# Patient Record
Sex: Male | Born: 1935
Health system: Southern US, Community
[De-identification: ages and names within clinical notes are randomized; demographics above are authoritative.]

## PROBLEM LIST (undated history)

## (undated) DIAGNOSIS — D509 Iron deficiency anemia, unspecified: Secondary | ICD-10-CM

## (undated) DIAGNOSIS — N400 Enlarged prostate without lower urinary tract symptoms: Secondary | ICD-10-CM

## (undated) DIAGNOSIS — T148XXA Other injury of unspecified body region, initial encounter: Secondary | ICD-10-CM

## (undated) DIAGNOSIS — N183 Chronic kidney disease, stage 3 unspecified: Secondary | ICD-10-CM

## (undated) DIAGNOSIS — E7800 Pure hypercholesterolemia, unspecified: Secondary | ICD-10-CM

## (undated) DIAGNOSIS — Z87442 Personal history of urinary calculi: Secondary | ICD-10-CM

## (undated) DIAGNOSIS — S46912A Strain of unspecified muscle, fascia and tendon at shoulder and upper arm level, left arm, initial encounter: Secondary | ICD-10-CM

## (undated) DIAGNOSIS — N2581 Secondary hyperparathyroidism of renal origin: Secondary | ICD-10-CM

## (undated) DIAGNOSIS — I10 Essential (primary) hypertension: Secondary | ICD-10-CM

---

## 1999-03-03 ENCOUNTER — Ambulatory Visit (HOSPITAL_COMMUNITY): Admission: RE | Admit: 1999-03-03 | Discharge: 1999-03-03 | Payer: Self-pay | Admitting: Gastroenterology

## 2002-01-09 ENCOUNTER — Encounter: Payer: Self-pay | Admitting: Urology

## 2002-01-09 ENCOUNTER — Encounter: Admission: RE | Admit: 2002-01-09 | Discharge: 2002-01-09 | Payer: Self-pay | Admitting: Urology

## 2002-03-20 ENCOUNTER — Encounter: Admission: RE | Admit: 2002-03-20 | Discharge: 2002-03-20 | Payer: Self-pay | Admitting: Gastroenterology

## 2002-03-20 ENCOUNTER — Encounter: Payer: Self-pay | Admitting: Gastroenterology

## 2003-02-07 ENCOUNTER — Ambulatory Visit (HOSPITAL_BASED_OUTPATIENT_CLINIC_OR_DEPARTMENT_OTHER): Admission: RE | Admit: 2003-02-07 | Discharge: 2003-02-07 | Payer: Self-pay | Admitting: Urology

## 2003-02-07 HISTORY — PX: OTHER SURGICAL HISTORY: SHX169

## 2003-02-14 ENCOUNTER — Ambulatory Visit (HOSPITAL_BASED_OUTPATIENT_CLINIC_OR_DEPARTMENT_OTHER): Admission: RE | Admit: 2003-02-14 | Discharge: 2003-02-14 | Payer: Self-pay | Admitting: Urology

## 2003-02-14 HISTORY — PX: OTHER SURGICAL HISTORY: SHX169

## 2003-02-20 ENCOUNTER — Ambulatory Visit (HOSPITAL_COMMUNITY): Admission: RE | Admit: 2003-02-20 | Discharge: 2003-02-20 | Payer: Self-pay | Admitting: Urology

## 2003-02-20 HISTORY — PX: OTHER SURGICAL HISTORY: SHX169

## 2005-03-29 ENCOUNTER — Encounter: Admission: RE | Admit: 2005-03-29 | Discharge: 2005-03-29 | Payer: Self-pay | Admitting: Plastic Surgery

## 2005-04-02 ENCOUNTER — Ambulatory Visit (HOSPITAL_BASED_OUTPATIENT_CLINIC_OR_DEPARTMENT_OTHER): Admission: RE | Admit: 2005-04-02 | Discharge: 2005-04-02 | Payer: Self-pay | Admitting: Plastic Surgery

## 2005-04-02 ENCOUNTER — Ambulatory Visit (HOSPITAL_COMMUNITY): Admission: RE | Admit: 2005-04-02 | Discharge: 2005-04-02 | Payer: Self-pay | Admitting: Plastic Surgery

## 2005-04-02 HISTORY — PX: BLEPHAROPLASTY: SUR158

## 2006-03-28 ENCOUNTER — Emergency Department (HOSPITAL_COMMUNITY): Admission: EM | Admit: 2006-03-28 | Discharge: 2006-03-28 | Payer: Self-pay | Admitting: Emergency Medicine

## 2006-03-31 ENCOUNTER — Encounter (HOSPITAL_COMMUNITY): Admission: RE | Admit: 2006-03-31 | Discharge: 2006-05-19 | Payer: Self-pay | Admitting: Emergency Medicine

## 2007-12-05 ENCOUNTER — Encounter: Admission: RE | Admit: 2007-12-05 | Discharge: 2007-12-05 | Payer: Self-pay | Admitting: Internal Medicine

## 2011-01-22 NOTE — Op Note (Signed)
NAME:  Samuel [name], Samuel [name]                       ACCOUNT NO.:  [id number]   MEDICAL RECORD NO.:  [id number]                   PATIENT TYPE:  AMB   LOCATION:  [facility]                                 FACILITY:  [facility]   PHYSICIAN:  Synthia Innocent, M.D.               DATE OF BIRTH:  Jun 18, 1930   DATE OF PROCEDURE:  DATE OF DISCHARGE:                                 OPERATIVE REPORT   PREOPERATIVE DIAGNOSIS:  A 7-8 mm mid left ureteral calculus.   POSTOPERATIVE DIAGNOSIS:  A 7-8 mm mid left ureteral calculus.   OPERATION/PROCEDURE:  1. Cystoscopy.  2. Left rigid ureteroscopy.  3. Holmium laser lithotripsy, left ureteral stone.   SURGEON:  Synthia Innocent, M.D.   DESCRIPTION OF PROCEDURE:  The patient was prepped and draped in the dorsal  lithotomy position under LMA anesthesia.  Cystoscopy was performed with the  rigid cystoscope.  He had a normal anterior urethra, some early BPH with  kissing lateral lobes for 1 cm.  The bladder itself was somewhat murky in  appearance from his indwelling stent which we grabbed the end of and then  pulled it out the tip of the penis.  I then passed up a 0.038 guide wire and  using fluoroscopic control, it was positioned in the left renal pelvis.  The  double-J stent was removed.  I then backloaded the wire over the  ureteroscope, short rigid ureteroscope, and dilated the ureter some more and  engaged the distal ureter and the stone was located just above the iliac  vessels.  I then backed out the ureteroscope and passed it back around the  guide wire.  There appeared to be a lot of debris floating around in the  distal ureter, some fine gravel and a few blood clots.  Then using the  holmium laser fiber at 2.5-3 watts, lithotripsy was performed on the stone,  breaking it up into fine enough particles that I could pass spontaneously.  The rest of the urethra looked normal distally.   I then backloaded the guide wire over a cystoscope and then passed up  a 6-  French 26 cm double-J stent.  It was positioned in the left renal pelvis.  The distal end was curled up in the bladder.  The bladder was emptied and  all instruments were removed.  I injected 10 mL of Xylocaine jelly per  urethra for anesthetic purposes as well as a B&O suppository for bladder  spasms.   The plan now is to keep the stent in for about a week and then remove it in  the office anticipating that he should be able to pass the gravel  spontaneously.  Synthia Innocent, M.D.    RFS/MEDQ  D:  02/14/2003  T:  02/14/2003  Job:  [id number]

## 2011-01-22 NOTE — Op Note (Signed)
NAME:  Samuel [name], Samuel [name]             ACCOUNT NO.:  [id number]   MEDICAL RECORD NO.:  [id number]          PATIENT TYPE:  AMB   LOCATION:  [place]                          FACILITY:  [facility]   PHYSICIAN:  Crissie Reese, M.D.     DATE OF BIRTH:  05-12-1930   DATE OF PROCEDURE:  04/02/2005  DATE OF DISCHARGE:                                 OPERATIVE REPORT   PREOPERATIVE DIAGNOSIS:  Blepharochalasis with upper visual field  obstruction.   POSTOPERATIVE DIAGNOSIS:  Blepharochalasis with upper visual field  obstruction.   PROCEDURE:  Upper lid blepharoplasty.   SURGEON:  Crissie Reese, M.D.   ANESTHESIA:  MAC with 1% Xylocaine with epinephrine and bicarbonate as a  local anesthetic.   INDICATIONS FOR PROCEDURE:  This 77 year old man has had upper field deficit  documented by his ophthalmologist secondary to excess skin of the upper  lids.  An upper lid blepharoplasty was recommended.  The nature of the  procedure and the possible complications were discussed with him in detail.  He understood those risks and possible complications and wished to proceed.   DESCRIPTION OF PROCEDURE:  The patient was taken to the operating room and  placed supine.  Under sedation he was prepped with Betadine and draped with  sterile drapes.  The incisions were marked staying 7 mm above the lid  margin, and carefully measured for the amount to be removed, using a forceps  to ensure that too much skin was not removed.  After these markings had been  carefully placed, they were rechecked again with a forceps grasping the  redundant skin and good eye closure was noted.  The successful local  anesthesia was achieved and the skin was resected and a thin strip of  orbicularis oculi muscle resected along the superior margin of the incision,  taking great care to stay well away from the inferior margin.  The medial  and middle fat pads were identified and were amputated using electrocautery  with the fat pads elevated  up out of the wound, in order to avoid any damage  to any deep structures.  Meticulous hemostasis was then confirmed using  electrocautery, and it was irrigated thoroughly with BSF solution.  Hemostasis having been confirmed, the closure was with #6-0 Prolene simple  running suture.  Antibiotic ophthalmic ointment and iced eye pads were  applied.   He was transported to the recovery room in stable condition.  He tolerated  the condition well.   DISPOSITION:  We will recheck him next week in the office.      Crissie Reese, M.D.  Electronically Signed     DB/MEDQ  D:  04/02/2005  T:  04/02/2005  Job:  [id number]

## 2011-01-22 NOTE — Op Note (Signed)
NAME:  Samuel [name], SCHIMPF                       ACCOUNT NO.:  [id number]   MEDICAL RECORD NO.:  [id number]                   PATIENT TYPE:  AMB   LOCATION:  DAY                                  FACILITY:  [facility]   PHYSICIAN:  Synthia Innocent, M.D.               DATE OF BIRTH:  08/03/1930   DATE OF PROCEDURE:  02/20/2003  DATE OF DISCHARGE:                                 OPERATIVE REPORT   PREOPERATIVE DIAGNOSES:  1. A 6 mm right ureteral calculus with renal colic.  2. Status post left ureteroscopy and holmium, left ureteral stone and double-     J stent.   POSTOPERATIVE DIAGNOSES:  1. A 6 mm right ureteral calculus with renal colic.  2. Status post left ureteroscopy and holmium, left ureteral stone and double-     J stent.   OPERATION/PROCEDURE:  1. Cystoscopy with balloon dilation, distal right ureter with rigid     ureteroscopy.  2. Holmium laser lithotripsy.  3. Right ureteral calculus and insertion of a 6-French, 26 cm double-J     stent.  4. Removal of left ureteral stent.   SURGEON:  Synthia Innocent, M.D.   DESCRIPTION OF PROCEDURE:  The patient was prepped and draped in the dorsal  lithotomy position under LMA anesthesia.  Cystoscopy was performed the 22-  French rigid cystoscope in normal anterior urethra, trilobar hypertrophy,  normal bladder except for an indwelling left ureteral stent.   Initially I passed up a 0.038 guide wire through a 6-French open-ended  ureteral catheter using fluoroscopic control.  A retrograde study was  performed outlining the collecting system.  I positioned the guide wire in  the kidney, backed out the open-ended catheter and then using a 4 cm  ureteral balloon dilator dilated to nine atmospheres pressure over about  five minutes.  We then backed out the balloon dilator and alongside the  guide wire under direct vision, we passed a 6.5 short rigid ureteroscope  under direct vision.  The stone was located just right off the iliac  vessels.  Then using the 375 microfiber and holmium laser, we broke up the  stone to fine particles.  After I thought we had pulverized the stone enough  where I could pass the gravel, I then backloaded the guide wire over a  cystoscope and under direct vision passed a 6-French, 26 cm double-J stent.  It was positioned in the kidney.  The distal end was curled up in the  bladder using fluoroscopic control.  I then removed the left ureteral double-  J stent without incident.  I emptied the bladder.  A B&O suppository was  placed per rectum for anesthetic purposes and the patient was then taken to  back to the recovery room in satisfactory condition.  During the procedure,  appropriate pictures were taken with fluoroscopy screen and the video  camera.  Synthia Innocent, M.D.    RFS/MEDQ  D:  02/20/2003  T:  02/20/2003  Job:  [id number]

## 2011-01-22 NOTE — Op Note (Signed)
   NAME:  Samuel [name], Samuel [name]                       ACCOUNT NO.:  [id number]   MEDICAL RECORD NO.:  [id number]                   PATIENT TYPE:  AMB   LOCATION:  [facility]                                 FACILITY:  [facility]   PHYSICIAN:  Synthia Innocent, M.D.               DATE OF BIRTH:  [birth year]   DATE OF PROCEDURE:  02/07/2003  DATE OF DISCHARGE:                                 OPERATIVE REPORT   PREOPERATIVE DIAGNOSIS:  Two left ureteral calculi with renal colic and  hydronephrosis.   POSTOPERATIVE DIAGNOSIS:  Two left ureteral calculi with renal colic and  hydronephrosis.   OPERATION:  Cystoscopy and insertion of a 6 [place], 26 cm double J stent.   SURGEON:  Synthia Innocent, M.D.   DESCRIPTION OF PROCEDURE:  The patient was prepped and draped in the dorsal  lithotomy position under LMA anesthesia. Cystoscopy was performed with a 22  French rigid cystoscope and 12 degree lens. He had a normal anterior  urethra, trilobar hypertrophy with a rather prominent median lobe,  otherwise, a normal smooth bladder, no tumors. There appeared to be some  fine gravel floating around the bladder. The orifices themselves were  normal.   Initially after using the 6 French open ended ureteral catheter and a 0.038  Glidewire, I was able to negotiate by two known distal left ureteral stones.  There was a 4 mm stone on CT scan yesterday and a 7 mm stone over the iliac  vessels. However, because of some difficulty negotiating the Glidewire by  these areas once I got the Glidewire up to the kidney and rather than risk  loosing accessing, I just went ahead and put up a 6 French 26 cm double J  stent using fluoroscopic control. The proximal end was curled up in the  kidney, the distal end was curled up in the bladder. The bladder was  emptied, all instruments were removed and I put in 1% Xylocaine jelly per  urethra for anesthetic purposes. The patient was then taken back to the  recovery room in  satisfactory condition.   The plan now is to perform lithotripsy if we can see the stone next week, if  not I would bring him back in about a week once the ureter has dilated and  try to remove both stones at the same time with the ureteroscope. The  patient understands the proposed surgery.                                               Synthia Innocent, M.D.    RFS/MEDQ  D:  02/07/2003  T:  02/07/2003  Job:  [id number]

## 2011-10-19 DIAGNOSIS — D509 Iron deficiency anemia, unspecified: Secondary | ICD-10-CM | POA: Diagnosis not present

## 2011-10-19 DIAGNOSIS — N2581 Secondary hyperparathyroidism of renal origin: Secondary | ICD-10-CM | POA: Diagnosis not present

## 2011-10-19 DIAGNOSIS — I10 Essential (primary) hypertension: Secondary | ICD-10-CM | POA: Diagnosis not present

## 2011-12-23 DIAGNOSIS — Z961 Presence of intraocular lens: Secondary | ICD-10-CM | POA: Diagnosis not present

## 2011-12-23 DIAGNOSIS — E109 Type 1 diabetes mellitus without complications: Secondary | ICD-10-CM | POA: Diagnosis not present

## 2012-01-10 DIAGNOSIS — D509 Iron deficiency anemia, unspecified: Secondary | ICD-10-CM | POA: Diagnosis not present

## 2012-01-10 DIAGNOSIS — N2581 Secondary hyperparathyroidism of renal origin: Secondary | ICD-10-CM | POA: Diagnosis not present

## 2012-01-10 DIAGNOSIS — I10 Essential (primary) hypertension: Secondary | ICD-10-CM | POA: Diagnosis not present

## 2012-01-19 DIAGNOSIS — D509 Iron deficiency anemia, unspecified: Secondary | ICD-10-CM | POA: Diagnosis not present

## 2012-01-19 DIAGNOSIS — N2581 Secondary hyperparathyroidism of renal origin: Secondary | ICD-10-CM | POA: Diagnosis not present

## 2012-01-19 DIAGNOSIS — I10 Essential (primary) hypertension: Secondary | ICD-10-CM | POA: Diagnosis not present

## 2012-02-02 DIAGNOSIS — I10 Essential (primary) hypertension: Secondary | ICD-10-CM | POA: Diagnosis not present

## 2012-02-02 DIAGNOSIS — E785 Hyperlipidemia, unspecified: Secondary | ICD-10-CM | POA: Diagnosis not present

## 2012-02-02 DIAGNOSIS — E1129 Type 2 diabetes mellitus with other diabetic kidney complication: Secondary | ICD-10-CM | POA: Diagnosis not present

## 2012-02-02 DIAGNOSIS — Z125 Encounter for screening for malignant neoplasm of prostate: Secondary | ICD-10-CM | POA: Diagnosis not present

## 2012-02-07 DIAGNOSIS — I10 Essential (primary) hypertension: Secondary | ICD-10-CM | POA: Diagnosis not present

## 2012-02-07 DIAGNOSIS — Z0000 Encounter for general adult medical examination without abnormal findings: Secondary | ICD-10-CM | POA: Diagnosis not present

## 2012-02-07 DIAGNOSIS — N039 Chronic nephritic syndrome with unspecified morphologic changes: Secondary | ICD-10-CM | POA: Diagnosis not present

## 2012-02-07 DIAGNOSIS — E1129 Type 2 diabetes mellitus with other diabetic kidney complication: Secondary | ICD-10-CM | POA: Diagnosis not present

## 2012-02-07 DIAGNOSIS — Z125 Encounter for screening for malignant neoplasm of prostate: Secondary | ICD-10-CM | POA: Diagnosis not present

## 2012-02-07 DIAGNOSIS — E785 Hyperlipidemia, unspecified: Secondary | ICD-10-CM | POA: Diagnosis not present

## 2012-02-09 DIAGNOSIS — Z1212 Encounter for screening for malignant neoplasm of rectum: Secondary | ICD-10-CM | POA: Diagnosis not present

## 2012-03-15 DIAGNOSIS — N529 Male erectile dysfunction, unspecified: Secondary | ICD-10-CM | POA: Diagnosis not present

## 2012-03-30 ENCOUNTER — Encounter (HOSPITAL_COMMUNITY): Payer: Self-pay | Admitting: *Deleted

## 2012-03-30 ENCOUNTER — Emergency Department (HOSPITAL_COMMUNITY): Payer: No Typology Code available for payment source

## 2012-03-30 ENCOUNTER — Emergency Department (HOSPITAL_COMMUNITY)
Admission: EM | Admit: 2012-03-30 | Discharge: 2012-03-30 | Disposition: A | Discharge status: Home / Self Care | Payer: No Typology Code available for payment source | Attending: Emergency Medicine | Admitting: Emergency Medicine

## 2012-03-30 DIAGNOSIS — E7800 Pure hypercholesterolemia, unspecified: Secondary | ICD-10-CM | POA: Insufficient documentation

## 2012-03-30 DIAGNOSIS — S39012A Strain of muscle, fascia and tendon of lower back, initial encounter: Secondary | ICD-10-CM

## 2012-03-30 DIAGNOSIS — Z87891 Personal history of nicotine dependence: Secondary | ICD-10-CM | POA: Insufficient documentation

## 2012-03-30 DIAGNOSIS — Y998 Other external cause status: Secondary | ICD-10-CM | POA: Insufficient documentation

## 2012-03-30 DIAGNOSIS — S335XXA Sprain of ligaments of lumbar spine, initial encounter: Secondary | ICD-10-CM | POA: Diagnosis not present

## 2012-03-30 DIAGNOSIS — Y93I9 Activity, other involving external motion: Secondary | ICD-10-CM | POA: Insufficient documentation

## 2012-03-30 DIAGNOSIS — I10 Essential (primary) hypertension: Secondary | ICD-10-CM | POA: Insufficient documentation

## 2012-03-30 DIAGNOSIS — M549 Dorsalgia, unspecified: Secondary | ICD-10-CM | POA: Diagnosis not present

## 2012-03-30 DIAGNOSIS — E119 Type 2 diabetes mellitus without complications: Secondary | ICD-10-CM | POA: Insufficient documentation

## 2012-03-30 HISTORY — DX: Pure hypercholesterolemia, unspecified: E78.00

## 2012-03-30 HISTORY — DX: Essential (primary) hypertension: I10

## 2012-03-30 MED ORDER — TRAMADOL HCL 50 MG PO TABS
50.0000 mg | ORAL_TABLET | Freq: Four times a day (QID) | ORAL | Status: AC | PRN
  Administered 2012-03-30: 50 mg via ORAL
  Filled 2012-03-30: qty 1

## 2012-03-30 MED ORDER — TRAMADOL HCL 50 MG PO TABS: 50.0000 mg | ORAL_TABLET | Freq: Four times a day (QID) | ORAL | Status: AC | PRN

## 2012-03-30 NOTE — ED Notes (Signed)
Patient transported to X-ray 
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
## 2012-03-30 NOTE — ED Notes (Signed)
Pt. returned from XR. 

## 2012-04-04 DIAGNOSIS — R39198 Other difficulties with micturition: Secondary | ICD-10-CM | POA: Diagnosis not present

## 2012-04-04 DIAGNOSIS — N3941 Urge incontinence: Secondary | ICD-10-CM | POA: Diagnosis not present

## 2012-04-26 DIAGNOSIS — N401 Enlarged prostate with lower urinary tract symptoms: Secondary | ICD-10-CM | POA: Diagnosis not present

## 2012-04-26 DIAGNOSIS — N529 Male erectile dysfunction, unspecified: Secondary | ICD-10-CM | POA: Diagnosis not present

## 2012-06-07 DIAGNOSIS — Z23 Encounter for immunization: Secondary | ICD-10-CM | POA: Diagnosis not present

## 2012-06-09 DIAGNOSIS — M7500 Adhesive capsulitis of unspecified shoulder: Secondary | ICD-10-CM | POA: Diagnosis not present

## 2012-06-09 DIAGNOSIS — M545 Low back pain: Secondary | ICD-10-CM | POA: Diagnosis not present

## 2012-06-09 DIAGNOSIS — M542 Cervicalgia: Secondary | ICD-10-CM | POA: Diagnosis not present

## 2012-06-19 DIAGNOSIS — M47812 Spondylosis without myelopathy or radiculopathy, cervical region: Secondary | ICD-10-CM | POA: Diagnosis not present

## 2012-06-19 DIAGNOSIS — M19019 Primary osteoarthritis, unspecified shoulder: Secondary | ICD-10-CM | POA: Diagnosis not present

## 2012-06-23 DIAGNOSIS — M545 Low back pain: Secondary | ICD-10-CM | POA: Diagnosis not present

## 2012-06-23 DIAGNOSIS — M7500 Adhesive capsulitis of unspecified shoulder: Secondary | ICD-10-CM | POA: Diagnosis not present

## 2012-06-23 DIAGNOSIS — M542 Cervicalgia: Secondary | ICD-10-CM | POA: Diagnosis not present

## 2012-07-17 DIAGNOSIS — M109 Gout, unspecified: Secondary | ICD-10-CM | POA: Diagnosis not present

## 2012-07-17 DIAGNOSIS — E1129 Type 2 diabetes mellitus with other diabetic kidney complication: Secondary | ICD-10-CM | POA: Diagnosis not present

## 2012-07-17 DIAGNOSIS — M25579 Pain in unspecified ankle and joints of unspecified foot: Secondary | ICD-10-CM | POA: Diagnosis not present

## 2012-07-27 DIAGNOSIS — M7500 Adhesive capsulitis of unspecified shoulder: Secondary | ICD-10-CM | POA: Diagnosis not present

## 2012-07-27 DIAGNOSIS — M542 Cervicalgia: Secondary | ICD-10-CM | POA: Diagnosis not present

## 2012-08-09 DIAGNOSIS — N2581 Secondary hyperparathyroidism of renal origin: Secondary | ICD-10-CM | POA: Diagnosis not present

## 2012-08-09 DIAGNOSIS — D509 Iron deficiency anemia, unspecified: Secondary | ICD-10-CM | POA: Diagnosis not present

## 2012-08-09 DIAGNOSIS — I10 Essential (primary) hypertension: Secondary | ICD-10-CM | POA: Diagnosis not present

## 2012-08-14 DIAGNOSIS — E1129 Type 2 diabetes mellitus with other diabetic kidney complication: Secondary | ICD-10-CM | POA: Diagnosis not present

## 2012-08-14 DIAGNOSIS — M109 Gout, unspecified: Secondary | ICD-10-CM | POA: Diagnosis not present

## 2012-08-14 DIAGNOSIS — Z1331 Encounter for screening for depression: Secondary | ICD-10-CM | POA: Diagnosis not present

## 2012-08-14 DIAGNOSIS — E11319 Type 2 diabetes mellitus with unspecified diabetic retinopathy without macular edema: Secondary | ICD-10-CM | POA: Diagnosis not present

## 2012-08-14 DIAGNOSIS — D631 Anemia in chronic kidney disease: Secondary | ICD-10-CM | POA: Diagnosis not present

## 2012-08-14 DIAGNOSIS — E785 Hyperlipidemia, unspecified: Secondary | ICD-10-CM | POA: Diagnosis not present

## 2012-08-14 DIAGNOSIS — N039 Chronic nephritic syndrome with unspecified morphologic changes: Secondary | ICD-10-CM | POA: Diagnosis not present

## 2012-08-14 DIAGNOSIS — I10 Essential (primary) hypertension: Secondary | ICD-10-CM | POA: Diagnosis not present

## 2012-08-15 DIAGNOSIS — M999 Biomechanical lesion, unspecified: Secondary | ICD-10-CM | POA: Diagnosis not present

## 2012-08-15 DIAGNOSIS — M542 Cervicalgia: Secondary | ICD-10-CM | POA: Diagnosis not present

## 2012-08-15 DIAGNOSIS — M546 Pain in thoracic spine: Secondary | ICD-10-CM | POA: Diagnosis not present

## 2012-08-15 DIAGNOSIS — M9981 Other biomechanical lesions of cervical region: Secondary | ICD-10-CM | POA: Diagnosis not present

## 2012-08-22 DIAGNOSIS — M999 Biomechanical lesion, unspecified: Secondary | ICD-10-CM | POA: Diagnosis not present

## 2012-08-22 DIAGNOSIS — M9981 Other biomechanical lesions of cervical region: Secondary | ICD-10-CM | POA: Diagnosis not present

## 2012-08-22 DIAGNOSIS — S139XXA Sprain of joints and ligaments of unspecified parts of neck, initial encounter: Secondary | ICD-10-CM | POA: Diagnosis not present

## 2012-08-22 DIAGNOSIS — S335XXA Sprain of ligaments of lumbar spine, initial encounter: Secondary | ICD-10-CM | POA: Diagnosis not present

## 2012-08-28 DIAGNOSIS — M9981 Other biomechanical lesions of cervical region: Secondary | ICD-10-CM | POA: Diagnosis not present

## 2012-08-28 DIAGNOSIS — M999 Biomechanical lesion, unspecified: Secondary | ICD-10-CM | POA: Diagnosis not present

## 2012-08-28 DIAGNOSIS — S335XXA Sprain of ligaments of lumbar spine, initial encounter: Secondary | ICD-10-CM | POA: Diagnosis not present

## 2012-08-28 DIAGNOSIS — S139XXA Sprain of joints and ligaments of unspecified parts of neck, initial encounter: Secondary | ICD-10-CM | POA: Diagnosis not present

## 2012-09-04 DIAGNOSIS — S335XXA Sprain of ligaments of lumbar spine, initial encounter: Secondary | ICD-10-CM | POA: Diagnosis not present

## 2012-09-04 DIAGNOSIS — M9981 Other biomechanical lesions of cervical region: Secondary | ICD-10-CM | POA: Diagnosis not present

## 2012-09-04 DIAGNOSIS — S139XXA Sprain of joints and ligaments of unspecified parts of neck, initial encounter: Secondary | ICD-10-CM | POA: Diagnosis not present

## 2012-09-04 DIAGNOSIS — M999 Biomechanical lesion, unspecified: Secondary | ICD-10-CM | POA: Diagnosis not present

## 2012-09-07 DIAGNOSIS — M9981 Other biomechanical lesions of cervical region: Secondary | ICD-10-CM | POA: Diagnosis not present

## 2012-09-07 DIAGNOSIS — S335XXA Sprain of ligaments of lumbar spine, initial encounter: Secondary | ICD-10-CM | POA: Diagnosis not present

## 2012-09-07 DIAGNOSIS — M999 Biomechanical lesion, unspecified: Secondary | ICD-10-CM | POA: Diagnosis not present

## 2012-09-07 DIAGNOSIS — S139XXA Sprain of joints and ligaments of unspecified parts of neck, initial encounter: Secondary | ICD-10-CM | POA: Diagnosis not present

## 2012-09-12 DIAGNOSIS — S335XXA Sprain of ligaments of lumbar spine, initial encounter: Secondary | ICD-10-CM | POA: Diagnosis not present

## 2012-09-12 DIAGNOSIS — M9981 Other biomechanical lesions of cervical region: Secondary | ICD-10-CM | POA: Diagnosis not present

## 2012-09-12 DIAGNOSIS — S139XXA Sprain of joints and ligaments of unspecified parts of neck, initial encounter: Secondary | ICD-10-CM | POA: Diagnosis not present

## 2012-09-12 DIAGNOSIS — M999 Biomechanical lesion, unspecified: Secondary | ICD-10-CM | POA: Diagnosis not present

## 2012-09-14 DIAGNOSIS — M9981 Other biomechanical lesions of cervical region: Secondary | ICD-10-CM | POA: Diagnosis not present

## 2012-09-14 DIAGNOSIS — S139XXA Sprain of joints and ligaments of unspecified parts of neck, initial encounter: Secondary | ICD-10-CM | POA: Diagnosis not present

## 2012-09-14 DIAGNOSIS — S335XXA Sprain of ligaments of lumbar spine, initial encounter: Secondary | ICD-10-CM | POA: Diagnosis not present

## 2012-09-14 DIAGNOSIS — M999 Biomechanical lesion, unspecified: Secondary | ICD-10-CM | POA: Diagnosis not present

## 2012-09-19 DIAGNOSIS — S335XXA Sprain of ligaments of lumbar spine, initial encounter: Secondary | ICD-10-CM | POA: Diagnosis not present

## 2012-09-19 DIAGNOSIS — M9981 Other biomechanical lesions of cervical region: Secondary | ICD-10-CM | POA: Diagnosis not present

## 2012-09-19 DIAGNOSIS — M999 Biomechanical lesion, unspecified: Secondary | ICD-10-CM | POA: Diagnosis not present

## 2012-09-19 DIAGNOSIS — S139XXA Sprain of joints and ligaments of unspecified parts of neck, initial encounter: Secondary | ICD-10-CM | POA: Diagnosis not present

## 2012-09-21 DIAGNOSIS — M9981 Other biomechanical lesions of cervical region: Secondary | ICD-10-CM | POA: Diagnosis not present

## 2012-09-21 DIAGNOSIS — M999 Biomechanical lesion, unspecified: Secondary | ICD-10-CM | POA: Diagnosis not present

## 2012-09-21 DIAGNOSIS — S335XXA Sprain of ligaments of lumbar spine, initial encounter: Secondary | ICD-10-CM | POA: Diagnosis not present

## 2012-09-21 DIAGNOSIS — S139XXA Sprain of joints and ligaments of unspecified parts of neck, initial encounter: Secondary | ICD-10-CM | POA: Diagnosis not present

## 2012-09-26 DIAGNOSIS — M9981 Other biomechanical lesions of cervical region: Secondary | ICD-10-CM | POA: Diagnosis not present

## 2012-09-26 DIAGNOSIS — S139XXA Sprain of joints and ligaments of unspecified parts of neck, initial encounter: Secondary | ICD-10-CM | POA: Diagnosis not present

## 2012-09-26 DIAGNOSIS — S335XXA Sprain of ligaments of lumbar spine, initial encounter: Secondary | ICD-10-CM | POA: Diagnosis not present

## 2012-09-26 DIAGNOSIS — M999 Biomechanical lesion, unspecified: Secondary | ICD-10-CM | POA: Diagnosis not present

## 2012-09-28 DIAGNOSIS — M999 Biomechanical lesion, unspecified: Secondary | ICD-10-CM | POA: Diagnosis not present

## 2012-09-28 DIAGNOSIS — S335XXA Sprain of ligaments of lumbar spine, initial encounter: Secondary | ICD-10-CM | POA: Diagnosis not present

## 2012-09-28 DIAGNOSIS — S139XXA Sprain of joints and ligaments of unspecified parts of neck, initial encounter: Secondary | ICD-10-CM | POA: Diagnosis not present

## 2012-09-28 DIAGNOSIS — M9981 Other biomechanical lesions of cervical region: Secondary | ICD-10-CM | POA: Diagnosis not present

## 2012-10-10 DIAGNOSIS — M999 Biomechanical lesion, unspecified: Secondary | ICD-10-CM | POA: Diagnosis not present

## 2012-10-10 DIAGNOSIS — S139XXA Sprain of joints and ligaments of unspecified parts of neck, initial encounter: Secondary | ICD-10-CM | POA: Diagnosis not present

## 2012-10-10 DIAGNOSIS — M9981 Other biomechanical lesions of cervical region: Secondary | ICD-10-CM | POA: Diagnosis not present

## 2012-10-10 DIAGNOSIS — S335XXA Sprain of ligaments of lumbar spine, initial encounter: Secondary | ICD-10-CM | POA: Diagnosis not present

## 2012-10-12 DIAGNOSIS — S335XXA Sprain of ligaments of lumbar spine, initial encounter: Secondary | ICD-10-CM | POA: Diagnosis not present

## 2012-10-12 DIAGNOSIS — M9981 Other biomechanical lesions of cervical region: Secondary | ICD-10-CM | POA: Diagnosis not present

## 2012-10-12 DIAGNOSIS — M999 Biomechanical lesion, unspecified: Secondary | ICD-10-CM | POA: Diagnosis not present

## 2012-10-12 DIAGNOSIS — S139XXA Sprain of joints and ligaments of unspecified parts of neck, initial encounter: Secondary | ICD-10-CM | POA: Diagnosis not present

## 2012-10-20 DIAGNOSIS — M999 Biomechanical lesion, unspecified: Secondary | ICD-10-CM | POA: Diagnosis not present

## 2012-10-20 DIAGNOSIS — M9981 Other biomechanical lesions of cervical region: Secondary | ICD-10-CM | POA: Diagnosis not present

## 2012-10-20 DIAGNOSIS — S335XXA Sprain of ligaments of lumbar spine, initial encounter: Secondary | ICD-10-CM | POA: Diagnosis not present

## 2012-10-20 DIAGNOSIS — S139XXA Sprain of joints and ligaments of unspecified parts of neck, initial encounter: Secondary | ICD-10-CM | POA: Diagnosis not present

## 2012-10-25 DIAGNOSIS — N401 Enlarged prostate with lower urinary tract symptoms: Secondary | ICD-10-CM | POA: Diagnosis not present

## 2012-10-25 DIAGNOSIS — M999 Biomechanical lesion, unspecified: Secondary | ICD-10-CM | POA: Diagnosis not present

## 2012-10-25 DIAGNOSIS — M542 Cervicalgia: Secondary | ICD-10-CM | POA: Diagnosis not present

## 2012-10-25 DIAGNOSIS — M9981 Other biomechanical lesions of cervical region: Secondary | ICD-10-CM | POA: Diagnosis not present

## 2012-10-25 DIAGNOSIS — N320 Bladder-neck obstruction: Secondary | ICD-10-CM | POA: Diagnosis not present

## 2012-10-25 DIAGNOSIS — I10 Essential (primary) hypertension: Secondary | ICD-10-CM | POA: Diagnosis not present

## 2012-10-27 ENCOUNTER — Other Ambulatory Visit: Payer: Self-pay | Admitting: Urology

## 2012-11-01 DIAGNOSIS — M999 Biomechanical lesion, unspecified: Secondary | ICD-10-CM | POA: Diagnosis not present

## 2012-11-01 DIAGNOSIS — S139XXA Sprain of joints and ligaments of unspecified parts of neck, initial encounter: Secondary | ICD-10-CM | POA: Diagnosis not present

## 2012-11-01 DIAGNOSIS — S335XXA Sprain of ligaments of lumbar spine, initial encounter: Secondary | ICD-10-CM | POA: Diagnosis not present

## 2012-11-01 DIAGNOSIS — M9981 Other biomechanical lesions of cervical region: Secondary | ICD-10-CM | POA: Diagnosis not present

## 2012-11-06 ENCOUNTER — Emergency Department (HOSPITAL_COMMUNITY): Payer: Medicare Other

## 2012-11-06 ENCOUNTER — Encounter (HOSPITAL_COMMUNITY): Payer: Self-pay | Admitting: Emergency Medicine

## 2012-11-06 ENCOUNTER — Emergency Department (HOSPITAL_COMMUNITY)
Admission: EM | Admit: 2012-11-06 | Discharge: 2012-11-06 | Disposition: A | Discharge status: Home / Self Care | Payer: Medicare Other | Attending: Emergency Medicine | Admitting: Emergency Medicine

## 2012-11-06 DIAGNOSIS — N289 Disorder of kidney and ureter, unspecified: Secondary | ICD-10-CM | POA: Insufficient documentation

## 2012-11-06 DIAGNOSIS — I10 Essential (primary) hypertension: Secondary | ICD-10-CM | POA: Diagnosis not present

## 2012-11-06 DIAGNOSIS — S46912A Strain of unspecified muscle, fascia and tendon at shoulder and upper arm level, left arm, initial encounter: Secondary | ICD-10-CM

## 2012-11-06 DIAGNOSIS — E119 Type 2 diabetes mellitus without complications: Secondary | ICD-10-CM | POA: Diagnosis not present

## 2012-11-06 DIAGNOSIS — W108XXA Fall (on) (from) other stairs and steps, initial encounter: Secondary | ICD-10-CM | POA: Insufficient documentation

## 2012-11-06 DIAGNOSIS — Z7982 Long term (current) use of aspirin: Secondary | ICD-10-CM | POA: Diagnosis not present

## 2012-11-06 DIAGNOSIS — M542 Cervicalgia: Secondary | ICD-10-CM | POA: Diagnosis not present

## 2012-11-06 DIAGNOSIS — R079 Chest pain, unspecified: Secondary | ICD-10-CM | POA: Insufficient documentation

## 2012-11-06 DIAGNOSIS — Y92009 Unspecified place in unspecified non-institutional (private) residence as the place of occurrence of the external cause: Secondary | ICD-10-CM | POA: Insufficient documentation

## 2012-11-06 DIAGNOSIS — Z794 Long term (current) use of insulin: Secondary | ICD-10-CM | POA: Insufficient documentation

## 2012-11-06 DIAGNOSIS — S0993XA Unspecified injury of face, initial encounter: Secondary | ICD-10-CM | POA: Diagnosis not present

## 2012-11-06 DIAGNOSIS — S298XXA Other specified injuries of thorax, initial encounter: Secondary | ICD-10-CM | POA: Diagnosis not present

## 2012-11-06 DIAGNOSIS — E7800 Pure hypercholesterolemia, unspecified: Secondary | ICD-10-CM | POA: Diagnosis not present

## 2012-11-06 DIAGNOSIS — Z87891 Personal history of nicotine dependence: Secondary | ICD-10-CM | POA: Insufficient documentation

## 2012-11-06 DIAGNOSIS — S4980XA Other specified injuries of shoulder and upper arm, unspecified arm, initial encounter: Secondary | ICD-10-CM | POA: Diagnosis not present

## 2012-11-06 DIAGNOSIS — S0990XA Unspecified injury of head, initial encounter: Secondary | ICD-10-CM | POA: Diagnosis not present

## 2012-11-06 DIAGNOSIS — S199XXA Unspecified injury of neck, initial encounter: Secondary | ICD-10-CM | POA: Diagnosis not present

## 2012-11-06 DIAGNOSIS — W19XXXA Unspecified fall, initial encounter: Secondary | ICD-10-CM

## 2012-11-06 DIAGNOSIS — M25519 Pain in unspecified shoulder: Secondary | ICD-10-CM | POA: Diagnosis not present

## 2012-11-06 DIAGNOSIS — IMO0002 Reserved for concepts with insufficient information to code with codable children: Secondary | ICD-10-CM | POA: Insufficient documentation

## 2012-11-06 DIAGNOSIS — W010XXA Fall on same level from slipping, tripping and stumbling without subsequent striking against object, initial encounter: Secondary | ICD-10-CM | POA: Insufficient documentation

## 2012-11-06 DIAGNOSIS — R51 Headache: Secondary | ICD-10-CM | POA: Diagnosis not present

## 2012-11-06 DIAGNOSIS — Z79899 Other long term (current) drug therapy: Secondary | ICD-10-CM | POA: Insufficient documentation

## 2012-11-06 DIAGNOSIS — Y9389 Activity, other specified: Secondary | ICD-10-CM | POA: Insufficient documentation

## 2012-11-06 LAB — POCT I-STAT, CHEM 8
Creatinine, Ser: 2.2 mg/dL — ABNORMAL HIGH (ref 0.50–1.35)
Hemoglobin: 11.6 g/dL — ABNORMAL LOW (ref 13.0–17.0)
Sodium: 143 mEq/L (ref 135–145)
TCO2: 25 mmol/L (ref 0–100)

## 2012-11-06 MED ORDER — DIAZEPAM 5 MG PO TABS: 5.0000 mg | ORAL_TABLET | Freq: Two times a day (BID) | ORAL | Status: DC | PRN

## 2012-11-06 MED ORDER — FENTANYL CITRATE 0.05 MG/ML IJ SOLN
25.0000 ug | Freq: Once | INTRAMUSCULAR | Status: AC
  Administered 2012-11-06: 25 ug via INTRAVENOUS
  Filled 2012-11-06: qty 2

## 2012-11-06 MED ORDER — HYDROCODONE-ACETAMINOPHEN 5-325 MG PO TABS: 1.0000 | ORAL_TABLET | Freq: Four times a day (QID) | ORAL | Status: DC | PRN

## 2012-11-06 MED ORDER — ONDANSETRON HCL 4 MG/2ML IJ SOLN
4.0000 mg | Freq: Once | INTRAMUSCULAR | Status: AC
  Administered 2012-11-06: 4 mg via INTRAVENOUS
  Filled 2012-11-06: qty 2

## 2012-11-06 NOTE — ED Notes (Signed)
Patient requesting to hold second dose of pain medication as pain has subsided to a level 5 and he feels better.
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
## 2012-11-06 NOTE — ED Notes (Signed)
Patient to xray.

## 2012-11-06 NOTE — ED Provider Notes (Addendum)
History     CSN: [id number]  Arrival date & time 11/06/12  1454   First MD Initiated Contact with Patient 11/06/12 1518      Chief Complaint  Patient presents with  . Fall  . Shoulder Pain    (Consider location/radiation/quality/duration/timing/severity/associated sxs/prior treatment) Patient is a 79 y.o. male presenting with fall and shoulder pain. The history is provided by the patient.  Fall The accident occurred 1 to 2 hours ago. The fall occurred while walking (He was walking down his front porch and slipped on ice causing him to fall down 3 steps). He landed on concrete. The point of impact was the left shoulder. The pain is present in the left shoulder. The pain is at a severity of 9/10. The pain is severe. He was ambulatory at the scene. Pertinent negatives include no visual change, no abdominal pain, no nausea, no vomiting, no headaches, no loss of consciousness and no tingling. Associated symptoms comments: Patient is describing pain in the left side of his back where his ribs attached to his spine. The symptoms are aggravated by activity, use of the injured limb and pressure on the injury. He has tried nothing for the symptoms. The treatment provided no relief.  Shoulder Pain Pertinent negatives include no abdominal pain, no headaches and no shortness of breath.    Past Medical History  Diagnosis Date  . Diabetes mellitus   . Hypertension   . Hypercholesteremia   . Renal disorder     chronic kidney disease    History reviewed. No pertinent past surgical history.  No family history on file.  History  Substance Use Topics  . Smoking status: Former Research scientist (life sciences)  . Smokeless tobacco: Not on file  . Alcohol Use: No      Review of Systems  Respiratory: Negative for cough and shortness of breath.   Gastrointestinal: Negative for nausea, vomiting and abdominal pain.  Neurological: Negative for tingling, loss of consciousness, weakness and headaches.  All other systems  reviewed and are negative.    Allergies  Codeine and Penicillins  Home Medications   Current Outpatient Rx  Name  Route  Sig  Dispense  Refill  . allopurinol (ZYLOPRIM) 100 MG tablet   Oral   Take 100 mg by mouth every evening.         [name] amLODipine (NORVASC) 10 MG tablet   Oral   Take 10 mg by mouth every evening.          [name] aspirin EC 81 MG tablet   Oral   Take 81 mg by mouth every evening.          [name] atorvastatin (LIPITOR) 20 MG tablet   Oral   Take 20 mg by mouth every evening.          . calcitRIOL (ROCALTROL) 0.25 MCG capsule   Oral   Take 0.25 mcg by mouth every Monday, Wednesday, and Friday.          . dutasteride (AVODART) 0.5 MG capsule   Oral   Take 0.5 mg by mouth every morning.          . ferrous sulfate 325 (65 FE) MG tablet   Oral   Take 325 mg by mouth every morning.          . fish oil-omega-3 fatty acids 1000 MG capsule   Oral   Take 1 g by mouth every evening.          . furosemide (LASIX) 20  MG tablet   Oral   Take 20 mg by mouth every morning.          [name] glimepiride (AMARYL) 4 MG tablet   Oral   Take 4 mg by mouth 2 (two) times daily.          . sitaGLIPtin (JANUVIA) 50 MG tablet   Oral   Take 50 mg by mouth every morning.          . tamsulosin (FLOMAX) 0.4 MG CAPS   Oral   Take 0.4 mg by mouth every morning.         [name] telmisartan-hydrochlorothiazide (MICARDIS HCT) 80-12.5 MG per tablet   Oral   Take 1 tablet by mouth every morning.         . traMADol (ULTRAM) 50 MG tablet   Oral   Take 50 mg by mouth every 6 (six) hours as needed (pain).         . vitamin B-12 (CYANOCOBALAMIN) 1000 MCG tablet   Oral   Take 1,000 mcg by mouth every morning.          . insulin glargine (LANTUS SOLOSTAR) 100 UNIT/ML injection   Subcutaneous   Inject 8-16 Units into the skin at bedtime.           BP 141/53  Pulse 67  Temp(Src) 98.4 F (36.9 C) (Oral)  Resp 19  SpO2 100%  Physical Exam  Nursing note  and vitals reviewed. Constitutional: He is oriented to person, place, and time. He appears well-developed and well-nourished. He appears distressed.  HENT:  Head: Normocephalic and atraumatic.  Mouth/Throat: Oropharynx is clear and moist.  Eyes: Conjunctivae and EOM are normal. Pupils are equal, round, and reactive to light.  Neck: Normal range of motion. Neck supple. No spinous process tenderness and no muscular tenderness present.  Cardiovascular: Normal rate, regular rhythm and intact distal pulses.   No murmur heard. Pulmonary/Chest: Effort normal and breath sounds normal. No respiratory distress. He has no wheezes. He has no rales.  Abdominal: Soft. He exhibits no distension. There is no tenderness. There is no rebound and no guarding.  Musculoskeletal: He exhibits no edema and no tenderness.       Left shoulder: He exhibits decreased range of motion, tenderness and pain. He exhibits normal pulse and normal strength.       Right hip: Normal.       Left hip: Normal.       Cervical back: Normal.       Thoracic back: Normal.       Lumbar back: Normal.       Arms: Severe pain in the left scapular region with rotation of the left shoulder  Neurological: He is alert and oriented to person, place, and time.  Skin: Skin is warm and dry. No rash noted. No erythema.  Psychiatric: He has a normal mood and affect. His behavior is normal.    ED Course  Procedures (including critical care time)  Labs Reviewed - No data to display Dg Ribs Unilateral W/chest Left  11/06/2012  *RADIOLOGY REPORT*  Clinical Data: Fall.  Left upper and posterior rib pain.  LEFT RIBS AND CHEST - 3+ VIEW  Comparison: PA and lateral chest 03/29/2005.  Findings: Eventration of the right hemidiaphragm is again seen. Linear atelectasis or scar is noted in the right lung base.  The lungs are otherwise clear.  No pneumothorax or pleural effusion. No fracture is identified.  IMPRESSION: No acute finding.   Original  Report  Authenticated By: Orlean Patten, M.D.    Ct Head Wo Contrast  11/06/2012  *RADIOLOGY REPORT*  Clinical Data:  Fall, head pain, neck pain.  CT HEAD WITHOUT CONTRAST CT CERVICAL SPINE WITHOUT CONTRAST  Technique:  Multidetector CT imaging of the head and cervical spine was performed following the standard protocol without intravenous contrast.  Multiplanar CT image reconstructions of the cervical spine were also generated.  Comparison:   None  CT HEAD  Findings: There is no evidence for acute infarction, intracranial hemorrhage, mass lesion, hydrocephalus, or extra-axial fluid.  Mild atrophy.  Moderate chronic microvascular ischemic change. 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 Calvarium intact.  Vascular calcification.  No sinus or mastoid disease.  IMPRESSION: Atrophy and chronic microvascular ischemic change.  No skull fracture or intracranial hemorrhage.  CT CERVICAL SPINE  Findings: No visible cervical spine fracture or traumatic subluxation.  Advanced disc space narrowing C5-C6, and moderate disc space narrowing C6-C7.  No prevertebral soft tissue swelling or intraspinal hematoma.  Bone island C7.  Lower and mid-cervical facet arthropathy.  Significant right-sided neural foraminal narrowing C5-6.  Vascular calcification.  No pneumothorax.  No neck masses.  Mild pannus.  IMPRESSION: Spondylosis as described. No cervical spine fracture or traumatic subluxation.   Original Report Authenticated By: Rolla Flatten, M.D.    Ct Chest Wo Contrast  11/06/2012  *RADIOLOGY REPORT*  Clinical Data: Fall, shoulder pain, evaluate for fracture.  CT CHEST WITHOUT CONTRAST  Technique:  Multidetector CT imaging of the chest was performed following the standard protocol without IV contrast.  Comparison: Chest radiograph 11/06/2012, chest radiograph 03/29/2005  Findings: No evidence of fracture of the left or right scapula, no rib fracture or sternal fracture.  No evidence of spine fracture.  There is degenerative spurring of the thoracic spine.  Non-IV contrast images demonstrate no evidence of mediastinal hematoma or aortic injury.  No pericardial fluid.  There is no pneumothorax or pulmonary contusion.  There are is a rounded pleural thickening in the left upper lobe measuring 20 mm in depth which has is a very low density most consistent with a subpleural lipoma.  Limited view of the upper abdomen demonstrates no acute findings.  IMPRESSION:  1.  No evidence of thoracic trauma. 2. 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   Original Report Authenticated By: Suzy Bouchard, M.D.    Ct Cervical Spine Wo Contrast  11/06/2012  *RADIOLOGY REPORT*  Clinical Data:  Fall, head pain, neck pain.  CT HEAD WITHOUT CONTRAST CT CERVICAL SPINE WITHOUT CONTRAST  Technique:  Multidetector CT imaging of the head and cervical spine was performed following the standard protocol without intravenous contrast.  Multiplanar CT image reconstructions of the cervical spine were also generated.  Comparison:   None  CT HEAD  Findings: There is no evidence for acute infarction, intracranial hemorrhage, mass lesion, hydrocephalus, or extra-axial fluid.  Mild atrophy.  Moderate chronic microvascular ischemic change. 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 Calvarium intact.  Vascular calcification.  No sinus or mastoid disease.  IMPRESSION: Atrophy and chronic microvascular ischemic change.  No skull fracture or intracranial hemorrhage.  CT CERVICAL SPINE  Findings: No visible cervical spine fracture or traumatic subluxation.  Advanced disc space narrowing C5-C6, and moderate disc space narrowing C6-C7.  No prevertebral soft tissue swelling or intraspinal hematoma.  Bone island C7.  Lower and mid-cervical facet arthropathy.  Significant right-sided neural foraminal narrowing C5-6.  Vascular calcification.  No  pneumothorax.  No neck masses.  Mild pannus.  IMPRESSION: Spondylosis as  described. No cervical spine fracture or traumatic subluxation.   Original Report Authenticated By: Rolla Flatten, M.D.    Dg Shoulder Left  11/06/2012  *RADIOLOGY REPORT*  Clinical Data: 79 year old male status post fall with left shoulder pain.  LEFT SHOULDER - 2+ VIEW  Comparison: None.  Findings: No glenohumeral joint dislocation.  Proximal left humerus intact.  Left clavicle and scapula intact.  Visible left ribs and lung parenchyma within normal limits.  IMPRESSION: No acute fracture or dislocation identified about the left shoulder.   Original Report Authenticated By: Roselyn Reef, M.D.      1. Fall at home, initial encounter   2. Shoulder strain, left, initial encounter       MDM  Patient with a mechanical fall today when he slipped on his porch steps and fell down 2 steps on his back and left shoulder. He was able to ambulate after this and denies LOC. However he did hit his head and has severe pain in his left scapula and ribs. He denies any shortness of breath and is satting 100% on room air. He takes aspirin daily but no other anticoagulation. He is no abdominal pain or lower extremity pain.  Concern for rib fractures versus scapular injury or other shoulder injury. Chest x-ray, left-sided rib films and left shoulder x-rays pending. Head CT and C-spine pending.  Patient given pain control  5:51 PM Patient's pain is improved after pain medicine. Plain films and CT negative for acute fractures. All these results were discussed with the patient. He was given pain control and discharged home       Blanchie Dessert, MD 11/06/12 [place], MD 11/06/12 1754  Blanchie Dessert, MD 11/06/12 1756

## 2012-11-06 NOTE — ED Notes (Signed)
Patient requesting second dose of pain medication now.

## 2012-11-08 DIAGNOSIS — M999 Biomechanical lesion, unspecified: Secondary | ICD-10-CM | POA: Diagnosis not present

## 2012-11-08 DIAGNOSIS — M9981 Other biomechanical lesions of cervical region: Secondary | ICD-10-CM | POA: Diagnosis not present

## 2012-11-08 DIAGNOSIS — M542 Cervicalgia: Secondary | ICD-10-CM | POA: Diagnosis not present

## 2012-11-08 DIAGNOSIS — M546 Pain in thoracic spine: Secondary | ICD-10-CM | POA: Diagnosis not present

## 2012-11-13 DIAGNOSIS — E785 Hyperlipidemia, unspecified: Secondary | ICD-10-CM | POA: Diagnosis not present

## 2012-11-13 DIAGNOSIS — E1129 Type 2 diabetes mellitus with other diabetic kidney complication: Secondary | ICD-10-CM | POA: Diagnosis not present

## 2012-11-13 DIAGNOSIS — I10 Essential (primary) hypertension: Secondary | ICD-10-CM | POA: Diagnosis not present

## 2012-11-13 DIAGNOSIS — M1990 Unspecified osteoarthritis, unspecified site: Secondary | ICD-10-CM | POA: Diagnosis not present

## 2012-11-13 DIAGNOSIS — D631 Anemia in chronic kidney disease: Secondary | ICD-10-CM | POA: Diagnosis not present

## 2012-11-13 DIAGNOSIS — M109 Gout, unspecified: Secondary | ICD-10-CM | POA: Diagnosis not present

## 2012-11-13 DIAGNOSIS — M25519 Pain in unspecified shoulder: Secondary | ICD-10-CM | POA: Diagnosis not present

## 2012-11-13 DIAGNOSIS — N039 Chronic nephritic syndrome with unspecified morphologic changes: Secondary | ICD-10-CM | POA: Diagnosis not present

## 2012-11-13 DIAGNOSIS — E11319 Type 2 diabetes mellitus with unspecified diabetic retinopathy without macular edema: Secondary | ICD-10-CM | POA: Diagnosis not present

## 2012-11-16 DIAGNOSIS — M999 Biomechanical lesion, unspecified: Secondary | ICD-10-CM | POA: Diagnosis not present

## 2012-11-16 DIAGNOSIS — M546 Pain in thoracic spine: Secondary | ICD-10-CM | POA: Diagnosis not present

## 2012-11-16 DIAGNOSIS — M542 Cervicalgia: Secondary | ICD-10-CM | POA: Diagnosis not present

## 2012-11-16 DIAGNOSIS — S239XXA Sprain of unspecified parts of thorax, initial encounter: Secondary | ICD-10-CM | POA: Diagnosis not present

## 2012-11-16 DIAGNOSIS — S139XXA Sprain of joints and ligaments of unspecified parts of neck, initial encounter: Secondary | ICD-10-CM | POA: Diagnosis not present

## 2012-11-16 DIAGNOSIS — M5030 Other cervical disc degeneration, unspecified cervical region: Secondary | ICD-10-CM | POA: Diagnosis not present

## 2012-11-16 DIAGNOSIS — M9981 Other biomechanical lesions of cervical region: Secondary | ICD-10-CM | POA: Diagnosis not present

## 2012-11-17 ENCOUNTER — Encounter (HOSPITAL_BASED_OUTPATIENT_CLINIC_OR_DEPARTMENT_OTHER): Payer: Self-pay | Admitting: *Deleted

## 2012-11-17 NOTE — Progress Notes (Signed)
NPO AFTER MN. ARRIVES AT 0815. NEEDS ISTAT AND EKG. IF NEEDED, MAY TAKE TRAMADOL AM OF SURG W/ SIP OF WATER.

## 2012-11-20 ENCOUNTER — Encounter (HOSPITAL_BASED_OUTPATIENT_CLINIC_OR_DEPARTMENT_OTHER): Payer: Self-pay | Admitting: *Deleted

## 2012-11-22 DIAGNOSIS — M546 Pain in thoracic spine: Secondary | ICD-10-CM | POA: Diagnosis not present

## 2012-11-22 DIAGNOSIS — S139XXA Sprain of joints and ligaments of unspecified parts of neck, initial encounter: Secondary | ICD-10-CM | POA: Diagnosis not present

## 2012-11-22 DIAGNOSIS — S239XXA Sprain of unspecified parts of thorax, initial encounter: Secondary | ICD-10-CM | POA: Diagnosis not present

## 2012-11-22 DIAGNOSIS — M999 Biomechanical lesion, unspecified: Secondary | ICD-10-CM | POA: Diagnosis not present

## 2012-11-22 DIAGNOSIS — M5030 Other cervical disc degeneration, unspecified cervical region: Secondary | ICD-10-CM | POA: Diagnosis not present

## 2012-11-22 DIAGNOSIS — M9981 Other biomechanical lesions of cervical region: Secondary | ICD-10-CM | POA: Diagnosis not present

## 2012-11-22 DIAGNOSIS — M542 Cervicalgia: Secondary | ICD-10-CM | POA: Diagnosis not present

## 2012-11-24 ENCOUNTER — Encounter (HOSPITAL_BASED_OUTPATIENT_CLINIC_OR_DEPARTMENT_OTHER): Payer: Self-pay | Admitting: *Deleted

## 2012-11-24 ENCOUNTER — Ambulatory Visit (HOSPITAL_BASED_OUTPATIENT_CLINIC_OR_DEPARTMENT_OTHER)
Admission: RE | Admit: 2012-11-24 | Discharge: 2012-11-24 | Disposition: A | Discharge status: Home / Self Care | Payer: Medicare Other | Source: Ambulatory Visit | Attending: Urology | Admitting: Urology

## 2012-11-24 ENCOUNTER — Ambulatory Visit (HOSPITAL_BASED_OUTPATIENT_CLINIC_OR_DEPARTMENT_OTHER): Payer: Medicare Other | Admitting: Anesthesiology

## 2012-11-24 ENCOUNTER — Encounter (HOSPITAL_BASED_OUTPATIENT_CLINIC_OR_DEPARTMENT_OTHER): Payer: Self-pay | Admitting: Anesthesiology

## 2012-11-24 ENCOUNTER — Encounter (HOSPITAL_BASED_OUTPATIENT_CLINIC_OR_DEPARTMENT_OTHER)
Admission: RE | Disposition: A | Discharge status: Home / Self Care | Payer: Self-pay | Source: Ambulatory Visit | Attending: Urology

## 2012-11-24 DIAGNOSIS — N401 Enlarged prostate with lower urinary tract symptoms: Secondary | ICD-10-CM

## 2012-11-24 DIAGNOSIS — K219 Gastro-esophageal reflux disease without esophagitis: Secondary | ICD-10-CM | POA: Insufficient documentation

## 2012-11-24 DIAGNOSIS — N320 Bladder-neck obstruction: Secondary | ICD-10-CM | POA: Insufficient documentation

## 2012-11-24 DIAGNOSIS — Z79899 Other long term (current) drug therapy: Secondary | ICD-10-CM | POA: Insufficient documentation

## 2012-11-24 DIAGNOSIS — E7800 Pure hypercholesterolemia, unspecified: Secondary | ICD-10-CM | POA: Insufficient documentation

## 2012-11-24 DIAGNOSIS — I129 Hypertensive chronic kidney disease with stage 1 through stage 4 chronic kidney disease, or unspecified chronic kidney disease: Secondary | ICD-10-CM | POA: Insufficient documentation

## 2012-11-24 DIAGNOSIS — Z794 Long term (current) use of insulin: Secondary | ICD-10-CM | POA: Diagnosis not present

## 2012-11-24 DIAGNOSIS — Z7982 Long term (current) use of aspirin: Secondary | ICD-10-CM | POA: Diagnosis not present

## 2012-11-24 DIAGNOSIS — R351 Nocturia: Secondary | ICD-10-CM | POA: Diagnosis not present

## 2012-11-24 DIAGNOSIS — E1142 Type 2 diabetes mellitus with diabetic polyneuropathy: Secondary | ICD-10-CM | POA: Diagnosis not present

## 2012-11-24 DIAGNOSIS — N183 Chronic kidney disease, stage 3 unspecified: Secondary | ICD-10-CM | POA: Insufficient documentation

## 2012-11-24 DIAGNOSIS — R3915 Urgency of urination: Secondary | ICD-10-CM | POA: Diagnosis not present

## 2012-11-24 DIAGNOSIS — N139 Obstructive and reflux uropathy, unspecified: Secondary | ICD-10-CM | POA: Diagnosis not present

## 2012-11-24 DIAGNOSIS — R350 Frequency of micturition: Secondary | ICD-10-CM | POA: Diagnosis not present

## 2012-11-24 DIAGNOSIS — R339 Retention of urine, unspecified: Secondary | ICD-10-CM | POA: Diagnosis not present

## 2012-11-24 DIAGNOSIS — E1149 Type 2 diabetes mellitus with other diabetic neurological complication: Secondary | ICD-10-CM | POA: Diagnosis not present

## 2012-11-24 DIAGNOSIS — N138 Other obstructive and reflux uropathy: Secondary | ICD-10-CM | POA: Insufficient documentation

## 2012-11-24 DIAGNOSIS — N400 Enlarged prostate without lower urinary tract symptoms: Secondary | ICD-10-CM | POA: Diagnosis not present

## 2012-11-24 HISTORY — DX: Chronic kidney disease, stage 3 unspecified: N18.30

## 2012-11-24 HISTORY — DX: Strain of unspecified muscle, fascia and tendon at shoulder and upper arm level, left arm, initial encounter: S46.912A

## 2012-11-24 HISTORY — DX: Other injury of unspecified body region, initial encounter: T14.8XXA

## 2012-11-24 HISTORY — DX: Iron deficiency anemia, unspecified: D50.9

## 2012-11-24 HISTORY — DX: Personal history of urinary calculi: Z87.442

## 2012-11-24 HISTORY — PX: GREEN LIGHT LASER TURP (TRANSURETHRAL RESECTION OF PROSTATE: SHX6260

## 2012-11-24 HISTORY — DX: Chronic kidney disease, stage 3 (moderate): N18.3

## 2012-11-24 HISTORY — DX: Benign prostatic hyperplasia without lower urinary tract symptoms: N40.0

## 2012-11-24 HISTORY — DX: Secondary hyperparathyroidism of renal origin: N25.81

## 2012-11-24 LAB — POCT I-STAT 4, (NA,K, GLUC, HGB,HCT)
Glucose, Bld: 77 mg/dL (ref 70–99)
HCT: 34 % — ABNORMAL LOW (ref 39.0–52.0)
Potassium: 4.3 mEq/L (ref 3.5–5.1)

## 2012-11-24 LAB — GLUCOSE, CAPILLARY: Glucose-Capillary: 106 mg/dL — ABNORMAL HIGH (ref 70–99)

## 2012-11-24 SURGERY — GREEN LIGHT LASER TURP (TRANSURETHRAL RESECTION OF PROSTATE
Anesthesia: General | Site: Prostate | Wound class: Clean Contaminated

## 2012-11-24 MED ORDER — ACETAMINOPHEN 10 MG/ML IV SOLN
1000.0000 mg | Freq: Four times a day (QID) | INTRAVENOUS | Status: DC
  Administered 2012-11-24: 1000 mg via INTRAVENOUS
  Filled 2012-11-24: qty 100

## 2012-11-24 MED ORDER — DEXTROSE 50 % IV SOLN
12.5000 g | Freq: Once | INTRAVENOUS | Status: AC
  Administered 2012-11-24: 25 g via INTRAVENOUS
  Filled 2012-11-24: qty 50

## 2012-11-24 MED ORDER — TRAMADOL-ACETAMINOPHEN 37.5-325 MG PO TABS: 1.0000 | ORAL_TABLET | Freq: Four times a day (QID) | ORAL | Status: DC | PRN

## 2012-11-24 MED ORDER — DEXAMETHASONE SODIUM PHOSPHATE 4 MG/ML IJ SOLN
INTRAMUSCULAR | Status: DC | PRN
  Administered 2012-11-24: 8 mg via INTRAVENOUS

## 2012-11-24 MED ORDER — METOCLOPRAMIDE HCL 5 MG/ML IJ SOLN
INTRAMUSCULAR | Status: DC | PRN
  Administered 2012-11-24: 10 mg via INTRAVENOUS

## 2012-11-24 MED ORDER — ONDANSETRON HCL 4 MG/2ML IJ SOLN
INTRAMUSCULAR | Status: DC | PRN
  Administered 2012-11-24: 4 mg via INTRAVENOUS

## 2012-11-24 MED ORDER — LACTATED RINGERS IV SOLN
INTRAVENOUS | Status: DC
  Administered 2012-11-24: 100 mL/h via INTRAVENOUS
  Administered 2012-11-24: 10:00:00 via INTRAVENOUS
  Filled 2012-11-24: qty 1000

## 2012-11-24 MED ORDER — PHENAZOPYRIDINE HCL 200 MG PO TABS
200.0000 mg | ORAL_TABLET | Freq: Once | ORAL | Status: AC
  Administered 2012-11-24: 200 mg via ORAL
  Filled 2012-11-24: qty 1

## 2012-11-24 MED ORDER — CIPROFLOXACIN IN D5W 400 MG/200ML IV SOLN
400.0000 mg | INTRAVENOUS | Status: AC
  Administered 2012-11-24: 400 mg via INTRAVENOUS
  Filled 2012-11-24: qty 200

## 2012-11-24 MED ORDER — PHENAZOPYRIDINE HCL 200 MG PO TABS: 200.0000 mg | ORAL_TABLET | Freq: Three times a day (TID) | ORAL | Status: DC | PRN

## 2012-11-24 MED ORDER — FENTANYL CITRATE 0.05 MG/ML IJ SOLN
25.0000 ug | INTRAMUSCULAR | Status: DC | PRN
  Filled 2012-11-24: qty 1

## 2012-11-24 MED ORDER — LACTATED RINGERS IV SOLN
INTRAVENOUS | Status: DC
  Filled 2012-11-24: qty 1000

## 2012-11-24 MED ORDER — GLYCOPYRROLATE 0.2 MG/ML IJ SOLN
INTRAMUSCULAR | Status: DC | PRN
  Administered 2012-11-24: 0.2 mg via INTRAVENOUS

## 2012-11-24 MED ORDER — FENTANYL CITRATE 0.05 MG/ML IJ SOLN
INTRAMUSCULAR | Status: DC | PRN
  Administered 2012-11-24 (×3): 50 ug via INTRAVENOUS

## 2012-11-24 MED ORDER — SODIUM CHLORIDE 0.9 % IR SOLN
Status: DC | PRN
  Administered 2012-11-24: 6000 mL via INTRAVESICAL

## 2012-11-24 MED ORDER — TRAMADOL-ACETAMINOPHEN 37.5-325 MG PO TABS
1.0000 | ORAL_TABLET | Freq: Four times a day (QID) | ORAL | Status: DC | PRN
  Administered 2012-11-24: 1 via ORAL
  Filled 2012-11-24: qty 1

## 2012-11-24 MED ORDER — PROPOFOL 10 MG/ML IV BOLUS
INTRAVENOUS | Status: DC | PRN
  Administered 2012-11-24: 100 mg via INTRAVENOUS

## 2012-11-24 MED ORDER — CIPROFLOXACIN HCL 500 MG PO TABS
500.0000 mg | ORAL_TABLET | Freq: Two times a day (BID) | ORAL | Status: DC
Start: 2012-11-24 — End: 2012-12-08

## 2012-11-24 MED ORDER — SODIUM CHLORIDE 0.9 % IV SOLN
INTRAVENOUS | Status: DC | PRN
  Administered 2012-11-24: 1000 mL

## 2012-11-24 MED ORDER — BELLADONNA ALKALOIDS-OPIUM 16.2-60 MG RE SUPP
RECTAL | Status: DC | PRN
  Administered 2012-11-24: 1 via RECTAL

## 2012-11-24 MED ORDER — LIDOCAINE HCL (CARDIAC) 20 MG/ML IV SOLN
INTRAVENOUS | Status: DC | PRN
  Administered 2012-11-24: 50 mg via INTRAVENOUS

## 2012-11-24 SURGICAL SUPPLY — 21 items
BAG URINE DRAINAGE (UROLOGICAL SUPPLIES) ×2 IMPLANT
BAG URO CATCHER STRL LF (DRAPE) ×2 IMPLANT
CATH FOLEY 2WAY SLVR  5CC 22FR (CATHETERS) ×1
CATH FOLEY 2WAY SLVR 30CC 22FR (CATHETERS) ×2 IMPLANT
CATH FOLEY 2WAY SLVR 5CC 22FR (CATHETERS) ×1 IMPLANT
CATH FOLEY 3WAY 30CC 22F (CATHETERS) ×2 IMPLANT
CLOTH BEACON ORANGE TIMEOUT ST (SAFETY) ×2 IMPLANT
DRAPE CAMERA CLOSED 9X96 (DRAPES) ×2 IMPLANT
ELECT BUTTON HF 24-28F 2 30DE (ELECTRODE) IMPLANT
ELECT LOOP MED HF 24F 12D (CUTTING LOOP) IMPLANT
ELECT LOOP MED HF 24F 12D CBL (CLIP) IMPLANT
ELECT RESECT VAPORIZE 12D CBL (ELECTRODE) IMPLANT
GLOVE BIOGEL M STRL SZ7.5 (GLOVE) ×4 IMPLANT
GOWN STRL REIN XL XLG (GOWN DISPOSABLE) ×2 IMPLANT
HOLDER FOLEY CATH W/STRAP (MISCELLANEOUS) ×2 IMPLANT
IV NS IRRIG 3000ML ARTHROMATIC (IV SOLUTION) ×2 IMPLANT
LASER FIBER /GREENLIGHT LASER (Laser) ×2 IMPLANT
LASER GREENLIGHT RENTAL P/PROC (Laser) ×2 IMPLANT
PACK CYSTOSCOPY (CUSTOM PROCEDURE TRAY) ×2 IMPLANT
SYR 30ML LL (SYRINGE) ×2 IMPLANT
SYRINGE IRR TOOMEY STRL 70CC (SYRINGE) IMPLANT

## 2012-11-24 NOTE — H&P (Signed)
Chief Complaint   cc: Dr. Osborne Casco   Reason For Visit  6 mo f/u, flowrate and PVR   Active Problems Problems  1. Benign Localized Prostatic Hyperplasia With Urinary Obstruction 600.21 2. Bladder Neck Contracture 596.0 3. Chronic Kidney Disease, Stage 3 585.3 4. Male Erectile Disorder Due To Physical Condition 607.84 5. History of  Nephrolithiasis V13.01 6. No Orgasmic Sensation Felt During Ejaculation 7. Organic Impotence 607.84  History of Present Illness        79 year old, insulin-dependent diabetic male, returns today for a 6 mo f/u, flowrate and PVR after taking Tamsulosin and Avodart.   Originally referred by Dr. Johann Capers for evaluation of bladder outlet obstruction, despite Avodart, and doxazosin treatment. He has international prostate symptom score of 23 (normal equals 7). He complains of incomplete emptying, urgency, frequency, straining, weak flow, and nocturia x2.  Video urodynamics is accomplished on 04/04/12, in the sitting position. First sensation occurs at 263 cc, normal desire at 285 cc and strong desire at 415 cc. Although the bladder does become unstable, at 437 cc, the maximum unstable bladder contraction pressure is only 6 cm water, and no urinary leakage occurs.  The patient did not leak for Valsalva leak point pressure and abdominal pressure of 102 cm of water.  Pressure flow study is accomplished, and shows that there is a voluntary contraction generated, with a maximum flow rate of 12 cc per second, and a detrusor pressure at maximum flow of 39 cm of water. The maximum detrusor pressure was 45 cmH20 . PVR is 384 cc and double void PVR is 85 cc. The patient believes that this was a good representation of his flow pattern.  Insulin-dependent diabetic with a somewhat hyposensitive bladder, with first sensation not occurring until 263 cc. While he does have low amplitude instability, there is no leakage associated with this. The patient is able to generate a voluntary  contraction, and does have a modest flow rate of 12 cc per second at best, with a high detrusor pressure of 30 cm H20 rising to 40 cm H20. Initial PVR is 384 cc, with double voided PVR of 85 cc.  Although the patient does have some initial hyposensitivy,he does retain reasonable detrusor contractility. He is relatively obstructed, and may benefit from cystoscopy to see if he has obstructing prostate.   Past Medical History Problems  1. History of  Acute Bacterial Prostatitis 601.0 2. History of  Diabetes Mellitus 250.00 3. History of  Diabetes Mellitus 250.00 4. History of  Esophageal Reflux 530.81 5. History of  Hypercholesterolemia 272.0 6. History of  Hypertension 401.9 7. History of  Nephrolithiasis V13.01 8. History of  Renal Failure 586  Surgical History Problems  1. History of  Cataract Surgery  Current Meds 1. AmLODIPine Besylate 10 MG Oral Tablet; Therapy: 04Oct2012 to 2. Aspirin TABS; Therapy: (Recorded:10Jul2013) to 3. Avodart CAPS; Therapy: (Recorded:23Sep2008) to 4. Calcitriol 0.25 MCG Oral Capsule; Therapy: 30Apr2012 to 5. Fish Oil CAPS; Therapy: (Recorded:10Jul2013) to 6. Furosemide 20 MG Oral Tablet; Therapy: [id number] to 7. Glimepiride TABS; Therapy: (Recorded:23Sep2008) to 8. Iron TABS; Therapy: (Recorded:10Jul2013) to 9. Januvia 50 MG Oral Tablet; Therapy: (Recorded:10Jul2013) to 10. Lantus SoloStar 100 UNIT/ML Subcutaneous Solution; Therapy: [id number] to 11. Lipitor TABS; Therapy: (Recorded:23Sep2008) to 12. Micardis 40 MG Oral Tablet; Therapy: (Recorded:10Jul2013) to 13. [place]; Therapy: (Recorded:23Sep2008) to 14. Tamsulosin HCl 0.4 MG Oral Capsule; TAKE 1 CAPSULE Bedtime; Therapy: 21Aug2013 to   (Evaluate:16Aug2014)  Requested for: 21Aug2013; Last Rx:21Aug2013 15. Viagra 100 MG Oral Tablet; TAKE  1 TABLET As Directed; Therapy: 21Aug2013 to (Last   Rx:21Aug2013)  Requested for: 21Aug2013 16. Vitamin B12 TABS; Therapy: (Recorded:10Jul2013)  to  Allergies Medication  1. Codeine Derivatives 2. Penicillins 3. Sulfa Drugs  Family History Problems  1. Family history of  Death In The Family Father Deceased at age 69 ; Stroke 2. Family history of  Death In The Family Mother Deceased at age 50 Stroke 3. Family history of  Diabetes Mellitus V18.0 mother, grandmother, son 20. Family history of  Family Health Status Number Of Children 3 sons and one daughter  Social History Problems  1. Marital History - Currently Married 2. Occupation: Retired Psychologist, sport and exercise 3. Tobacco Use V15.82 Quit 25 yrs ago-smoked for 30 years- smoked 4 packs per day Denied  4. History of  Alcohol Use 5. History of  Caffeine Use  Review of Systems Genitourinary, constitutional, skin, eye, otolaryngeal, hematologic/lymphatic, cardiovascular, pulmonary, endocrine, musculoskeletal, gastrointestinal, neurological and psychiatric system(s) were reviewed and pertinent findings if present are noted.  Genitourinary: urinary frequency, feelings of urinary urgency, nocturia, weak urinary stream, urinary stream starts and stops, incomplete emptying of bladder and initiating urination requires straining.    Vitals Vital Signs [Data Includes: Last 1 Day]  [id number] 10:23AM  Blood Pressure: 134 / 68 Temperature: 97.7 F Heart Rate: 67  Physical Exam Constitutional: Well nourished and well developed . No acute distress. Alert an d oriented.  ENT:. The ears and nose are normal in appearance.  Neck: The appearance of the neck is normal.  Pulmonary: No respiratory distress.  Cardiovascular:. No peripheral edema.  Rectal: Rectal exam demonstrates decreased sphincter tone, the anus is normal on inspection., no tenderness, no masses, no fistula, no anal fissure, no residual hemorrhoidal skin tags seen, no internal hemorrhoids, no external hemorrhoids and no fecal impaction. The prostate is smooth and flat . Estimated prostate size is 1+. The prostate has no nodularity, is  not indurated, is not tender and is not fluctuant. The left seminal vesicle is nonpalpable. The right seminal vesicle is nonpalpable. The perineum is normal on inspection, no perineal tenderness.  Genitourinary: Examination of the penis demonstrates no swelling, no discharge, no masses, no adherence of the prepuce, no lesions and a normal meatus. The penis is uncircumcised. The scrotum is normal in appearance and without lesions. The right epididymis is palpably normal and non-tender. The left epididymis is palpably normal and non-tender. The right testis is palpably normal, non-tender and without masses. The left testis is normal, non-tender and without masses.  Lymphatics: The femoral and inguinal nodes are not enlarged or tender.  Skin: Normal skin turgor, no visible rash and no visible skin lesions.  Neuro/Psych:. Mood and affect are appropriate.    Results/Data  Flow Rate: Voided 20 ml. A peak flow rate of 72ml/s and mean flow rate of 52ml/s.  PVR: Ultrasound PVR 42 ml.    Assessment Assessed  1. Benign Localized Prostatic Hyperplasia With Urinary Obstruction 600.21 2. Bladder Neck Contracture 596.0 3. Chronic Kidney Disease, Stage 3 585.3   79 yo male diabetic with peripheral neuropathy, and BPH on tamulosin/avodart per Dr. Terance Hart for yrs, now with increasing symptoms, with IPSS= 20 despite meds, and peak flow of 3cc/sec, and pvr=42cc. He has CKD, stage 3. He is on ASA.    He is a Psychologist, sport and exercise, and notes that when he gets up to void at nighttime, he cannot go back to sleep, and loses much sleep that way. He would lioke to have prostate surgery before "planting time" this  spring.   Plan Bladder Neck Contracture (596.0)  1. Follow-up Schedule Surgery Office  Follow-up  Requested for: [id number]   Pt is a reasonable candidate for [facility]. I do not need pathology on his tissue. We have discussed the GLL, and need for cath for 2 days post op. He wil be done as an outpatient. IV Tylenol  only.   Signatures Electronically signed by : Carolan Clines, M.D.; Oct 25 2012 11:30AM

## 2012-11-24 NOTE — Op Note (Signed)
Pre-operative diagnosis :    BPH with urinary retention  Postoperative diagnosis:   Same  Operation:   Cystourethroscopy, greenlight laser vaporization of the prostate  Surgeon:  S. Gaynelle Arabian, MD  First assistant: None     Anesthesia:  general  Preparation:   After appropriate preanesthesia, the patient was brought to the operating room, placed on the operating table in the dorsal supine position where general LMA anesthesia was introduced. He was then placed in the dorsal lithotomy position with pubis was prepped with Betadine solution and draped in usual fashion. Armband was double checked.   Review history:   V  Statement of  Likelihood of Success: Excellent. TIME-OUT observed.:  Procedure:   Cystourethroscopy was accomplished, which showed normal penile glans. There was no evidence of urethral stricture disease. The Vero montanum was in normal position. There was trilobar BPH. The bladder was normal, and there was trabeculation. No cellules and no bladder diverticula were seen. No bladder stones were identified. No bladder tumors were identified.  Using the greenlight laser, vaporization was accomplished at the 5:00 and 7:00 positions, using 23 W at the bladder neck, and 120 W in the mid prostatic urethra. A trans-was developed bilaterally to the level of the Vero, and the median lobe was vaporized. The lateral lobes were then vaporized. There was minor bleeding noted from the right lateral lobe, and this was laser coagulated. Repeat evaluation showed no bleeding. The prostatic fossa appeared open. The bladder was irrigated easily, and a size 22 Foley catheter with 30 cc balloon was placed. A traction device was placed on the leg in case it was needed at a later time. The patient received IV Tylenol. He was awakened and taken to recovery room in good condition.

## 2012-11-24 NOTE — Anesthesia Preprocedure Evaluation (Addendum)
Anesthesia Evaluation  Patient identified by MRN, date of birth, ID band Patient awake    Reviewed: Allergy & Precautions, H&P , NPO status , Patient's Chart, lab work & pertinent test results  Airway Mallampati: II TM Distance: >3 FB Neck ROM: full    Dental  (+) Missing and Dental Advisory Given Missing several teeth in front including left front upper:   Pulmonary neg pulmonary ROS,  breath sounds clear to auscultation  Pulmonary exam normal       Cardiovascular Exercise Tolerance: Good hypertension, Pt. on medications Rhythm:regular Rate:Normal  RBBB   Neuro/Psych negative neurological ROS  negative psych ROS   GI/Hepatic negative GI ROS, Neg liver ROS,   Endo/Other  diabetes, Well Controlled, Type 2, Oral Hypoglycemic Agents and Insulin Dependent  Renal/GU negative Renal ROSStage 3 chronic kidney disease ( moderate )  negative genitourinary   Musculoskeletal   Abdominal   Peds  Hematology negative hematology ROS (+)   Anesthesia Other Findings   Reproductive/Obstetrics negative OB ROS                         Anesthesia Physical Anesthesia Plan  ASA: III  Anesthesia Plan: General   Post-op Pain Management:    Induction: Intravenous  Airway Management Planned: LMA  Additional Equipment:   Intra-op Plan:   Post-operative Plan:   Informed Consent: I have reviewed the patients History and Physical, chart, labs and discussed the procedure including the risks, benefits and alternatives for the proposed anesthesia with the patient or authorized representative who has indicated his/her understanding and acceptance.   Dental Advisory Given  Plan Discussed with: CRNA and Surgeon  Anesthesia Plan Comments:         Anesthesia Quick Evaluation

## 2012-11-24 NOTE — Anesthesia Postprocedure Evaluation (Signed)
Anesthesia Post Note  Patient: Samuel [name]  Procedure(s) Performed: Procedure(s) (LRB): GREEN LIGHT LASER TURP (TRANSURETHRAL RESECTION OF PROSTATE (N/A)  Anesthesia type: general  Patient location: PACU  Post pain: Pain level controlled  Post assessment: Patient's Cardiovascular Status Stable  Last Vitals:  Filed Vitals:   11/24/12 1115  BP:   Pulse: 75  Temp:   Resp: 11    Post vital signs: Reviewed and stable  Level of consciousness: sedated  Complications: No apparent anesthesia complications

## 2012-11-24 NOTE — Interval H&P Note (Signed)
History and Physical Interval Note:  11/24/2012 9:33 AM  Samuel [name]  has presented today for surgery, with the diagnosis of benign prostatic hyperplasia  The various methods of treatment have been discussed with the patient and family. After consideration of risks, benefits and other options for treatment, the patient has consented to  Procedure(s): GREEN LIGHT LASER TURP (TRANSURETHRAL RESECTION OF PROSTATE (N/A) as a surgical intervention .  The patient's history has been reviewed, patient examined, no change in status, stable for surgery.  I have reviewed the patient's chart and labs.  Questions were answered to the patient's satisfaction.     Carolan Clines I

## 2012-11-24 NOTE — Transfer of Care (Signed)
Immediate Anesthesia Transfer of Care Note  Patient: Samuel [name]  Procedure(s) Performed: Procedure(s) (LRB): GREEN LIGHT LASER TURP (TRANSURETHRAL RESECTION OF PROSTATE (N/A)  Patient Location: PACU  Anesthesia Type: General  Level of Consciousness: drowsy, follows commands  Airway & Oxygen Therapy: Patient Spontanous Breathing and Patient connected to face mask oxygen  Post-op Assessment: Report given to PACU RN and Post -op Vital signs reviewed and stable  Post vital signs: Reviewed and stable  Complications: No apparent anesthesia complications

## 2012-11-24 NOTE — Anesthesia Procedure Notes (Signed)
Procedure Name: LMA Insertion Date/Time: 11/24/2012 9:43 AM Performed by: Mechele Claude Pre-anesthesia Checklist: Patient identified, Emergency Drugs available, Suction available and Patient being monitored Patient Re-evaluated:Patient Re-evaluated prior to inductionOxygen Delivery Method: Circle System Utilized Preoxygenation: Pre-oxygenation with 100% oxygen Intubation Type: IV induction Ventilation: Mask ventilation without difficulty LMA: LMA inserted LMA Size: 4.0 Number of attempts: 1 Airway Equipment and Method: bite block Placement Confirmation: positive ETCO2 Tube secured with: Tape Dental Injury: Teeth and Oropharynx as per pre-operative assessment

## 2012-11-24 NOTE — Discharge Instructions (Signed)
Benign Prostatic Hyperplasia You have an enlarged prostate. This is common in elderly males. It is called BPH. This stands for benign prostate hyperplasia. The prostate gland is located in base of the bladder. When it grows, the prostate blocks the urethra. This is the tube which drains urine from the bladder.  SYMPTOMS  Weak urine stream.  Dribbling.  Feeling like the bladder has not emptied completely.  Difficulty starting urination.  Getting up frequently at night to urinate.  Urinating more frequently during the day. Complete urinary blockage or severe pain with urination requires immediate attention. DIAGNOSIS   Your caregiver often has a good idea what is wrong by taking a history and doing a physical exam.  Special x-rays may be done. TREATMENT   For mild problems, no treatment may be necessary.  If the problems are moderate, medications may provide relief. Some of these work by making the prostate gland smaller. The herb saw palmetto is commonly used.  If complete blockage occurs, a Foley catheter is usually left in place for a few days.  Surgery is often needed for more severe problems. TURP is the prostate surgery for BPH which is done through the urethra. TURP stands for transurethral resection of the prostate. It involves cutting away chips from the prostate. It is done by removing chips so that they can come out through the penis.  Techniques using heat, microwave and laser to remove the prostate blockage are also being used. HOME CARE INSTRUCTIONS   Give yourself time when you urinate.  Stay away from alcohol.  Beverages containing caffeine such as coffee, tea and colas can make the problems worse.  Decongestants, antihistamines, and some prescription medicines can also make the problem worse.  Follow up with your caregiver for further treatment as recommended. SEEK IMMEDIATE MEDICAL CARE IF:   You develop increased pain with urination or are unable to pass  your water.  You develop severe abdominal pain, vomiting, a high fever, or fainting.  You develop back pain or blood in your urine. MAKE SURE YOU:   Understand these instructions.  Will watch your condition.  Will get help right away if you are not doing well or get worse. Document Released: 08/23/2005 Document Revised: 11/15/2011 Document Reviewed: 04/28/2007 [facility] Patient Information 2013 [place].  Julianne Rice Laser Prostate Treatment The prostate is a gland of the male reproductive system. Most men older than 50 years have some enlargement of the prostate (benign prostatic hyperplasia). As the prostate enlarges, men find it increasingly difficult to pass urine. Medicines to shrink the size of the prostate can be used. If these medicines are not effective, surgery may be needed. Traditional surgery performed for benign prostatic hyperplasia is known as transurethral resection of the prostate. This surgery requires a hospital stay and may be associated with certain complications. Green light laser therapy is a new procedure that uses a special high-energy laser for vaporizing extra prostate tissue. It is a minor procedure compared with traditional surgery. It limits the chance of destroying the surrounding tissue. Also, it can be done under spinal or general anesthesia. Green light laser therapy may provide longstanding and almost immediate relief of a patient's symptoms.  LET YOUR CAREGIVER KNOW ABOUT:  Any allergies you have.  Any medicines you take, including herbs, eye drops, over-the-counter medication, and creams.  Previous problems you have had with anesthetics.  Symptoms such as fever or pain or burning while urinating. RISKS AND COMPLICATIONS  Incontinence of urine (problem holding urine).  Presence of blood in urine (hematuria).  Cramps in the bladder (bladder spasm).  Frequent urge to pass urine (urgency).  More frequent urination and burning on  urination.  Sexual problems.  Scar tissue in the urinary passage.  Infection. BEFORE THE PROCEDURE   Your caregiver may discuss your anesthesia requirements.  Your caregiver may discuss medicines you are taking and may advise you to stop specific ones.  You may be given some pain medicine.  You may be given some medicine to help you sleep. PROCEDURE A laser is inserted into your prostate through your urinary passage (urethra) using a tube with an optical device at the end (endoscope). This helps your surgeon to clearly see the affected area. The green light laser applies high-power laser energy to the affected tissue until it is vaporized. This procedure is continued until all the affected tissue is removed. The loss of blood is minimal. There is little damage to the surrounding tissues. The entire procedure may take about 60 minutes. It is rare to require a catheter for longer than 2 hours after the procedure. You may be allowed to go home on the same day. Overnight stay is rarely needed. AFTER THE PROCEDURE   You may experience mild discomfort.  You may have burning on urination for about 1 week.  You may be given mild pain medicine and medicine to reduce inflammation.  You may be given a medicine to kill germs (antibiotic).  Your catheter may be removed within 48 hours.  You may be allowed to go home the same day or you may have to stay overnight.  You may be able to return to your normal routine activities within 24 hours.  You should avoid doing any strenuous activity for about 2 weeks after the procedure.  You should take medicines as advised by your caregiver.  You should follow the instructions given by your caregiver. SEEK MEDICAL CARE IF:   You have no relief of symptoms after the procedure.  You have an oral temperature of 102 F (38.9 C).  You have chills or night sweats  You have a temporary catheter and you are leaking around your catheter or have  problems with your catheter.  You develop side effects which you think are coming from your medicine. SEEK IMMEDIATE MEDICAL CARE IF:   You have severe bleeding.  You are suddenly unable to urinate. This is an emergency.  You develop shortness of breath or chest pains.  Clots develop.  You have an oral temperature above 102 F (38.9 C), not controlled by medicine.  You develop pain in your back or over your lower abdomen.  You develop pain or swelling in your legs. Document Released: 11/30/2007 Document Revised: 11/15/2011 Document Reviewed: 11/30/2007 [facility] Patient Information 2013 [place].    Post Anesthesia Home Care Instructions  Activity: Get plenty of rest for the remainder of the day. A responsible adult should stay with you for 24 hours following the procedure.  For the next 24 hours, DO NOT: -Drive a car -Paediatric nurse -Drink alcoholic beverages -Take any medication unless instructed by your physician -Make any legal decisions or sign important papers.  Meals: Start with liquid foods such as gelatin or soup. Progress to regular foods as tolerated. Avoid greasy, spicy, heavy foods. If nausea and/or vomiting occur, drink only clear liquids until the nausea and/or vomiting subsides. Call your physician if vomiting continues.  Special Instructions/Symptoms: Your throat may feel dry or sore from the anesthesia or the breathing  tube placed in your throat during surgery. If this causes discomfort, gargle with warm salt water. The discomfort should disappear within 24 hours.

## 2012-11-28 ENCOUNTER — Encounter (HOSPITAL_BASED_OUTPATIENT_CLINIC_OR_DEPARTMENT_OTHER): Payer: Self-pay | Admitting: Urology

## 2012-12-04 DIAGNOSIS — E1129 Type 2 diabetes mellitus with other diabetic kidney complication: Secondary | ICD-10-CM | POA: Diagnosis not present

## 2012-12-04 DIAGNOSIS — I10 Essential (primary) hypertension: Secondary | ICD-10-CM | POA: Diagnosis not present

## 2012-12-05 DIAGNOSIS — N401 Enlarged prostate with lower urinary tract symptoms: Secondary | ICD-10-CM | POA: Diagnosis not present

## 2012-12-07 DIAGNOSIS — M9981 Other biomechanical lesions of cervical region: Secondary | ICD-10-CM | POA: Diagnosis not present

## 2012-12-07 DIAGNOSIS — M999 Biomechanical lesion, unspecified: Secondary | ICD-10-CM | POA: Diagnosis not present

## 2012-12-07 DIAGNOSIS — M5137 Other intervertebral disc degeneration, lumbosacral region: Secondary | ICD-10-CM | POA: Diagnosis not present

## 2012-12-07 DIAGNOSIS — M5030 Other cervical disc degeneration, unspecified cervical region: Secondary | ICD-10-CM | POA: Diagnosis not present

## 2012-12-08 ENCOUNTER — Emergency Department (HOSPITAL_COMMUNITY)
Admission: EM | Admit: 2012-12-08 | Discharge: 2012-12-08 | Disposition: A | Discharge status: Home / Self Care | Payer: Medicare Other | Attending: Emergency Medicine | Admitting: Emergency Medicine

## 2012-12-08 ENCOUNTER — Encounter (HOSPITAL_COMMUNITY): Payer: Self-pay | Admitting: Emergency Medicine

## 2012-12-08 DIAGNOSIS — Z794 Long term (current) use of insulin: Secondary | ICD-10-CM | POA: Insufficient documentation

## 2012-12-08 DIAGNOSIS — Y939 Activity, unspecified: Secondary | ICD-10-CM | POA: Insufficient documentation

## 2012-12-08 DIAGNOSIS — E119 Type 2 diabetes mellitus without complications: Secondary | ICD-10-CM | POA: Insufficient documentation

## 2012-12-08 DIAGNOSIS — S81009A Unspecified open wound, unspecified knee, initial encounter: Secondary | ICD-10-CM | POA: Insufficient documentation

## 2012-12-08 DIAGNOSIS — I129 Hypertensive chronic kidney disease with stage 1 through stage 4 chronic kidney disease, or unspecified chronic kidney disease: Secondary | ICD-10-CM | POA: Insufficient documentation

## 2012-12-08 DIAGNOSIS — Z87442 Personal history of urinary calculi: Secondary | ICD-10-CM | POA: Diagnosis not present

## 2012-12-08 DIAGNOSIS — S91009A Unspecified open wound, unspecified ankle, initial encounter: Secondary | ICD-10-CM | POA: Insufficient documentation

## 2012-12-08 DIAGNOSIS — K083 Retained dental root: Secondary | ICD-10-CM | POA: Insufficient documentation

## 2012-12-08 DIAGNOSIS — Y92009 Unspecified place in unspecified non-institutional (private) residence as the place of occurrence of the external cause: Secondary | ICD-10-CM | POA: Insufficient documentation

## 2012-12-08 DIAGNOSIS — D509 Iron deficiency anemia, unspecified: Secondary | ICD-10-CM | POA: Insufficient documentation

## 2012-12-08 DIAGNOSIS — S81809A Unspecified open wound, unspecified lower leg, initial encounter: Secondary | ICD-10-CM | POA: Diagnosis not present

## 2012-12-08 DIAGNOSIS — Z79899 Other long term (current) drug therapy: Secondary | ICD-10-CM | POA: Insufficient documentation

## 2012-12-08 DIAGNOSIS — Z7982 Long term (current) use of aspirin: Secondary | ICD-10-CM | POA: Diagnosis not present

## 2012-12-08 DIAGNOSIS — E7800 Pure hypercholesterolemia, unspecified: Secondary | ICD-10-CM | POA: Diagnosis not present

## 2012-12-08 DIAGNOSIS — W540XXA Bitten by dog, initial encounter: Secondary | ICD-10-CM | POA: Insufficient documentation

## 2012-12-08 DIAGNOSIS — S81851A Open bite, right lower leg, initial encounter: Secondary | ICD-10-CM

## 2012-12-08 DIAGNOSIS — N400 Enlarged prostate without lower urinary tract symptoms: Secondary | ICD-10-CM | POA: Diagnosis not present

## 2012-12-08 MED ORDER — SULFAMETHOXAZOLE-TMP DS 800-160 MG PO TABS
1.0000 | ORAL_TABLET | Freq: Once | ORAL | Status: DC
  Filled 2012-12-08: qty 1

## 2012-12-08 MED ORDER — METRONIDAZOLE 500 MG PO TABS: 500.0000 mg | ORAL_TABLET | Freq: Two times a day (BID) | ORAL | Status: DC

## 2012-12-08 MED ORDER — DOXYCYCLINE HYCLATE 100 MG PO CAPS: 100.0000 mg | ORAL_CAPSULE | Freq: Two times a day (BID) | ORAL | Status: DC

## 2012-12-08 MED ORDER — DOXYCYCLINE HYCLATE 100 MG PO TABS
100.0000 mg | ORAL_TABLET | Freq: Once | ORAL | Status: AC
  Administered 2012-12-08: 100 mg via ORAL
  Filled 2012-12-08: qty 1

## 2012-12-08 MED ORDER — METRONIDAZOLE 500 MG PO TABS
500.0000 mg | ORAL_TABLET | Freq: Once | ORAL | Status: AC
  Administered 2012-12-08: 500 mg via ORAL
  Filled 2012-12-08: qty 1

## 2012-12-08 MED ORDER — METRONIDAZOLE 500 MG PO TABS: 500.0000 mg | ORAL_TABLET | Freq: Three times a day (TID) | ORAL | Status: DC

## 2012-12-08 MED ORDER — CIPROFLOXACIN HCL 500 MG PO TABS: 500.0000 mg | ORAL_TABLET | Freq: Two times a day (BID) | ORAL | Status: DC

## 2012-12-08 NOTE — ED Notes (Signed)
Patient with dog bite to right lower calf of leg.  Patient knows owner of dog and is going to contact animal control tomorrow.  Owners of dog [place].  Patient reports cleaning wound with peroxide initially and bandaging the area.

## 2012-12-08 NOTE — ED Provider Notes (Signed)
History    This chart was scribed for non-physician practitioner working with Samuel Acosta, MD by [name], ED Scribe. This patient was seen in room WTR7/WTR7 and the patient's care was started at 1818.   CSN: [id number]  Arrival date & time 12/08/12  1818   First MD Initiated Contact with Patient 12/08/12 1823      Chief Complaint  Patient presents with  . Animal Bite     The history is provided by the patient. No language interpreter was used.    Samuel [name] is a 79 y.o. male who presents to the Emergency Department complaining of a dog bite to the right lower calf of leg that occurred today. Pt states he went to check on a neighbors house and was bitten by their dog. Pt states he knows the owners of the dog and is going to contact animal control tomorrow. Pt applied peroxide and put a clean bandage on the wound. Pt states he is unsure of his last tetanus but states he should be covered. He states he will check with PCP. The onset of this condition was acute. The course is constant. Aggravating factors: none. Alleviating factors: none.    Pt is a former smoker but denies alcohol use.  Past Medical History  Diagnosis Date  . Hypertension   . Hypercholesteremia   . BPH (benign prostatic hyperplasia)   . History of kidney stones   . Left shoulder strain     PT FELL ON ICE 11-06-2012  . Bruise     LEFT BACK/SHOULDER AREA  . Iron deficiency anemia   . Chronic kidney disease (CKD), stage III (moderate) BASE CRE  1.89  -- 2.2    NEPHROLOGIST-- [name]--  LOV DEC 2013  W/ CHART  . Hyperparathyroidism, secondary   . Diabetes mellitus     PCP--  [name]    Past Surgical History  Procedure Laterality Date  . Cysto/ left ureteral stent placement  02-07-2003  . Left ureteroscopic stone extraction  02-14-2003  . Cysto/ right ureter balloon dilation/ stone extraction  02-20-2003  . Blepharoplasty Bilateral 04-02-2005  . Green light laser turp (transurethral  resection of prostate N/A 11/24/2012    Procedure: GREEN LIGHT LASER TURP (TRANSURETHRAL RESECTION OF PROSTATE;  Surgeon: [name], MD;  Location: [facility];  Service: Urology;  Laterality: N/A;    No family history on file.  History  Substance Use Topics  . Smoking status: Former Smoker -- 4.00 packs/day for 40 years    Types: Cigarettes    Quit date: 11/17/1992  . Smokeless tobacco: Never Used  . Alcohol Use: No      Review of Systems  Constitutional: Negative for fever.  Gastrointestinal: Negative for nausea and vomiting.  Skin: Positive for wound (dog bite).  Neurological: Negative for weakness and numbness.    Allergies  Codeine and Penicillins  Home Medications   Current Outpatient Rx  Name  Route  Sig  Dispense  Refill  . allopurinol (ZYLOPRIM) 100 MG tablet   Oral   Take 100 mg by mouth every evening.         [name] amLODipine (NORVASC) 10 MG tablet   Oral   Take 10 mg by mouth every evening.          [name] aspirin EC 81 MG tablet   Oral   Take 81 mg by mouth every evening.          [name] atorvastatin (LIPITOR)  20 MG tablet   Oral   Take 20 mg by mouth every evening.          . calcitRIOL (ROCALTROL) 0.25 MCG capsule   Oral   Take 0.25 mcg by mouth every Monday, Wednesday, and Friday.          . ciprofloxacin (CIPRO) 500 MG tablet   Oral   Take 1 tablet (500 mg total) by mouth 2 (two) times daily.   10 tablet   0   . diazepam (VALIUM) 5 MG tablet   Oral   Take 1 tablet (5 mg total) by mouth 2 (two) times daily as needed (for muscle spasm).   10 tablet   0   . dutasteride (AVODART) 0.5 MG capsule   Oral   Take 0.5 mg by mouth every morning.          . ferrous sulfate 325 (65 FE) MG tablet   Oral   Take 325 mg by mouth every morning.          . fish oil-omega-3 fatty acids 1000 MG capsule   Oral   Take 1 g by mouth every evening.          . furosemide (LASIX) 20 MG tablet   Oral   Take 20 mg by mouth  every morning.          [name] glimepiride (AMARYL) 4 MG tablet   Oral   Take 4 mg by mouth 2 (two) times daily.          . insulin glargine (LANTUS SOLOSTAR) 100 UNIT/ML injection   Subcutaneous   Inject 8-16 Units into the skin at bedtime.         . phenazopyridine (PYRIDIUM) 200 MG tablet   Oral   Take 1 tablet (200 mg total) by mouth 3 (three) times daily as needed for pain (will discolor urine orange).   30 tablet   0   . sitaGLIPtin (JANUVIA) 50 MG tablet   Oral   Take 50 mg by mouth every morning.          . tamsulosin (FLOMAX) 0.4 MG CAPS   Oral   Take 0.4 mg by mouth every morning.         [name] telmisartan-hydrochlorothiazide (MICARDIS HCT) 80-12.5 MG per tablet   Oral   Take 1 tablet by mouth every morning.         . traMADol (ULTRAM) 50 MG tablet   Oral   Take 50 mg by mouth every 6 (six) hours as needed (pain).         . traMADol-acetaminophen (ULTRACET) 37.5-325 MG per tablet   Oral   Take 1 tablet by mouth every 6 (six) hours as needed for pain.   30 tablet   2     Notify doctor, [phone], if  Generalized itching o ...   . vitamin B-12 (CYANOCOBALAMIN) 1000 MCG tablet   Oral   Take 1,000 mcg by mouth every morning.            BP 139/49  Pulse 85  Temp(Src) 98.5 F (36.9 C) (Oral)  Resp 20  Wt 158 lb (71.668 kg)  BMI 25.51 kg/m2  SpO2 95%  Physical Exam  Nursing note and vitals reviewed. Constitutional: He appears well-developed and well-nourished. No distress.  HENT:  Head: Normocephalic and atraumatic.  Eyes: Conjunctivae and EOM are normal.  Neck: Normal range of motion. Neck supple. No tracheal deviation present.  Cardiovascular: Normal rate.  Pulmonary/Chest: Effort normal. No respiratory distress.  Musculoskeletal: Normal range of motion.  Neurological: He is alert.  Skin: Skin is warm and dry.  3 cm open, gaping laceration to right lower extremity. Wound is clean and hemostatic  Psychiatric: He has a normal mood and  affect. His behavior is normal.    ED Course  Procedures (including critical care time)  DIAGNOSTIC STUDIES: Oxygen Saturation is 95% on room air, adequate by my interpretation.    COORDINATION OF CARE: 7:05 PM Discussed treatment plan with pt at bedside and pt agreed to plan.    Labs Reviewed - No data to display No results found.   1. Animal bite of lower leg, right, initial encounter    Patient seen and examined.    Vital signs reviewed and are as follows: Filed Vitals:   12/08/12 1857  BP: 139/49  Pulse: 85  Temp: 98.5 F (36.9 C)  Resp: 20   Wound irrigated with 1000 cc sterile water under pressure with 18-gauge Angiocath.  Wound is not a puncture. Patient has a history of diabetes. I discussed the pros and cons of wound closure including improved cosmetic appearance and theoretical possibility of infection given wound closure, although this appears to be low risk wound given non-puncture and early presentation.  Patient requests that I close the wound. I closed using very loose stitches using chromic gut which should lyse if suppurative infection occurs.  Patient counseled on wound care and signs and symptoms that should cause immediate return to the emergency department including worsening pain, redness, swelling, drainage of pus from the wound, fever, streaking redness up his leg.  LACERATION REPAIR Performed by: Faustino Congress Authorized by: Faustino Congress Consent: Verbal consent obtained. Risks and benefits: risks, benefits and alternatives were discussed Consent given by: patient Patient identity confirmed: provided demographic data Prepped and Draped in normal sterile fashion Wound explored  Laceration Location: R lower leg  Laceration Length: 3cm  No Foreign Bodies seen or palpated  Anesthesia: local infiltration  Local anesthetic: lidocaine 2% with epinephrine  Anesthetic total: 3 ml  Irrigation method: syringe and 18g angiocath Amount of  cleaning: extensive  Skin closure: 5-0 chromic gut  Number of sutures: 3  Technique: simple interrupted  Patient tolerance: Patient tolerated the procedure well with no immediate complications.  The patient was urged to return to the Emergency Department urgently with worsening pain, swelling, expanding erythema especially if it streaks away from the affected area, fever, or if they have any other concerns. Patient verbalized understanding.   First dose of antibiotics given in emergency department.   MDM  Patient with shallow dog bites with early presentation. Animal likely has had his rabies vaccination however patient is going to check with animal control tomorrow. Dog is known and the patient is known.  Antibiotics indicated due to 2 history of diabetes.  Wound loosely closed with chromic gut stitches at patient request. Feel wound is low risk for infection. Appropriate irrigation performed.      I personally performed the services described in this documentation, which was scribed in my presence. The recorded information has been reviewed and is accurate.   Carlisle Cater, PA-C 12/08/12 [phone]

## 2012-12-09 NOTE — ED Provider Notes (Signed)
Medical screening examination/treatment/procedure(s) were performed by non-physician practitioner and as supervising physician I was immediately available for consultation/collaboration.    Johnna Acosta, MD 12/09/12 1455

## 2012-12-20 DIAGNOSIS — M9981 Other biomechanical lesions of cervical region: Secondary | ICD-10-CM | POA: Diagnosis not present

## 2012-12-20 DIAGNOSIS — M999 Biomechanical lesion, unspecified: Secondary | ICD-10-CM | POA: Diagnosis not present

## 2012-12-20 DIAGNOSIS — M5030 Other cervical disc degeneration, unspecified cervical region: Secondary | ICD-10-CM | POA: Diagnosis not present

## 2012-12-20 DIAGNOSIS — M5137 Other intervertebral disc degeneration, lumbosacral region: Secondary | ICD-10-CM | POA: Diagnosis not present

## 2012-12-25 DIAGNOSIS — Z6827 Body mass index (BMI) 27.0-27.9, adult: Secondary | ICD-10-CM | POA: Diagnosis not present

## 2012-12-25 DIAGNOSIS — E1129 Type 2 diabetes mellitus with other diabetic kidney complication: Secondary | ICD-10-CM | POA: Diagnosis not present

## 2012-12-25 DIAGNOSIS — T148XXA Other injury of unspecified body region, initial encounter: Secondary | ICD-10-CM | POA: Diagnosis not present

## 2013-01-10 DIAGNOSIS — M9981 Other biomechanical lesions of cervical region: Secondary | ICD-10-CM | POA: Diagnosis not present

## 2013-01-10 DIAGNOSIS — M5137 Other intervertebral disc degeneration, lumbosacral region: Secondary | ICD-10-CM | POA: Diagnosis not present

## 2013-01-10 DIAGNOSIS — M5030 Other cervical disc degeneration, unspecified cervical region: Secondary | ICD-10-CM | POA: Diagnosis not present

## 2013-01-10 DIAGNOSIS — M999 Biomechanical lesion, unspecified: Secondary | ICD-10-CM | POA: Diagnosis not present

## 2013-01-31 DIAGNOSIS — S139XXA Sprain of joints and ligaments of unspecified parts of neck, initial encounter: Secondary | ICD-10-CM | POA: Diagnosis not present

## 2013-01-31 DIAGNOSIS — S335XXA Sprain of ligaments of lumbar spine, initial encounter: Secondary | ICD-10-CM | POA: Diagnosis not present

## 2013-01-31 DIAGNOSIS — M9981 Other biomechanical lesions of cervical region: Secondary | ICD-10-CM | POA: Diagnosis not present

## 2013-01-31 DIAGNOSIS — M999 Biomechanical lesion, unspecified: Secondary | ICD-10-CM | POA: Diagnosis not present

## 2013-02-02 DIAGNOSIS — R82998 Other abnormal findings in urine: Secondary | ICD-10-CM | POA: Diagnosis not present

## 2013-02-02 DIAGNOSIS — E1129 Type 2 diabetes mellitus with other diabetic kidney complication: Secondary | ICD-10-CM | POA: Diagnosis not present

## 2013-02-02 DIAGNOSIS — M109 Gout, unspecified: Secondary | ICD-10-CM | POA: Diagnosis not present

## 2013-02-02 DIAGNOSIS — E785 Hyperlipidemia, unspecified: Secondary | ICD-10-CM | POA: Diagnosis not present

## 2013-02-02 DIAGNOSIS — I10 Essential (primary) hypertension: Secondary | ICD-10-CM | POA: Diagnosis not present

## 2013-02-02 DIAGNOSIS — Z125 Encounter for screening for malignant neoplasm of prostate: Secondary | ICD-10-CM | POA: Diagnosis not present

## 2013-02-09 DIAGNOSIS — H6120 Impacted cerumen, unspecified ear: Secondary | ICD-10-CM | POA: Diagnosis not present

## 2013-02-09 DIAGNOSIS — N2581 Secondary hyperparathyroidism of renal origin: Secondary | ICD-10-CM | POA: Diagnosis not present

## 2013-02-09 DIAGNOSIS — Z125 Encounter for screening for malignant neoplasm of prostate: Secondary | ICD-10-CM | POA: Diagnosis not present

## 2013-02-09 DIAGNOSIS — E785 Hyperlipidemia, unspecified: Secondary | ICD-10-CM | POA: Diagnosis not present

## 2013-02-09 DIAGNOSIS — Z1212 Encounter for screening for malignant neoplasm of rectum: Secondary | ICD-10-CM | POA: Diagnosis not present

## 2013-02-09 DIAGNOSIS — N039 Chronic nephritic syndrome with unspecified morphologic changes: Secondary | ICD-10-CM | POA: Diagnosis not present

## 2013-02-09 DIAGNOSIS — E1129 Type 2 diabetes mellitus with other diabetic kidney complication: Secondary | ICD-10-CM | POA: Diagnosis not present

## 2013-02-09 DIAGNOSIS — D631 Anemia in chronic kidney disease: Secondary | ICD-10-CM | POA: Diagnosis not present

## 2013-02-09 DIAGNOSIS — E11319 Type 2 diabetes mellitus with unspecified diabetic retinopathy without macular edema: Secondary | ICD-10-CM | POA: Diagnosis not present

## 2013-02-09 DIAGNOSIS — I10 Essential (primary) hypertension: Secondary | ICD-10-CM | POA: Diagnosis not present

## 2013-02-09 DIAGNOSIS — D509 Iron deficiency anemia, unspecified: Secondary | ICD-10-CM | POA: Diagnosis not present

## 2013-02-09 DIAGNOSIS — M1990 Unspecified osteoarthritis, unspecified site: Secondary | ICD-10-CM | POA: Diagnosis not present

## 2013-02-09 DIAGNOSIS — Z0000 Encounter for general adult medical examination without abnormal findings: Secondary | ICD-10-CM | POA: Diagnosis not present

## 2013-02-14 DIAGNOSIS — M542 Cervicalgia: Secondary | ICD-10-CM | POA: Diagnosis not present

## 2013-02-14 DIAGNOSIS — M999 Biomechanical lesion, unspecified: Secondary | ICD-10-CM | POA: Diagnosis not present

## 2013-02-14 DIAGNOSIS — M546 Pain in thoracic spine: Secondary | ICD-10-CM | POA: Diagnosis not present

## 2013-02-14 DIAGNOSIS — M9981 Other biomechanical lesions of cervical region: Secondary | ICD-10-CM | POA: Diagnosis not present

## 2013-02-26 DIAGNOSIS — N401 Enlarged prostate with lower urinary tract symptoms: Secondary | ICD-10-CM | POA: Diagnosis not present

## 2013-03-14 DIAGNOSIS — M5030 Other cervical disc degeneration, unspecified cervical region: Secondary | ICD-10-CM | POA: Diagnosis not present

## 2013-03-14 DIAGNOSIS — M546 Pain in thoracic spine: Secondary | ICD-10-CM | POA: Diagnosis not present

## 2013-03-14 DIAGNOSIS — M9981 Other biomechanical lesions of cervical region: Secondary | ICD-10-CM | POA: Diagnosis not present

## 2013-03-14 DIAGNOSIS — M999 Biomechanical lesion, unspecified: Secondary | ICD-10-CM | POA: Diagnosis not present

## 2013-03-22 DIAGNOSIS — E119 Type 2 diabetes mellitus without complications: Secondary | ICD-10-CM | POA: Diagnosis not present

## 2013-03-22 DIAGNOSIS — Z961 Presence of intraocular lens: Secondary | ICD-10-CM | POA: Diagnosis not present

## 2013-03-29 DIAGNOSIS — N2581 Secondary hyperparathyroidism of renal origin: Secondary | ICD-10-CM | POA: Diagnosis not present

## 2013-03-29 DIAGNOSIS — D509 Iron deficiency anemia, unspecified: Secondary | ICD-10-CM | POA: Diagnosis not present

## 2013-03-29 DIAGNOSIS — I10 Essential (primary) hypertension: Secondary | ICD-10-CM | POA: Diagnosis not present

## 2013-04-02 DIAGNOSIS — E875 Hyperkalemia: Secondary | ICD-10-CM | POA: Diagnosis not present

## 2013-04-11 DIAGNOSIS — S139XXA Sprain of joints and ligaments of unspecified parts of neck, initial encounter: Secondary | ICD-10-CM | POA: Diagnosis not present

## 2013-04-11 DIAGNOSIS — M999 Biomechanical lesion, unspecified: Secondary | ICD-10-CM | POA: Diagnosis not present

## 2013-04-11 DIAGNOSIS — M9981 Other biomechanical lesions of cervical region: Secondary | ICD-10-CM | POA: Diagnosis not present

## 2013-04-13 IMAGING — CT CT CHEST W/O CM
1 of 2 series · 14 of 32 positions shown, 18 images · non-contrast
Comparison: Chest radiograph 11/06/2012, chest radiograph
03/29/2005

CLINICAL DATA: Fall, shoulder pain, evaluate for fracture.

CT CHEST WITHOUT CONTRAST
TECHNIQUE: Multidetector CT imaging of the chest was performed
following the standard protocol without IV contrast.

[Series 2: chest w/o st · axial · non-contrast · 0.73mm/px · z∈[-265,-15]mm · 14 of 60 slices shown, 18 images]
[im 5/60  mediastinal]
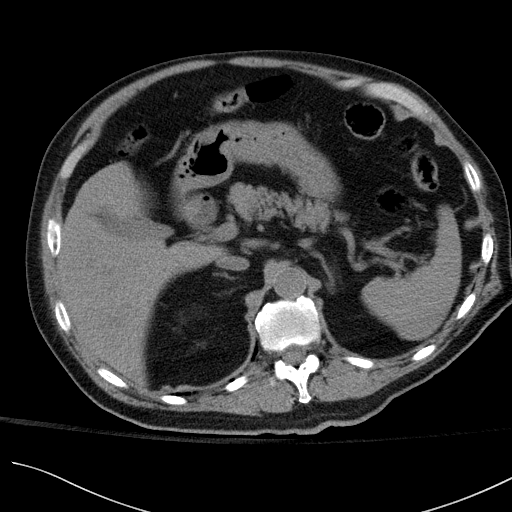
[im 5/60  lung]
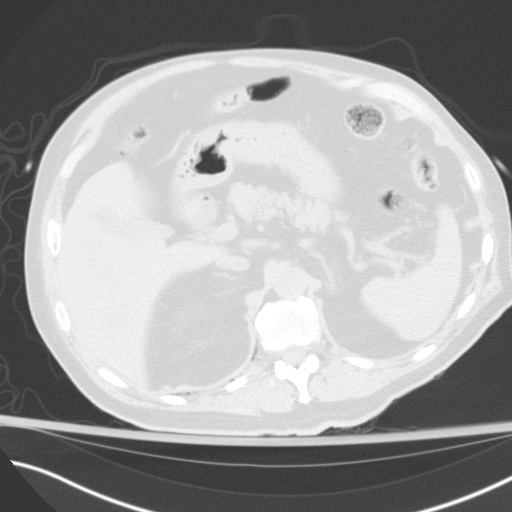
[im 10/60  lung]
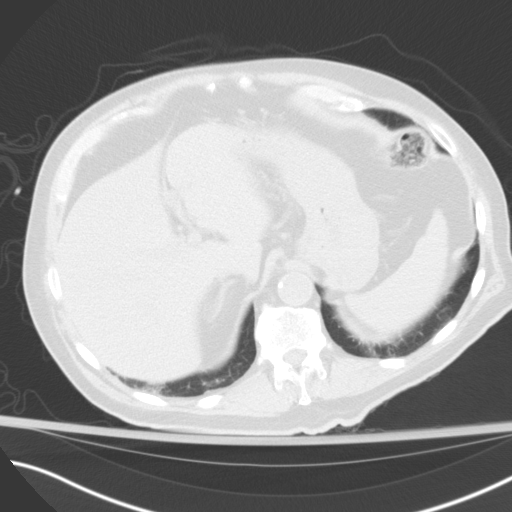
[im 14/60  lung]
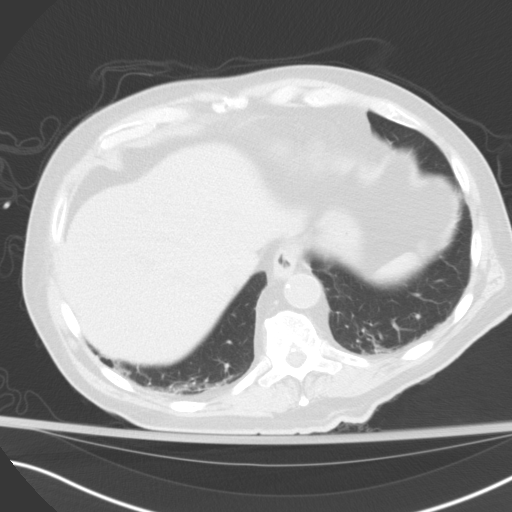
[im 19/60  lung]
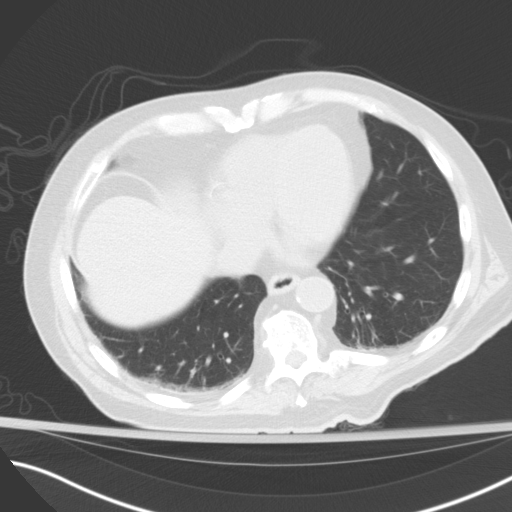
[im 23/60  mediastinal]
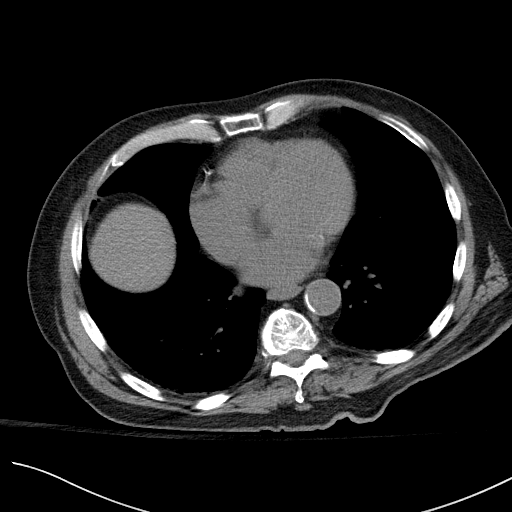
[im 23/60  lung]
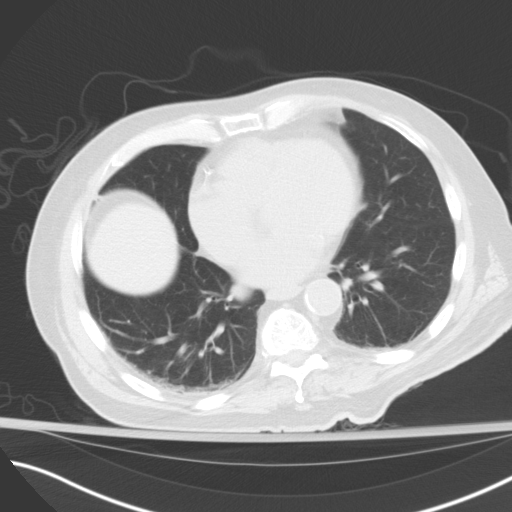
[im 28/60  lung]
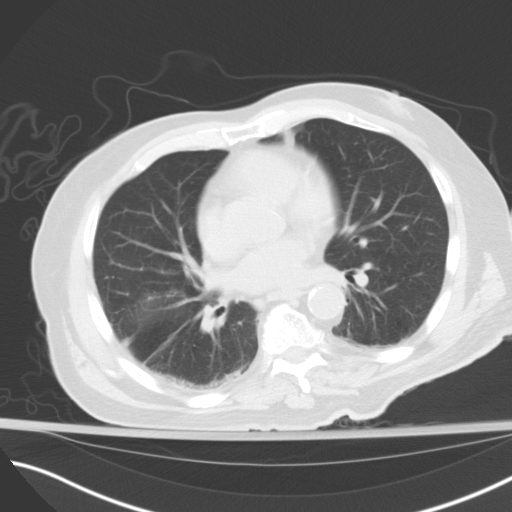
[im 29/60  lung]
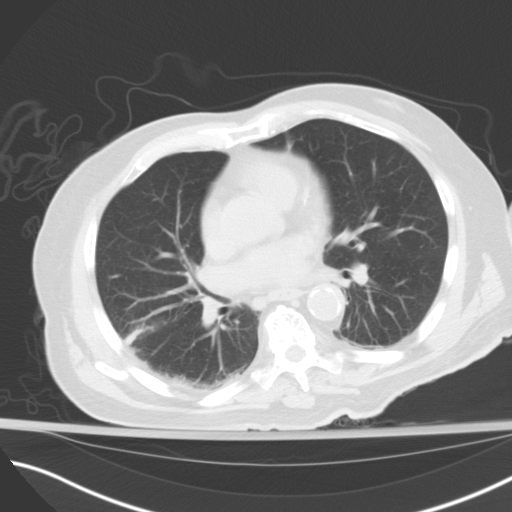
[im 30/60  lung]
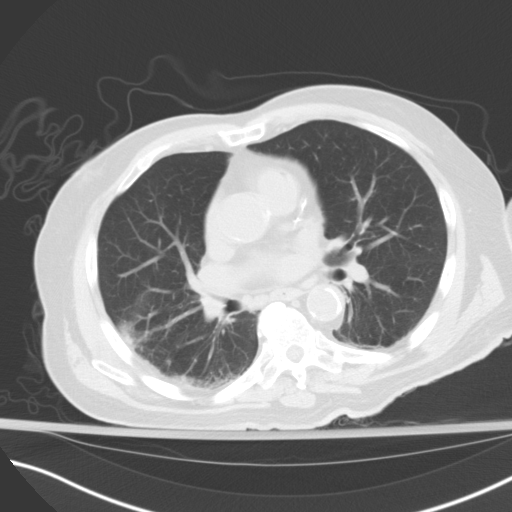
[im 32/60  mediastinal]
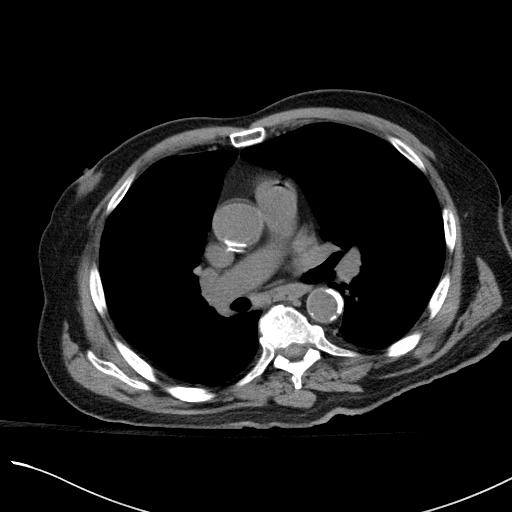
[im 32/60  lung]
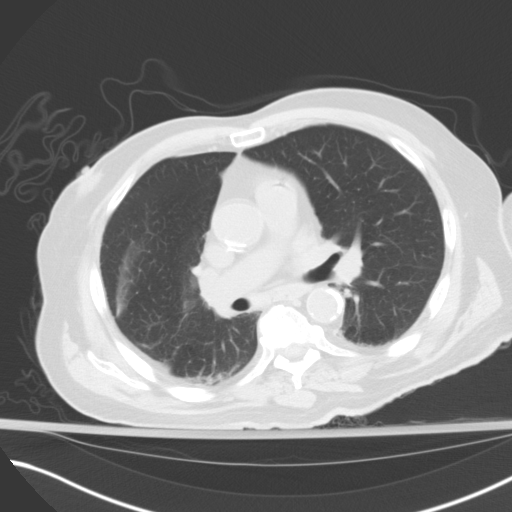
[im 37/60  lung]
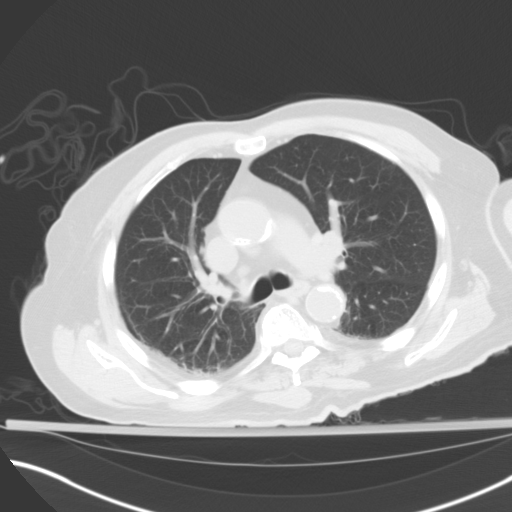
[im 41/60  lung]
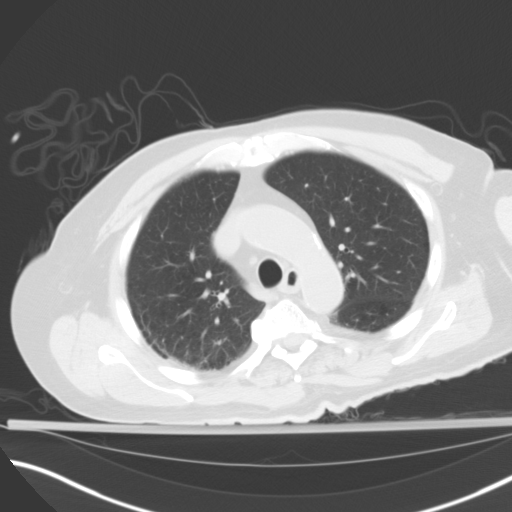
[im 46/60  lung]
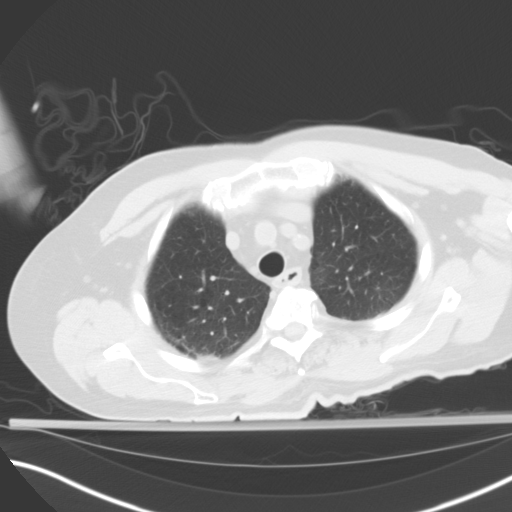
[im 50/60  mediastinal]
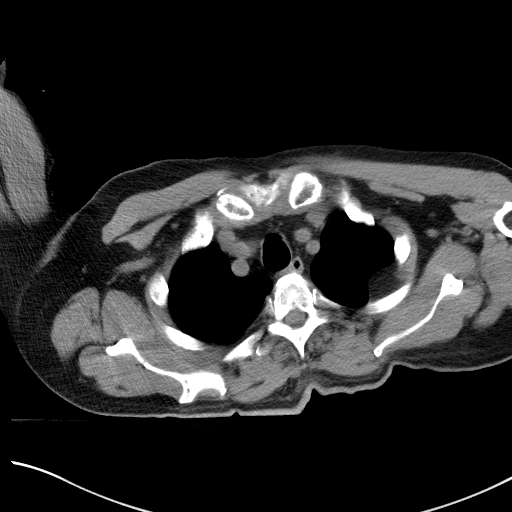
[im 50/60  lung]
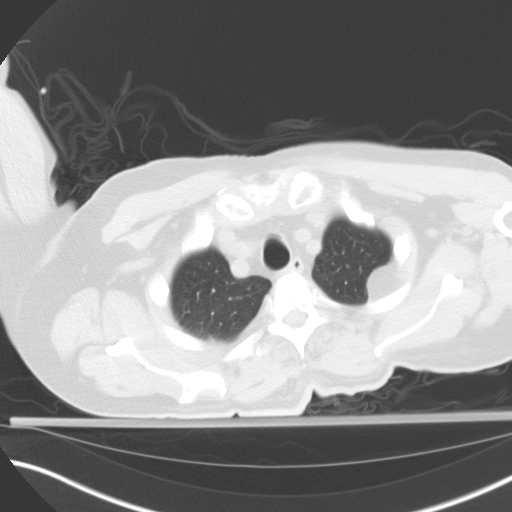
[im 55/60  lung]
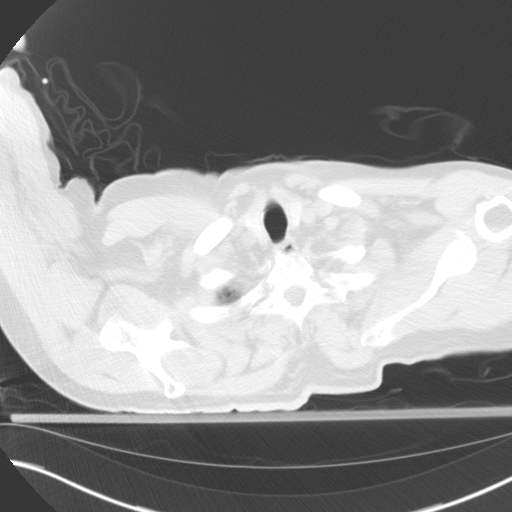

[14 of 32 positions shown; findings below may reference images not displayed]

FINDINGS: No evidence of fracture of the left or right scapula, no
rib fracture or sternal fracture.  No evidence of spine fracture.
There is degenerative spurring of the thoracic spine.

Non-IV contrast images demonstrate no evidence of mediastinal
hematoma or aortic injury.  No pericardial fluid.

There is no pneumothorax or pulmonary contusion.  There are is a
rounded pleural thickening in the left upper lobe measuring 20 mm
in depth which has is a very low density most consistent with a
subpleural lipoma.

Limited view of the upper abdomen demonstrates no acute findings.
IMPRESSION: 1..  No evidence of thoracic trauma.
2. Subpleural lipoma in the left upper hemithorax.

## 2013-05-09 DIAGNOSIS — M999 Biomechanical lesion, unspecified: Secondary | ICD-10-CM | POA: Diagnosis not present

## 2013-05-09 DIAGNOSIS — M9981 Other biomechanical lesions of cervical region: Secondary | ICD-10-CM | POA: Diagnosis not present

## 2013-05-09 DIAGNOSIS — S139XXA Sprain of joints and ligaments of unspecified parts of neck, initial encounter: Secondary | ICD-10-CM | POA: Diagnosis not present

## 2013-06-06 DIAGNOSIS — Z23 Encounter for immunization: Secondary | ICD-10-CM | POA: Diagnosis not present

## 2013-06-07 DIAGNOSIS — M5030 Other cervical disc degeneration, unspecified cervical region: Secondary | ICD-10-CM | POA: Diagnosis not present

## 2013-06-07 DIAGNOSIS — M546 Pain in thoracic spine: Secondary | ICD-10-CM | POA: Diagnosis not present

## 2013-06-07 DIAGNOSIS — M9981 Other biomechanical lesions of cervical region: Secondary | ICD-10-CM | POA: Diagnosis not present

## 2013-06-07 DIAGNOSIS — M999 Biomechanical lesion, unspecified: Secondary | ICD-10-CM | POA: Diagnosis not present

## 2013-06-19 DIAGNOSIS — N62 Hypertrophy of breast: Secondary | ICD-10-CM | POA: Diagnosis not present

## 2013-06-19 DIAGNOSIS — N401 Enlarged prostate with lower urinary tract symptoms: Secondary | ICD-10-CM | POA: Diagnosis not present

## 2013-06-19 DIAGNOSIS — R3129 Other microscopic hematuria: Secondary | ICD-10-CM | POA: Diagnosis not present

## 2013-06-20 ENCOUNTER — Other Ambulatory Visit: Payer: Self-pay | Admitting: Urology

## 2013-06-20 DIAGNOSIS — N644 Mastodynia: Secondary | ICD-10-CM

## 2013-06-27 DIAGNOSIS — N281 Cyst of kidney, acquired: Secondary | ICD-10-CM | POA: Diagnosis not present

## 2013-06-27 DIAGNOSIS — R3129 Other microscopic hematuria: Secondary | ICD-10-CM | POA: Diagnosis not present

## 2013-07-03 ENCOUNTER — Other Ambulatory Visit: Payer: Medicare Other

## 2013-07-03 DIAGNOSIS — M9981 Other biomechanical lesions of cervical region: Secondary | ICD-10-CM | POA: Diagnosis not present

## 2013-07-03 DIAGNOSIS — M999 Biomechanical lesion, unspecified: Secondary | ICD-10-CM | POA: Diagnosis not present

## 2013-07-03 DIAGNOSIS — M5030 Other cervical disc degeneration, unspecified cervical region: Secondary | ICD-10-CM | POA: Diagnosis not present

## 2013-07-03 DIAGNOSIS — M546 Pain in thoracic spine: Secondary | ICD-10-CM | POA: Diagnosis not present

## 2013-07-10 ENCOUNTER — Ambulatory Visit
Admission: RE | Admit: 2013-07-10 | Discharge: 2013-07-10 | Disposition: A | Discharge status: Home / Self Care | Payer: Medicare Other | Source: Ambulatory Visit | Attending: Urology | Admitting: Urology

## 2013-07-10 DIAGNOSIS — N644 Mastodynia: Secondary | ICD-10-CM

## 2013-07-10 DIAGNOSIS — N62 Hypertrophy of breast: Secondary | ICD-10-CM | POA: Diagnosis not present

## 2013-07-11 DIAGNOSIS — D509 Iron deficiency anemia, unspecified: Secondary | ICD-10-CM | POA: Diagnosis not present

## 2013-07-11 DIAGNOSIS — N2581 Secondary hyperparathyroidism of renal origin: Secondary | ICD-10-CM | POA: Diagnosis not present

## 2013-07-11 DIAGNOSIS — I10 Essential (primary) hypertension: Secondary | ICD-10-CM | POA: Diagnosis not present

## 2013-07-13 ENCOUNTER — Other Ambulatory Visit (HOSPITAL_COMMUNITY): Payer: Self-pay | Admitting: Urology

## 2013-07-13 DIAGNOSIS — N2889 Other specified disorders of kidney and ureter: Secondary | ICD-10-CM

## 2013-07-23 ENCOUNTER — Ambulatory Visit (HOSPITAL_COMMUNITY)
Admission: RE | Admit: 2013-07-23 | Discharge: 2013-07-23 | Disposition: A | Discharge status: Home / Self Care | Payer: Medicare Other | Source: Ambulatory Visit | Attending: Urology | Admitting: Urology

## 2013-07-27 DIAGNOSIS — N139 Obstructive and reflux uropathy, unspecified: Secondary | ICD-10-CM | POA: Diagnosis not present

## 2013-07-27 DIAGNOSIS — N401 Enlarged prostate with lower urinary tract symptoms: Secondary | ICD-10-CM | POA: Diagnosis not present

## 2013-08-13 DIAGNOSIS — Z23 Encounter for immunization: Secondary | ICD-10-CM | POA: Diagnosis not present

## 2013-08-13 DIAGNOSIS — I10 Essential (primary) hypertension: Secondary | ICD-10-CM | POA: Diagnosis not present

## 2013-08-13 DIAGNOSIS — D631 Anemia in chronic kidney disease: Secondary | ICD-10-CM | POA: Diagnosis not present

## 2013-08-13 DIAGNOSIS — N401 Enlarged prostate with lower urinary tract symptoms: Secondary | ICD-10-CM | POA: Diagnosis not present

## 2013-08-13 DIAGNOSIS — E11319 Type 2 diabetes mellitus with unspecified diabetic retinopathy without macular edema: Secondary | ICD-10-CM | POA: Diagnosis not present

## 2013-08-13 DIAGNOSIS — M109 Gout, unspecified: Secondary | ICD-10-CM | POA: Diagnosis not present

## 2013-08-13 DIAGNOSIS — E785 Hyperlipidemia, unspecified: Secondary | ICD-10-CM | POA: Diagnosis not present

## 2013-08-13 DIAGNOSIS — E1129 Type 2 diabetes mellitus with other diabetic kidney complication: Secondary | ICD-10-CM | POA: Diagnosis not present

## 2013-09-12 DIAGNOSIS — J189 Pneumonia, unspecified organism: Secondary | ICD-10-CM | POA: Diagnosis not present

## 2013-09-12 DIAGNOSIS — R059 Cough, unspecified: Secondary | ICD-10-CM | POA: Diagnosis not present

## 2013-09-12 DIAGNOSIS — R05 Cough: Secondary | ICD-10-CM | POA: Diagnosis not present

## 2013-09-20 DIAGNOSIS — I951 Orthostatic hypotension: Secondary | ICD-10-CM | POA: Diagnosis not present

## 2013-09-20 DIAGNOSIS — J189 Pneumonia, unspecified organism: Secondary | ICD-10-CM | POA: Diagnosis not present

## 2013-09-20 DIAGNOSIS — R059 Cough, unspecified: Secondary | ICD-10-CM | POA: Diagnosis not present

## 2013-09-20 DIAGNOSIS — R05 Cough: Secondary | ICD-10-CM | POA: Diagnosis not present

## 2013-09-20 DIAGNOSIS — R112 Nausea with vomiting, unspecified: Secondary | ICD-10-CM | POA: Diagnosis not present

## 2013-09-20 DIAGNOSIS — I10 Essential (primary) hypertension: Secondary | ICD-10-CM | POA: Diagnosis not present

## 2013-09-20 DIAGNOSIS — E1129 Type 2 diabetes mellitus with other diabetic kidney complication: Secondary | ICD-10-CM | POA: Diagnosis not present

## 2013-09-20 DIAGNOSIS — R42 Dizziness and giddiness: Secondary | ICD-10-CM | POA: Diagnosis not present

## 2013-09-20 DIAGNOSIS — N183 Chronic kidney disease, stage 3 unspecified: Secondary | ICD-10-CM | POA: Diagnosis not present

## 2013-10-15 DIAGNOSIS — E7800 Pure hypercholesterolemia, unspecified: Secondary | ICD-10-CM | POA: Diagnosis not present

## 2013-10-15 DIAGNOSIS — N183 Chronic kidney disease, stage 3 unspecified: Secondary | ICD-10-CM | POA: Diagnosis not present

## 2013-10-15 DIAGNOSIS — N039 Chronic nephritic syndrome with unspecified morphologic changes: Secondary | ICD-10-CM | POA: Diagnosis not present

## 2013-10-15 DIAGNOSIS — N2581 Secondary hyperparathyroidism of renal origin: Secondary | ICD-10-CM | POA: Diagnosis not present

## 2013-10-15 DIAGNOSIS — D631 Anemia in chronic kidney disease: Secondary | ICD-10-CM | POA: Diagnosis not present

## 2013-10-15 DIAGNOSIS — I129 Hypertensive chronic kidney disease with stage 1 through stage 4 chronic kidney disease, or unspecified chronic kidney disease: Secondary | ICD-10-CM | POA: Diagnosis not present

## 2013-10-15 DIAGNOSIS — D509 Iron deficiency anemia, unspecified: Secondary | ICD-10-CM | POA: Diagnosis not present

## 2013-10-15 DIAGNOSIS — M109 Gout, unspecified: Secondary | ICD-10-CM | POA: Diagnosis not present

## 2013-11-12 DIAGNOSIS — N183 Chronic kidney disease, stage 3 unspecified: Secondary | ICD-10-CM | POA: Diagnosis not present

## 2013-11-12 DIAGNOSIS — I10 Essential (primary) hypertension: Secondary | ICD-10-CM | POA: Diagnosis not present

## 2013-11-12 DIAGNOSIS — E1129 Type 2 diabetes mellitus with other diabetic kidney complication: Secondary | ICD-10-CM | POA: Diagnosis not present

## 2013-11-12 DIAGNOSIS — M1990 Unspecified osteoarthritis, unspecified site: Secondary | ICD-10-CM | POA: Diagnosis not present

## 2013-11-12 DIAGNOSIS — M109 Gout, unspecified: Secondary | ICD-10-CM | POA: Diagnosis not present

## 2013-11-12 DIAGNOSIS — E785 Hyperlipidemia, unspecified: Secondary | ICD-10-CM | POA: Diagnosis not present

## 2013-11-12 DIAGNOSIS — Z1331 Encounter for screening for depression: Secondary | ICD-10-CM | POA: Diagnosis not present

## 2013-11-12 DIAGNOSIS — E11319 Type 2 diabetes mellitus with unspecified diabetic retinopathy without macular edema: Secondary | ICD-10-CM | POA: Diagnosis not present

## 2013-11-12 DIAGNOSIS — N401 Enlarged prostate with lower urinary tract symptoms: Secondary | ICD-10-CM | POA: Diagnosis not present

## 2013-12-04 DIAGNOSIS — E1129 Type 2 diabetes mellitus with other diabetic kidney complication: Secondary | ICD-10-CM | POA: Diagnosis not present

## 2013-12-04 DIAGNOSIS — I10 Essential (primary) hypertension: Secondary | ICD-10-CM | POA: Diagnosis not present

## 2013-12-04 DIAGNOSIS — N183 Chronic kidney disease, stage 3 unspecified: Secondary | ICD-10-CM | POA: Diagnosis not present

## 2013-12-04 DIAGNOSIS — Z6827 Body mass index (BMI) 27.0-27.9, adult: Secondary | ICD-10-CM | POA: Diagnosis not present

## 2014-01-30 DIAGNOSIS — N289 Disorder of kidney and ureter, unspecified: Secondary | ICD-10-CM | POA: Diagnosis not present

## 2014-01-31 DIAGNOSIS — N289 Disorder of kidney and ureter, unspecified: Secondary | ICD-10-CM | POA: Diagnosis not present

## 2014-02-06 DIAGNOSIS — Z125 Encounter for screening for malignant neoplasm of prostate: Secondary | ICD-10-CM | POA: Diagnosis not present

## 2014-02-06 DIAGNOSIS — M109 Gout, unspecified: Secondary | ICD-10-CM | POA: Diagnosis not present

## 2014-02-06 DIAGNOSIS — E1129 Type 2 diabetes mellitus with other diabetic kidney complication: Secondary | ICD-10-CM | POA: Diagnosis not present

## 2014-02-13 DIAGNOSIS — E11319 Type 2 diabetes mellitus with unspecified diabetic retinopathy without macular edema: Secondary | ICD-10-CM | POA: Diagnosis not present

## 2014-02-13 DIAGNOSIS — E1129 Type 2 diabetes mellitus with other diabetic kidney complication: Secondary | ICD-10-CM | POA: Diagnosis not present

## 2014-02-13 DIAGNOSIS — Z1331 Encounter for screening for depression: Secondary | ICD-10-CM | POA: Diagnosis not present

## 2014-02-13 DIAGNOSIS — N401 Enlarged prostate with lower urinary tract symptoms: Secondary | ICD-10-CM | POA: Diagnosis not present

## 2014-02-13 DIAGNOSIS — Z0000 Encounter for general adult medical examination without abnormal findings: Secondary | ICD-10-CM | POA: Diagnosis not present

## 2014-02-13 DIAGNOSIS — N289 Disorder of kidney and ureter, unspecified: Secondary | ICD-10-CM | POA: Diagnosis not present

## 2014-02-13 DIAGNOSIS — I10 Essential (primary) hypertension: Secondary | ICD-10-CM | POA: Diagnosis not present

## 2014-02-13 DIAGNOSIS — N183 Chronic kidney disease, stage 3 unspecified: Secondary | ICD-10-CM | POA: Diagnosis not present

## 2014-02-13 DIAGNOSIS — E785 Hyperlipidemia, unspecified: Secondary | ICD-10-CM | POA: Diagnosis not present

## 2014-02-14 DIAGNOSIS — Z1212 Encounter for screening for malignant neoplasm of rectum: Secondary | ICD-10-CM | POA: Diagnosis not present

## 2014-03-25 DIAGNOSIS — E109 Type 1 diabetes mellitus without complications: Secondary | ICD-10-CM | POA: Diagnosis not present

## 2014-03-25 DIAGNOSIS — H04129 Dry eye syndrome of unspecified lacrimal gland: Secondary | ICD-10-CM | POA: Diagnosis not present

## 2014-06-05 DIAGNOSIS — Z23 Encounter for immunization: Secondary | ICD-10-CM | POA: Diagnosis not present

## 2014-07-24 DIAGNOSIS — D631 Anemia in chronic kidney disease: Secondary | ICD-10-CM | POA: Diagnosis not present

## 2014-07-24 DIAGNOSIS — M109 Gout, unspecified: Secondary | ICD-10-CM | POA: Diagnosis not present

## 2014-07-24 DIAGNOSIS — N189 Chronic kidney disease, unspecified: Secondary | ICD-10-CM | POA: Diagnosis not present

## 2014-07-24 DIAGNOSIS — D509 Iron deficiency anemia, unspecified: Secondary | ICD-10-CM | POA: Diagnosis not present

## 2014-07-24 DIAGNOSIS — N2581 Secondary hyperparathyroidism of renal origin: Secondary | ICD-10-CM | POA: Diagnosis not present

## 2014-07-24 DIAGNOSIS — I129 Hypertensive chronic kidney disease with stage 1 through stage 4 chronic kidney disease, or unspecified chronic kidney disease: Secondary | ICD-10-CM | POA: Diagnosis not present

## 2014-07-24 DIAGNOSIS — E780 Pure hypercholesterolemia: Secondary | ICD-10-CM | POA: Diagnosis not present

## 2014-07-24 DIAGNOSIS — N183 Chronic kidney disease, stage 3 (moderate): Secondary | ICD-10-CM | POA: Diagnosis not present

## 2014-08-19 DIAGNOSIS — E1129 Type 2 diabetes mellitus with other diabetic kidney complication: Secondary | ICD-10-CM | POA: Diagnosis not present

## 2014-08-19 DIAGNOSIS — I10 Essential (primary) hypertension: Secondary | ICD-10-CM | POA: Diagnosis not present

## 2014-08-19 DIAGNOSIS — E11319 Type 2 diabetes mellitus with unspecified diabetic retinopathy without macular edema: Secondary | ICD-10-CM | POA: Diagnosis not present

## 2014-08-19 DIAGNOSIS — E785 Hyperlipidemia, unspecified: Secondary | ICD-10-CM | POA: Diagnosis not present

## 2014-08-19 DIAGNOSIS — R809 Proteinuria, unspecified: Secondary | ICD-10-CM | POA: Diagnosis not present

## 2014-08-19 DIAGNOSIS — N183 Chronic kidney disease, stage 3 (moderate): Secondary | ICD-10-CM | POA: Diagnosis not present

## 2014-08-19 DIAGNOSIS — N401 Enlarged prostate with lower urinary tract symptoms: Secondary | ICD-10-CM | POA: Diagnosis not present

## 2014-08-19 DIAGNOSIS — M109 Gout, unspecified: Secondary | ICD-10-CM | POA: Diagnosis not present

## 2014-11-19 DIAGNOSIS — N401 Enlarged prostate with lower urinary tract symptoms: Secondary | ICD-10-CM | POA: Diagnosis not present

## 2014-11-19 DIAGNOSIS — N183 Chronic kidney disease, stage 3 (moderate): Secondary | ICD-10-CM | POA: Diagnosis not present

## 2014-11-19 DIAGNOSIS — M1990 Unspecified osteoarthritis, unspecified site: Secondary | ICD-10-CM | POA: Diagnosis not present

## 2014-11-19 DIAGNOSIS — M109 Gout, unspecified: Secondary | ICD-10-CM | POA: Diagnosis not present

## 2014-11-19 DIAGNOSIS — E11319 Type 2 diabetes mellitus with unspecified diabetic retinopathy without macular edema: Secondary | ICD-10-CM | POA: Diagnosis not present

## 2014-11-19 DIAGNOSIS — R809 Proteinuria, unspecified: Secondary | ICD-10-CM | POA: Diagnosis not present

## 2014-11-19 DIAGNOSIS — Z6826 Body mass index (BMI) 26.0-26.9, adult: Secondary | ICD-10-CM | POA: Diagnosis not present

## 2014-11-19 DIAGNOSIS — I10 Essential (primary) hypertension: Secondary | ICD-10-CM | POA: Diagnosis not present

## 2014-11-19 DIAGNOSIS — E1129 Type 2 diabetes mellitus with other diabetic kidney complication: Secondary | ICD-10-CM | POA: Diagnosis not present

## 2014-11-19 DIAGNOSIS — E785 Hyperlipidemia, unspecified: Secondary | ICD-10-CM | POA: Diagnosis not present

## 2014-11-19 DIAGNOSIS — D631 Anemia in chronic kidney disease: Secondary | ICD-10-CM | POA: Diagnosis not present

## 2015-01-22 DIAGNOSIS — H10501 Unspecified blepharoconjunctivitis, right eye: Secondary | ICD-10-CM | POA: Diagnosis not present

## 2015-01-27 DIAGNOSIS — H10501 Unspecified blepharoconjunctivitis, right eye: Secondary | ICD-10-CM | POA: Diagnosis not present

## 2015-02-18 DIAGNOSIS — N2889 Other specified disorders of kidney and ureter: Secondary | ICD-10-CM | POA: Diagnosis not present

## 2015-02-18 DIAGNOSIS — N5201 Erectile dysfunction due to arterial insufficiency: Secondary | ICD-10-CM | POA: Diagnosis not present

## 2015-02-19 DIAGNOSIS — Z125 Encounter for screening for malignant neoplasm of prostate: Secondary | ICD-10-CM | POA: Diagnosis not present

## 2015-02-19 DIAGNOSIS — M109 Gout, unspecified: Secondary | ICD-10-CM | POA: Diagnosis not present

## 2015-02-19 DIAGNOSIS — E785 Hyperlipidemia, unspecified: Secondary | ICD-10-CM | POA: Diagnosis not present

## 2015-02-19 DIAGNOSIS — E1129 Type 2 diabetes mellitus with other diabetic kidney complication: Secondary | ICD-10-CM | POA: Diagnosis not present

## 2015-02-26 DIAGNOSIS — Z1389 Encounter for screening for other disorder: Secondary | ICD-10-CM | POA: Diagnosis not present

## 2015-02-26 DIAGNOSIS — M109 Gout, unspecified: Secondary | ICD-10-CM | POA: Diagnosis not present

## 2015-02-26 DIAGNOSIS — N320 Bladder-neck obstruction: Secondary | ICD-10-CM | POA: Diagnosis not present

## 2015-02-26 DIAGNOSIS — Z0000 Encounter for general adult medical examination without abnormal findings: Secondary | ICD-10-CM | POA: Diagnosis not present

## 2015-02-26 DIAGNOSIS — Z6826 Body mass index (BMI) 26.0-26.9, adult: Secondary | ICD-10-CM | POA: Diagnosis not present

## 2015-02-26 DIAGNOSIS — N2581 Secondary hyperparathyroidism of renal origin: Secondary | ICD-10-CM | POA: Diagnosis not present

## 2015-02-26 DIAGNOSIS — D509 Iron deficiency anemia, unspecified: Secondary | ICD-10-CM | POA: Diagnosis not present

## 2015-02-26 DIAGNOSIS — N2889 Other specified disorders of kidney and ureter: Secondary | ICD-10-CM | POA: Diagnosis not present

## 2015-02-26 DIAGNOSIS — Z794 Long term (current) use of insulin: Secondary | ICD-10-CM | POA: Diagnosis not present

## 2015-02-26 DIAGNOSIS — R809 Proteinuria, unspecified: Secondary | ICD-10-CM | POA: Diagnosis not present

## 2015-02-26 DIAGNOSIS — Z1212 Encounter for screening for malignant neoplasm of rectum: Secondary | ICD-10-CM | POA: Diagnosis not present

## 2015-02-26 DIAGNOSIS — R312 Other microscopic hematuria: Secondary | ICD-10-CM | POA: Diagnosis not present

## 2015-02-26 DIAGNOSIS — I129 Hypertensive chronic kidney disease with stage 1 through stage 4 chronic kidney disease, or unspecified chronic kidney disease: Secondary | ICD-10-CM | POA: Diagnosis not present

## 2015-04-03 DIAGNOSIS — Z6827 Body mass index (BMI) 27.0-27.9, adult: Secondary | ICD-10-CM | POA: Diagnosis not present

## 2015-04-03 DIAGNOSIS — L259 Unspecified contact dermatitis, unspecified cause: Secondary | ICD-10-CM | POA: Diagnosis not present

## 2015-04-10 DIAGNOSIS — E109 Type 1 diabetes mellitus without complications: Secondary | ICD-10-CM | POA: Diagnosis not present

## 2015-04-10 DIAGNOSIS — Z961 Presence of intraocular lens: Secondary | ICD-10-CM | POA: Diagnosis not present

## 2015-05-08 DIAGNOSIS — N183 Chronic kidney disease, stage 3 (moderate): Secondary | ICD-10-CM | POA: Diagnosis not present

## 2015-05-08 DIAGNOSIS — D631 Anemia in chronic kidney disease: Secondary | ICD-10-CM | POA: Diagnosis not present

## 2015-05-08 DIAGNOSIS — E119 Type 2 diabetes mellitus without complications: Secondary | ICD-10-CM | POA: Diagnosis not present

## 2015-05-08 DIAGNOSIS — I129 Hypertensive chronic kidney disease with stage 1 through stage 4 chronic kidney disease, or unspecified chronic kidney disease: Secondary | ICD-10-CM | POA: Diagnosis not present

## 2015-05-23 DIAGNOSIS — L259 Unspecified contact dermatitis, unspecified cause: Secondary | ICD-10-CM | POA: Diagnosis not present

## 2015-05-23 DIAGNOSIS — E1129 Type 2 diabetes mellitus with other diabetic kidney complication: Secondary | ICD-10-CM | POA: Diagnosis not present

## 2015-05-23 DIAGNOSIS — Z6827 Body mass index (BMI) 27.0-27.9, adult: Secondary | ICD-10-CM | POA: Diagnosis not present

## 2015-05-23 DIAGNOSIS — Z23 Encounter for immunization: Secondary | ICD-10-CM | POA: Diagnosis not present

## 2015-06-11 DIAGNOSIS — D509 Iron deficiency anemia, unspecified: Secondary | ICD-10-CM | POA: Diagnosis not present

## 2015-06-11 DIAGNOSIS — M109 Gout, unspecified: Secondary | ICD-10-CM | POA: Diagnosis not present

## 2015-06-11 DIAGNOSIS — N320 Bladder-neck obstruction: Secondary | ICD-10-CM | POA: Diagnosis not present

## 2015-06-11 DIAGNOSIS — E1129 Type 2 diabetes mellitus with other diabetic kidney complication: Secondary | ICD-10-CM | POA: Diagnosis not present

## 2015-06-11 DIAGNOSIS — D631 Anemia in chronic kidney disease: Secondary | ICD-10-CM | POA: Diagnosis not present

## 2015-06-11 DIAGNOSIS — N2581 Secondary hyperparathyroidism of renal origin: Secondary | ICD-10-CM | POA: Diagnosis not present

## 2015-06-11 DIAGNOSIS — H9193 Unspecified hearing loss, bilateral: Secondary | ICD-10-CM | POA: Diagnosis not present

## 2015-06-11 DIAGNOSIS — D692 Other nonthrombocytopenic purpura: Secondary | ICD-10-CM | POA: Diagnosis not present

## 2015-06-11 DIAGNOSIS — I129 Hypertensive chronic kidney disease with stage 1 through stage 4 chronic kidney disease, or unspecified chronic kidney disease: Secondary | ICD-10-CM | POA: Diagnosis not present

## 2015-06-11 DIAGNOSIS — R3121 Asymptomatic microscopic hematuria: Secondary | ICD-10-CM | POA: Diagnosis not present

## 2015-06-11 DIAGNOSIS — R809 Proteinuria, unspecified: Secondary | ICD-10-CM | POA: Diagnosis not present

## 2015-06-11 DIAGNOSIS — Z6827 Body mass index (BMI) 27.0-27.9, adult: Secondary | ICD-10-CM | POA: Diagnosis not present

## 2015-09-17 DIAGNOSIS — Z6826 Body mass index (BMI) 26.0-26.9, adult: Secondary | ICD-10-CM | POA: Diagnosis not present

## 2015-09-17 DIAGNOSIS — M1990 Unspecified osteoarthritis, unspecified site: Secondary | ICD-10-CM | POA: Diagnosis not present

## 2015-09-17 DIAGNOSIS — D509 Iron deficiency anemia, unspecified: Secondary | ICD-10-CM | POA: Diagnosis not present

## 2015-09-17 DIAGNOSIS — N401 Enlarged prostate with lower urinary tract symptoms: Secondary | ICD-10-CM | POA: Diagnosis not present

## 2015-09-17 DIAGNOSIS — F5221 Male erectile disorder: Secondary | ICD-10-CM | POA: Diagnosis not present

## 2015-09-17 DIAGNOSIS — N183 Chronic kidney disease, stage 3 (moderate): Secondary | ICD-10-CM | POA: Diagnosis not present

## 2015-09-17 DIAGNOSIS — M109 Gout, unspecified: Secondary | ICD-10-CM | POA: Diagnosis not present

## 2015-09-17 DIAGNOSIS — Z1389 Encounter for screening for other disorder: Secondary | ICD-10-CM | POA: Diagnosis not present

## 2015-09-17 DIAGNOSIS — E1129 Type 2 diabetes mellitus with other diabetic kidney complication: Secondary | ICD-10-CM | POA: Diagnosis not present

## 2015-09-17 DIAGNOSIS — I10 Essential (primary) hypertension: Secondary | ICD-10-CM | POA: Diagnosis not present

## 2015-09-17 DIAGNOSIS — K219 Gastro-esophageal reflux disease without esophagitis: Secondary | ICD-10-CM | POA: Diagnosis not present

## 2015-09-17 DIAGNOSIS — E11319 Type 2 diabetes mellitus with unspecified diabetic retinopathy without macular edema: Secondary | ICD-10-CM | POA: Diagnosis not present

## 2015-10-21 DIAGNOSIS — I10 Essential (primary) hypertension: Secondary | ICD-10-CM | POA: Diagnosis not present

## 2015-10-21 DIAGNOSIS — Z6827 Body mass index (BMI) 27.0-27.9, adult: Secondary | ICD-10-CM | POA: Diagnosis not present

## 2015-10-21 DIAGNOSIS — N183 Chronic kidney disease, stage 3 (moderate): Secondary | ICD-10-CM | POA: Diagnosis not present

## 2015-10-21 DIAGNOSIS — E1129 Type 2 diabetes mellitus with other diabetic kidney complication: Secondary | ICD-10-CM | POA: Diagnosis not present

## 2015-12-29 DIAGNOSIS — N401 Enlarged prostate with lower urinary tract symptoms: Secondary | ICD-10-CM | POA: Diagnosis not present

## 2015-12-29 DIAGNOSIS — I129 Hypertensive chronic kidney disease with stage 1 through stage 4 chronic kidney disease, or unspecified chronic kidney disease: Secondary | ICD-10-CM | POA: Diagnosis not present

## 2015-12-29 DIAGNOSIS — M109 Gout, unspecified: Secondary | ICD-10-CM | POA: Diagnosis not present

## 2015-12-29 DIAGNOSIS — I10 Essential (primary) hypertension: Secondary | ICD-10-CM | POA: Diagnosis not present

## 2015-12-29 DIAGNOSIS — N2581 Secondary hyperparathyroidism of renal origin: Secondary | ICD-10-CM | POA: Diagnosis not present

## 2015-12-29 DIAGNOSIS — D509 Iron deficiency anemia, unspecified: Secondary | ICD-10-CM | POA: Diagnosis not present

## 2015-12-29 DIAGNOSIS — H9193 Unspecified hearing loss, bilateral: Secondary | ICD-10-CM | POA: Diagnosis not present

## 2015-12-29 DIAGNOSIS — N183 Chronic kidney disease, stage 3 (moderate): Secondary | ICD-10-CM | POA: Diagnosis not present

## 2015-12-29 DIAGNOSIS — Z6826 Body mass index (BMI) 26.0-26.9, adult: Secondary | ICD-10-CM | POA: Diagnosis not present

## 2015-12-29 DIAGNOSIS — E1129 Type 2 diabetes mellitus with other diabetic kidney complication: Secondary | ICD-10-CM | POA: Diagnosis not present

## 2015-12-29 DIAGNOSIS — E11319 Type 2 diabetes mellitus with unspecified diabetic retinopathy without macular edema: Secondary | ICD-10-CM | POA: Diagnosis not present

## 2015-12-29 DIAGNOSIS — D692 Other nonthrombocytopenic purpura: Secondary | ICD-10-CM | POA: Diagnosis not present

## 2015-12-29 DIAGNOSIS — Z1389 Encounter for screening for other disorder: Secondary | ICD-10-CM | POA: Diagnosis not present

## 2016-02-25 DIAGNOSIS — M109 Gout, unspecified: Secondary | ICD-10-CM | POA: Diagnosis not present

## 2016-02-25 DIAGNOSIS — E1129 Type 2 diabetes mellitus with other diabetic kidney complication: Secondary | ICD-10-CM | POA: Diagnosis not present

## 2016-02-25 DIAGNOSIS — Z125 Encounter for screening for malignant neoplasm of prostate: Secondary | ICD-10-CM | POA: Diagnosis not present

## 2016-03-03 DIAGNOSIS — D692 Other nonthrombocytopenic purpura: Secondary | ICD-10-CM | POA: Diagnosis not present

## 2016-03-03 DIAGNOSIS — M109 Gout, unspecified: Secondary | ICD-10-CM | POA: Diagnosis not present

## 2016-03-03 DIAGNOSIS — N320 Bladder-neck obstruction: Secondary | ICD-10-CM | POA: Diagnosis not present

## 2016-03-03 DIAGNOSIS — Z1389 Encounter for screening for other disorder: Secondary | ICD-10-CM | POA: Diagnosis not present

## 2016-03-03 DIAGNOSIS — N2581 Secondary hyperparathyroidism of renal origin: Secondary | ICD-10-CM | POA: Diagnosis not present

## 2016-03-03 DIAGNOSIS — D631 Anemia in chronic kidney disease: Secondary | ICD-10-CM | POA: Diagnosis not present

## 2016-03-03 DIAGNOSIS — R809 Proteinuria, unspecified: Secondary | ICD-10-CM | POA: Diagnosis not present

## 2016-03-03 DIAGNOSIS — Z0000 Encounter for general adult medical examination without abnormal findings: Secondary | ICD-10-CM | POA: Diagnosis not present

## 2016-03-03 DIAGNOSIS — Z794 Long term (current) use of insulin: Secondary | ICD-10-CM | POA: Diagnosis not present

## 2016-03-03 DIAGNOSIS — Z6826 Body mass index (BMI) 26.0-26.9, adult: Secondary | ICD-10-CM | POA: Diagnosis not present

## 2016-03-03 DIAGNOSIS — I129 Hypertensive chronic kidney disease with stage 1 through stage 4 chronic kidney disease, or unspecified chronic kidney disease: Secondary | ICD-10-CM | POA: Diagnosis not present

## 2016-03-23 DIAGNOSIS — N359 Urethral stricture, unspecified: Secondary | ICD-10-CM | POA: Diagnosis not present

## 2016-03-23 DIAGNOSIS — D3000 Benign neoplasm of unspecified kidney: Secondary | ICD-10-CM | POA: Diagnosis not present

## 2016-03-23 DIAGNOSIS — N401 Enlarged prostate with lower urinary tract symptoms: Secondary | ICD-10-CM | POA: Diagnosis not present

## 2016-03-23 DIAGNOSIS — R350 Frequency of micturition: Secondary | ICD-10-CM | POA: Diagnosis not present

## 2016-04-28 DIAGNOSIS — N183 Chronic kidney disease, stage 3 (moderate): Secondary | ICD-10-CM | POA: Diagnosis not present

## 2016-04-28 DIAGNOSIS — D509 Iron deficiency anemia, unspecified: Secondary | ICD-10-CM | POA: Diagnosis not present

## 2016-04-28 DIAGNOSIS — E7800 Pure hypercholesterolemia, unspecified: Secondary | ICD-10-CM | POA: Diagnosis not present

## 2016-04-28 DIAGNOSIS — M109 Gout, unspecified: Secondary | ICD-10-CM | POA: Diagnosis not present

## 2016-04-28 DIAGNOSIS — E119 Type 2 diabetes mellitus without complications: Secondary | ICD-10-CM | POA: Diagnosis not present

## 2016-04-28 DIAGNOSIS — N2581 Secondary hyperparathyroidism of renal origin: Secondary | ICD-10-CM | POA: Diagnosis not present

## 2016-04-28 DIAGNOSIS — I129 Hypertensive chronic kidney disease with stage 1 through stage 4 chronic kidney disease, or unspecified chronic kidney disease: Secondary | ICD-10-CM | POA: Diagnosis not present

## 2016-04-28 DIAGNOSIS — D631 Anemia in chronic kidney disease: Secondary | ICD-10-CM | POA: Diagnosis not present

## 2016-05-24 DIAGNOSIS — N183 Chronic kidney disease, stage 3 (moderate): Secondary | ICD-10-CM | POA: Diagnosis not present

## 2016-06-03 DIAGNOSIS — E1129 Type 2 diabetes mellitus with other diabetic kidney complication: Secondary | ICD-10-CM | POA: Diagnosis not present

## 2016-06-03 DIAGNOSIS — Z23 Encounter for immunization: Secondary | ICD-10-CM | POA: Diagnosis not present

## 2016-06-03 DIAGNOSIS — R413 Other amnesia: Secondary | ICD-10-CM | POA: Diagnosis not present

## 2016-06-03 DIAGNOSIS — N2581 Secondary hyperparathyroidism of renal origin: Secondary | ICD-10-CM | POA: Diagnosis not present

## 2016-06-03 DIAGNOSIS — E7800 Pure hypercholesterolemia, unspecified: Secondary | ICD-10-CM | POA: Diagnosis not present

## 2016-06-03 DIAGNOSIS — D509 Iron deficiency anemia, unspecified: Secondary | ICD-10-CM | POA: Diagnosis not present

## 2016-06-03 DIAGNOSIS — N184 Chronic kidney disease, stage 4 (severe): Secondary | ICD-10-CM | POA: Diagnosis not present

## 2016-06-03 DIAGNOSIS — Z6827 Body mass index (BMI) 27.0-27.9, adult: Secondary | ICD-10-CM | POA: Diagnosis not present

## 2016-06-03 DIAGNOSIS — E11319 Type 2 diabetes mellitus with unspecified diabetic retinopathy without macular edema: Secondary | ICD-10-CM | POA: Diagnosis not present

## 2016-06-03 DIAGNOSIS — I10 Essential (primary) hypertension: Secondary | ICD-10-CM | POA: Diagnosis not present

## 2016-06-03 DIAGNOSIS — N401 Enlarged prostate with lower urinary tract symptoms: Secondary | ICD-10-CM | POA: Diagnosis not present

## 2016-06-03 DIAGNOSIS — I129 Hypertensive chronic kidney disease with stage 1 through stage 4 chronic kidney disease, or unspecified chronic kidney disease: Secondary | ICD-10-CM | POA: Diagnosis not present

## 2016-06-03 DIAGNOSIS — H9193 Unspecified hearing loss, bilateral: Secondary | ICD-10-CM | POA: Diagnosis not present

## 2016-06-03 DIAGNOSIS — D692 Other nonthrombocytopenic purpura: Secondary | ICD-10-CM | POA: Diagnosis not present

## 2016-07-22 ENCOUNTER — Encounter: Payer: Self-pay | Admitting: Neurology

## 2016-07-22 ENCOUNTER — Ambulatory Visit (INDEPENDENT_AMBULATORY_CARE_PROVIDER_SITE_OTHER): Payer: Medicare Other | Admitting: Neurology

## 2016-07-22 VITALS — BP 130/64 | HR 73 | Ht 66.0 in | Wt 161.5 lb

## 2016-07-22 DIAGNOSIS — E784 Other hyperlipidemia: Secondary | ICD-10-CM

## 2016-07-22 DIAGNOSIS — I10 Essential (primary) hypertension: Secondary | ICD-10-CM

## 2016-07-22 DIAGNOSIS — E119 Type 2 diabetes mellitus without complications: Secondary | ICD-10-CM | POA: Diagnosis not present

## 2016-07-22 DIAGNOSIS — Z794 Long term (current) use of insulin: Secondary | ICD-10-CM

## 2016-07-22 DIAGNOSIS — G3184 Mild cognitive impairment, so stated: Secondary | ICD-10-CM | POA: Diagnosis not present

## 2016-07-22 DIAGNOSIS — E7849 Other hyperlipidemia: Secondary | ICD-10-CM

## 2016-07-22 NOTE — Progress Notes (Signed)
NEUROLOGY CONSULTATION NOTE  [name] MRN: [id number] DOB: [birth year]  Referring provider: Dr. Domenick Gong Primary care provider: Dr. Domenick Gong  Reason for consult:  Memory loss  Dear [name]:  Thank you for your kind referral of Samuel [name] for consultation of the above symptoms. Although his history is well known to you, please allow me to reiterate it for the purpose of our medical record. The patient was accompanied to the clinic by his wife who also provides collateral information. Records and images were personally reviewed where available.  HISTORY OF PRESENT ILLNESS: This is an 82 year old right-handed man with a history of diabetes, hyperlipidemia, hypertension, hyperparathyroidism, presenting for evaluation of worsening memory. His wife started noticing memory changes around 5 years ago and noticed it has been progressively worsening. He would ask what day it is, repeats himself, she has to remind him about his medications. He misplaces things frequently. He does recognive that he cannot remember names and "several other things, but I cannot remember what." His wife is in charge of bill payments. They have house help for several hours 5 days a week. No difficulties with ADLs. He denies getting lost driving. He notices occasional word-finding difficulties. He has called his son to fix the TV when it gets off channel. His wife has noticed he is more irritable and has occasional anger problems.   He denies any headaches, dizziness, diplopia, dysarthria, dysphagia, neck/back pain, focal numbness/tingling/weakness, bladder dysfunction, anosmia, tremors, no falls. He has constipation. His mother had dementia at 45. His father had a cerebral aneurysm. He denies any significant head injuries. He stopped alcohol in the 1970s.  Laboratory Data: 06/04/16 B12 1112, RPR non-reactive, TSH 1.68  PAST MEDICAL HISTORY: Past Medical History:  Diagnosis Date  . BPH  (benign prostatic hyperplasia)   . Bruise    LEFT BACK/SHOULDER AREA  . Chronic kidney disease (CKD), stage III (moderate) BASE CRE  1.89  -- 2.2   NEPHROLOGIST-- [name]--  LOV DEC 2013  W/ CHART  . Diabetes mellitus    PCP--  [name]  . History of kidney stones   . Hypercholesteremia   . Hyperparathyroidism, secondary ([place])   . Hypertension   . Iron deficiency anemia   . Left shoulder strain    PT FELL ON ICE 11-06-2012    PAST SURGICAL HISTORY: Past Surgical History:  Procedure Laterality Date  . BLEPHAROPLASTY Bilateral 04-02-2005  . CYSTO/ LEFT URETERAL STENT PLACEMENT  02-07-2003  . CYSTO/ RIGHT URETER BALLOON DILATION/ STONE EXTRACTION  02-20-2003  . GREEN LIGHT LASER TURP (TRANSURETHRAL RESECTION OF PROSTATE N/A 11/24/2012   Procedure: GREEN LIGHT LASER TURP (TRANSURETHRAL RESECTION OF PROSTATE;  Surgeon: [name], MD;  Location: [facility];  Service: Urology;  Laterality: N/A;  . LEFT URETEROSCOPIC STONE EXTRACTION  02-14-2003    MEDICATIONS: Current Outpatient Prescriptions on File Prior to Visit  Medication Sig Dispense Refill  . allopurinol (ZYLOPRIM) 100 MG tablet Take 100 mg by mouth every evening.    [name] amLODipine (NORVASC) 10 MG tablet Take 10 mg by mouth every evening.     [name] aspirin EC 81 MG tablet Take 81 mg by mouth every evening.     [name] atorvastatin (LIPITOR) 20 MG tablet Take 20 mg by mouth every evening.     . calcitRIOL (ROCALTROL) 0.25 MCG capsule Take 0.25 mcg by mouth every Monday, Wednesday, and Friday.     . diazepam (VALIUM) 5 MG tablet Take  1 tablet (5 mg total) by mouth 2 (two) times daily as needed (for muscle spasm). 10 tablet 0  . doxycycline (VIBRAMYCIN) 100 MG capsule Take 1 capsule (100 mg total) by mouth 2 (two) times daily. 10 capsule 0  . dutasteride (AVODART) 0.5 MG capsule Take 0.5 mg by mouth every morning.     . ferrous sulfate 325 (65 FE) MG tablet Take 325 mg by mouth every morning.     . fish  oil-omega-3 fatty acids 1000 MG capsule Take 1 g by mouth every evening.     . furosemide (LASIX) 20 MG tablet Take 20 mg by mouth every morning.     [name] glimepiride (AMARYL) 4 MG tablet Take 4 mg by mouth 2 (two) times daily.     . insulin glargine (LANTUS SOLOSTAR) 100 UNIT/ML injection Inject 8-16 Units into the skin at bedtime.    . metroNIDAZOLE (FLAGYL) 500 MG tablet Take 1 tablet (500 mg total) by mouth 3 (three) times daily. 15 tablet 0  . sitaGLIPtin (JANUVIA) 50 MG tablet Take 50 mg by mouth every morning.     . tamsulosin (FLOMAX) 0.4 MG CAPS Take 0.4 mg by mouth every morning.    [name] telmisartan-hydrochlorothiazide (MICARDIS HCT) 80-12.5 MG per tablet Take 1 tablet by mouth every morning.    . traMADol (ULTRAM) 50 MG tablet Take 50 mg by mouth every 6 (six) hours as needed (pain).    . vitamin B-12 (CYANOCOBALAMIN) 1000 MCG tablet Take 1,000 mcg by mouth every morning.      No current facility-administered medications on file prior to visit.     ALLERGIES: Allergies  Allergen Reactions  . Codeine Itching  . Penicillins Itching  . Sulfa Antibiotics     FAMILY HISTORY: No family history on file.  SOCIAL HISTORY: Social History   Social History  . Marital status: Married    Spouse name: N/A  . Number of children: N/A  . Years of education: N/A   Occupational History  . Not on file.   Social History Main Topics  . Smoking status: Former Smoker    Packs/day: 4.00    Years: 40.00    Types: Cigarettes    Quit date: 11/17/1992  . Smokeless tobacco: Never Used  . Alcohol use No  . Drug use: No  . Sexual activity: Not on file   Other Topics Concern  . Not on file   Social History Narrative  . No narrative on file    REVIEW OF SYSTEMS: Constitutional: No fevers, chills, or sweats, no generalized fatigue, change in appetite Eyes: No visual changes, double vision, eye pain Ear, nose and throat: No hearing loss, ear pain, nasal congestion, sore  throat Cardiovascular: No chest pain, palpitations Respiratory:  No shortness of breath at rest or with exertion, wheezes GastrointestinaI: No nausea, vomiting, diarrhea, abdominal pain, fecal incontinence Genitourinary:  No dysuria, urinary retention or frequency Musculoskeletal:  No neck pain, back pain Integumentary: No rash, pruritus, skin lesions Neurological: as above Psychiatric: No depression, insomnia, anxiety Endocrine: No palpitations, fatigue, diaphoresis, mood swings, change in appetite, change in weight, increased thirst Hematologic/Lymphatic:  No anemia, purpura, petechiae. Allergic/Immunologic: no itchy/runny eyes, nasal congestion, recent allergic reactions, rashes  PHYSICAL EXAM: Vitals:   07/22/16 1246  BP: 130/64  Pulse: 73   General: No acute distress Head:  Normocephalic/atraumatic Eyes: Fundoscopic exam shows bilateral sharp discs, no vessel changes, exudates, or hemorrhages Neck: supple, no paraspinal tenderness, full range of motion Back: No paraspinal tenderness  Heart: regular rate and rhythm Lungs: Clear to auscultation bilaterally. Vascular: No carotid bruits. Skin/Extremities: No rash, no edema Neurological Exam: Mental status: alert and oriented to person, place, and time, no dysarthria or aphasia, Fund of knowledge is appropriate.  Remote memory intact.  Attention and concentration are normal.    Able to name objects and repeat phrases.  MMSE - Mini Mental State Exam 07/22/2016  Orientation to time 4  Orientation to Place 5  Registration 3  Attention/ Calculation 4  Recall 0  Language- name 2 objects 2  Language- repeat 1  Language- follow 3 step command 3  Language- read & follow direction 1  Write a sentence 1  Copy design 1  Total score 25   Cranial nerves: CN I: not tested CN II: pupils equal, round and reactive to light, visual fields intact, fundi unremarkable. CN III, IV, VI:  full range of motion, no nystagmus, no ptosis CN V:  facial sensation intact CN VII: upper and lower face symmetric CN VIII: hearing intact to finger rub CN IX, X: gag intact, uvula midline CN XI: sternocleidomastoid and trapezius muscles intact CN XII: tongue midline Bulk & Tone: normal, no fasciculations. Motor: 5/5 throughout with no pronator drift. Sensation: intact to light touch, cold, pin, vibration and joint position sense.  No extinction to double simultaneous stimulation.  Romberg test negative Deep Tendon Reflexes: +1 throughout, no ankle clonus Plantar responses: downgoing bilaterally Cerebellar: no incoordination on finger to nose, heel to shin. No dysdiadochokinesia Gait: narrow-based and steady, able to tandem walk adequately. Tremor: none  IMPRESSION: This is an 82 year old right-handed man with vascular risk factors including hypertension, hyperlipidemia, diabetes, presenting with worsening memory. His neurological exam is non-focal, MMSE today 25/30, indicating mild cognitive impairment. Head CT without contrast will be ordered to assess for underlying structural abnormality. TSH and B12 normal. We discussed the option of starting cholinesterase inhibitors such as Aricept, side effects and expectations from the medication were discussed. He would like to hold off for now. We discussed the importance of control of vascular risk factors, physical exercise, and brain stimulation exercises for brain health. He will follow-up in 6 months.   Thank you for allowing me to participate in the care of this patient. Please do not hesitate to call for any questions or concerns.   Ellouise Newer, M.D.  CC: Dr. Osborne Casco

## 2016-07-22 NOTE — Patient Instructions (Addendum)
1. Schedule head CT without contrast 2. Bloodwork from Dr. Loren Racer office will be requested for review 3. Control of blood pressure, cholesterol, diabetes, as well as physical exercise and brain stimulation exercises are important for brain health 4. Follow-up in 6 months

## 2016-08-05 ENCOUNTER — Encounter: Payer: Self-pay | Admitting: Neurology

## 2016-08-05 DIAGNOSIS — E7849 Other hyperlipidemia: Secondary | ICD-10-CM | POA: Insufficient documentation

## 2016-08-05 DIAGNOSIS — I10 Essential (primary) hypertension: Secondary | ICD-10-CM | POA: Insufficient documentation

## 2016-08-05 DIAGNOSIS — Z794 Long term (current) use of insulin: Secondary | ICD-10-CM

## 2016-08-05 DIAGNOSIS — G3184 Mild cognitive impairment, so stated: Secondary | ICD-10-CM | POA: Insufficient documentation

## 2016-08-05 DIAGNOSIS — E119 Type 2 diabetes mellitus without complications: Secondary | ICD-10-CM | POA: Insufficient documentation

## 2016-08-13 ENCOUNTER — Ambulatory Visit: Payer: Medicare Other | Admitting: Neurology

## 2016-08-25 DIAGNOSIS — Z794 Long term (current) use of insulin: Secondary | ICD-10-CM | POA: Diagnosis not present

## 2016-08-25 DIAGNOSIS — H9193 Unspecified hearing loss, bilateral: Secondary | ICD-10-CM | POA: Diagnosis not present

## 2016-08-25 DIAGNOSIS — Z6827 Body mass index (BMI) 27.0-27.9, adult: Secondary | ICD-10-CM | POA: Diagnosis not present

## 2016-08-25 DIAGNOSIS — D692 Other nonthrombocytopenic purpura: Secondary | ICD-10-CM | POA: Diagnosis not present

## 2016-08-25 DIAGNOSIS — R413 Other amnesia: Secondary | ICD-10-CM | POA: Diagnosis not present

## 2016-08-25 DIAGNOSIS — I129 Hypertensive chronic kidney disease with stage 1 through stage 4 chronic kidney disease, or unspecified chronic kidney disease: Secondary | ICD-10-CM | POA: Diagnosis not present

## 2016-08-25 DIAGNOSIS — E11319 Type 2 diabetes mellitus with unspecified diabetic retinopathy without macular edema: Secondary | ICD-10-CM | POA: Diagnosis not present

## 2016-08-25 DIAGNOSIS — E1129 Type 2 diabetes mellitus with other diabetic kidney complication: Secondary | ICD-10-CM | POA: Diagnosis not present

## 2016-08-25 DIAGNOSIS — D631 Anemia in chronic kidney disease: Secondary | ICD-10-CM | POA: Diagnosis not present

## 2016-08-25 DIAGNOSIS — E7800 Pure hypercholesterolemia, unspecified: Secondary | ICD-10-CM | POA: Diagnosis not present

## 2016-08-25 DIAGNOSIS — R808 Other proteinuria: Secondary | ICD-10-CM | POA: Diagnosis not present

## 2016-08-25 DIAGNOSIS — N401 Enlarged prostate with lower urinary tract symptoms: Secondary | ICD-10-CM | POA: Diagnosis not present

## 2016-11-24 DIAGNOSIS — I10 Essential (primary) hypertension: Secondary | ICD-10-CM | POA: Diagnosis not present

## 2016-11-24 DIAGNOSIS — D631 Anemia in chronic kidney disease: Secondary | ICD-10-CM | POA: Diagnosis not present

## 2016-11-24 DIAGNOSIS — H9193 Unspecified hearing loss, bilateral: Secondary | ICD-10-CM | POA: Diagnosis not present

## 2016-11-24 DIAGNOSIS — Z6827 Body mass index (BMI) 27.0-27.9, adult: Secondary | ICD-10-CM | POA: Diagnosis not present

## 2016-11-24 DIAGNOSIS — N184 Chronic kidney disease, stage 4 (severe): Secondary | ICD-10-CM | POA: Diagnosis not present

## 2016-11-24 DIAGNOSIS — Z794 Long term (current) use of insulin: Secondary | ICD-10-CM | POA: Diagnosis not present

## 2016-11-24 DIAGNOSIS — I129 Hypertensive chronic kidney disease with stage 1 through stage 4 chronic kidney disease, or unspecified chronic kidney disease: Secondary | ICD-10-CM | POA: Diagnosis not present

## 2016-11-24 DIAGNOSIS — D692 Other nonthrombocytopenic purpura: Secondary | ICD-10-CM | POA: Diagnosis not present

## 2016-11-24 DIAGNOSIS — E1129 Type 2 diabetes mellitus with other diabetic kidney complication: Secondary | ICD-10-CM | POA: Diagnosis not present

## 2017-01-17 ENCOUNTER — Ambulatory Visit
Admission: RE | Admit: 2017-01-17 | Discharge: 2017-01-17 | Disposition: A | Discharge status: Home / Self Care | Payer: Medicare Other | Source: Ambulatory Visit | Attending: Neurology | Admitting: Neurology

## 2017-01-17 DIAGNOSIS — G3184 Mild cognitive impairment, so stated: Secondary | ICD-10-CM

## 2017-01-17 DIAGNOSIS — R413 Other amnesia: Secondary | ICD-10-CM | POA: Diagnosis not present

## 2017-01-18 ENCOUNTER — Telehealth: Payer: Self-pay

## 2017-01-18 NOTE — Telephone Encounter (Signed)
-----   Message from Cameron Sprang, MD sent at 01/18/2017  2:41 PM EDT ----- Pls let him/family know that head CT did not show any evidence of tumor, stroke, or bleed. It showed age-related changes and hardening of blood vessels seen with blood pressure and cholesterol issues, continue control of these conditions. Thanks

## 2017-01-18 NOTE — Telephone Encounter (Signed)
PT called back and said he did not understand your message and would like a call back/Dawn

## 2017-01-18 NOTE — Telephone Encounter (Signed)
Returned pt call.  No answer.  LMOM asking pt to contact our office for resent CT results.

## 2017-01-18 NOTE — Telephone Encounter (Signed)
LMOM asking pt to return my call for resent CT results

## 2017-01-19 ENCOUNTER — Ambulatory Visit (INDEPENDENT_AMBULATORY_CARE_PROVIDER_SITE_OTHER): Payer: Medicare Other | Admitting: Neurology

## 2017-01-19 ENCOUNTER — Encounter: Payer: Self-pay | Admitting: Neurology

## 2017-01-19 VITALS — BP 146/58 | HR 71 | Ht 66.0 in | Wt 160.0 lb

## 2017-01-19 DIAGNOSIS — F0390 Unspecified dementia without behavioral disturbance: Secondary | ICD-10-CM | POA: Diagnosis not present

## 2017-01-19 DIAGNOSIS — F03A0 Unspecified dementia, mild, without behavioral disturbance, psychotic disturbance, mood disturbance, and anxiety: Secondary | ICD-10-CM

## 2017-01-19 MED ORDER — DONEPEZIL HCL 10 MG PO TABS: ORAL_TABLET | ORAL | 11 refills | Status: DC

## 2017-01-19 NOTE — Progress Notes (Signed)
NEUROLOGY FOLLOW UP OFFICE NOTE  SHON INDELICATO [id number] 24-Apr-1930  HISTORY OF PRESENT ILLNESS: I had the pleasure of seeing Samuel [name] in follow-up in the neurology clinic on 01/19/2017.  The patient was last seen 6 months and is again accompanied by his wife, as well as daughter and grandson, who help supplement the history today.  Records and images were personally reviewed where available. I personally reviewed head CT without contrast which showed mild diffuse atrophy and chronic microvascular disease, extensive calcified atherosclerosis at the skull base. No acute changes. He feels his memory is "pretty good." His wife brings a list of symptoms she has noticed taken off the Alzheimer's website. He forgets recently learned information, important dates, repeats himself. 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 He has difficulty concentrating and takes longer to finish tasks. He has occasional word-finding difficulties. He misplaces things frequently, with difficulty keeping up with his keys and the mail. Sometimes he accuses others of stealing it. There is some paranoia. There has been some changes in judgement and decision-making. He has withdrawn from previous hobbies such as hunting. He easily gets irritated. He continues to drive and denies any difficulties, family has some concerns with reaction times. His wife has to remind him about his medications. Hearing is not good. He denies any headaches, dizziness, diplopia, dysarthria, dysphagia, neck/back pain, focal numbness/tingling/weakness, bladder dysfunction, anosmia, tremors, no falls. He has constipation.   HPI 07/13/2016: This is an 83 yo RH man with a history of diabetes, hyperlipidemia, hypertension, hyperparathyroidism, with worsening memory. His wife started noticing memory changes around 5 years ago and noticed it has been progressively  worsening. He would ask what day it is, repeats himself, she has to remind him about his medications. He misplaces things frequently. He does recognive that he cannot remember names and "several other things, but I cannot remember what." His wife is in charge of bill payments. They have house help for several hours 5 days a week. No difficulties with ADLs. He denies getting lost driving. He notices occasional word-finding difficulties. He has called his son to fix the TV when it gets off channel. His wife has noticed he is more irritable and has occasional anger problems.   His mother had dementia at 63. His father had a cerebral aneurysm. He denies any significant head injuries. He stopped alcohol in the 1970s.  PAST MEDICAL HISTORY: Past Medical History:  Diagnosis Date  . BPH (benign prostatic hyperplasia)   . Bruise    LEFT BACK/SHOULDER AREA  . Chronic kidney disease (CKD), stage III (moderate) BASE CRE  1.89  -- 2.2   NEPHROLOGIST-- [name]--  LOV DEC 2013  W/ CHART  . Diabetes mellitus    PCP--  [name]  . History of kidney stones   . Hypercholesteremia   . Hyperparathyroidism, secondary ([place])   . Hypertension   . Iron deficiency anemia   . Left shoulder strain    PT FELL ON ICE 11-06-2012    MEDICATIONS: Current Outpatient Prescriptions on File Prior to Visit  Medication Sig Dispense Refill  . allopurinol (ZYLOPRIM) 100 MG tablet Take 100 mg by mouth every evening.    [name] amLODipine (NORVASC) 10 MG tablet Take 10 mg by mouth every evening.     [name] aspirin EC 81 MG tablet Take 81 mg by mouth every evening.     [name] atorvastatin (LIPITOR)  20 MG tablet Take 20 mg by mouth every evening.     . calcitRIOL (ROCALTROL) 0.25 MCG capsule Take 0.25 mcg by mouth every Monday, Wednesday, and Friday.     . diazepam (VALIUM) 5 MG tablet Take 1 tablet (5 mg total) by mouth 2 (two) times daily as needed (for muscle spasm). 10 tablet 0  . furosemide (LASIX) 20 MG tablet Take 20 mg by mouth  every morning.     [name] glimepiride (AMARYL) 4 MG tablet Take 4 mg by mouth 2 (two) times daily.     . insulin glargine (LANTUS SOLOSTAR) 100 UNIT/ML injection Inject 8-16 Units into the skin at bedtime.    . sitaGLIPtin (JANUVIA) 50 MG tablet Take 50 mg by mouth every morning.     . tamsulosin (FLOMAX) 0.4 MG CAPS Take 0.4 mg by mouth every morning.    . vitamin B-12 (CYANOCOBALAMIN) 1000 MCG tablet Take 1,000 mcg by mouth every morning.      No current facility-administered medications on file prior to visit.     ALLERGIES: Allergies  Allergen Reactions  . Codeine Itching  . Penicillins Itching  . Sulfa Antibiotics     FAMILY HISTORY: No family history on file.  SOCIAL HISTORY: Social History   Social History  . Marital status: Married    Spouse name: N/A  . Number of children: N/A  . Years of education: N/A   Occupational History  . Not on file.   Social History Main Topics  . Smoking status: Former Smoker    Packs/day: 4.00    Years: 40.00    Types: Cigarettes    Quit date: 11/17/1992  . Smokeless tobacco: Never Used  . Alcohol use No  . Drug use: No  . Sexual activity: Not on file   Other Topics Concern  . Not on file   Social History Narrative   Lives with wife in a one story home.  Has 4 children.  Retired Psychologist, sport and exercise.      REVIEW OF SYSTEMS: Constitutional: No fevers, chills, or sweats, no generalized fatigue, change in appetite Eyes: No visual changes, double vision, eye pain Ear, nose and throat: No hearing loss, ear pain, nasal congestion, sore throat Cardiovascular: No chest pain, palpitations Respiratory:  No shortness of breath at rest or with exertion, wheezes GastrointestinaI: No nausea, vomiting, diarrhea, abdominal pain, fecal incontinence Genitourinary:  No dysuria, urinary retention or frequency Musculoskeletal:  No neck pain, back pain Integumentary: No rash, pruritus, skin lesions Neurological: as above Psychiatric: No depression, insomnia,  anxiety Endocrine: No palpitations, fatigue, diaphoresis, mood swings, change in appetite, change in weight, increased thirst Hematologic/Lymphatic:  No anemia, purpura, petechiae. Allergic/Immunologic: no itchy/runny eyes, nasal congestion, recent allergic reactions, rashes  PHYSICAL EXAM: Vitals:   01/19/17 1129  BP: (!) 146/58  Pulse: 71   General: No acute distress Head:  Normocephalic/atraumatic Neck: supple, no paraspinal tenderness, full range of motion Heart:  Regular rate and rhythm Lungs:  Clear to auscultation bilaterally Back: No paraspinal tenderness Skin/Extremities: No rash, no edema Neurological Exam: alert and oriented to person, place, and time. No aphasia or dysarthria. Fund of knowledge is appropriate.  Recent and remote memory are intact.  Attention and concentration are normal.    Able to name objects and repeat phrases. CDT 5/5 MMSE - Mini Mental State Exam 01/19/2017 07/22/2016  Orientation to time 3 4  Orientation to Place 4 5  Registration 3 3  Attention/ Calculation 2 4  Recall 0 0  Language-  name 2 objects 2 2  Language- repeat 1 1  Language- follow 3 step command 3 3  Language- read & follow direction 1 1  Write a sentence 1 1  Copy design 1 1  Total score 21 25    Cranial nerves: Pupils equal, round, reactive to light. No facial asymmetry. Tongue, uvula, palate midline.  Motor: moves all extremities symmetrically. Gait narrow-based and steady.  IMPRESSION: This is an 83 yo RH man with vascular risk factors including hypertension, hyperlipidemia, diabetes, with worsening memory. MMSE today 21/30, showing a decline from his last visit, consistent with mild dementia, likely Alzheimer's type. Head CT without contrast no acute changes. TSH and B12 normal. We had previously discussed starting cholinesterase inhibitors such as Aricept, side effects and expectations from the medication were again discussed, his family would like to proceed. Start Aricept 5mg   daily for 1 month, then increase to 10mg  daily. Continue to monitor driving, no driving in poor conditions or interstate driving. We again discussed the importance of control of vascular risk factors, physical exercise, and brain stimulation exercises for brain health. He will follow-up in 6 months and knows to call for any changes.  Thank you for allowing me to participate in his care.  Please do not hesitate to call for any questions or concerns.  The duration of this appointment visit was 25 minutes of face-to-face time with the patient.  Greater than 50% of this time was spent in counseling, explanation of diagnosis, planning of further management, and coordination of care.   Ellouise Newer, M.D.   CC:Dr. Tisovec

## 2017-01-19 NOTE — Patient Instructions (Signed)
1. Start Aricept 10mg : take 1/2 tablet daily for 1 month, then increase to 1 tablet daily 2. Control of blood pressure, cholesterol, diabetes, as well as physical exercise and brain stimulation exercises are important for brain health 3. Continue to monitor driving 4. Follow-up in 6 months, call for any changes

## 2017-01-20 NOTE — Telephone Encounter (Signed)
Pt was seen in office on 01/19/17.  All messages relayed at that time

## 2017-02-10 DIAGNOSIS — N183 Chronic kidney disease, stage 3 (moderate): Secondary | ICD-10-CM | POA: Diagnosis not present

## 2017-02-10 DIAGNOSIS — I129 Hypertensive chronic kidney disease with stage 1 through stage 4 chronic kidney disease, or unspecified chronic kidney disease: Secondary | ICD-10-CM | POA: Diagnosis not present

## 2017-02-10 DIAGNOSIS — D631 Anemia in chronic kidney disease: Secondary | ICD-10-CM | POA: Diagnosis not present

## 2017-02-10 DIAGNOSIS — D509 Iron deficiency anemia, unspecified: Secondary | ICD-10-CM | POA: Diagnosis not present

## 2017-02-10 DIAGNOSIS — N189 Chronic kidney disease, unspecified: Secondary | ICD-10-CM | POA: Diagnosis not present

## 2017-02-10 DIAGNOSIS — N2581 Secondary hyperparathyroidism of renal origin: Secondary | ICD-10-CM | POA: Diagnosis not present

## 2017-02-10 DIAGNOSIS — E7800 Pure hypercholesterolemia, unspecified: Secondary | ICD-10-CM | POA: Diagnosis not present

## 2017-02-10 DIAGNOSIS — M109 Gout, unspecified: Secondary | ICD-10-CM | POA: Diagnosis not present

## 2017-02-10 DIAGNOSIS — E119 Type 2 diabetes mellitus without complications: Secondary | ICD-10-CM | POA: Diagnosis not present

## 2017-02-16 ENCOUNTER — Other Ambulatory Visit: Payer: Self-pay | Admitting: Physician Assistant

## 2017-02-16 DIAGNOSIS — Z86010 Personal history of colonic polyps: Secondary | ICD-10-CM

## 2017-02-16 DIAGNOSIS — R1030 Lower abdominal pain, unspecified: Secondary | ICD-10-CM | POA: Diagnosis not present

## 2017-02-16 DIAGNOSIS — R194 Change in bowel habit: Secondary | ICD-10-CM | POA: Diagnosis not present

## 2017-02-16 DIAGNOSIS — K5900 Constipation, unspecified: Secondary | ICD-10-CM | POA: Diagnosis not present

## 2017-02-16 DIAGNOSIS — R1031 Right lower quadrant pain: Secondary | ICD-10-CM

## 2017-02-16 DIAGNOSIS — K5790 Diverticulosis of intestine, part unspecified, without perforation or abscess without bleeding: Secondary | ICD-10-CM

## 2017-02-16 DIAGNOSIS — R1032 Left lower quadrant pain: Secondary | ICD-10-CM

## 2017-02-16 DIAGNOSIS — D649 Anemia, unspecified: Secondary | ICD-10-CM | POA: Diagnosis not present

## 2017-02-16 DIAGNOSIS — Z8719 Personal history of other diseases of the digestive system: Secondary | ICD-10-CM | POA: Diagnosis not present

## 2017-02-21 ENCOUNTER — Ambulatory Visit
Admission: RE | Admit: 2017-02-21 | Discharge: 2017-02-21 | Disposition: A | Discharge status: Home / Self Care | Payer: Medicare Other | Source: Ambulatory Visit | Attending: Physician Assistant | Admitting: Physician Assistant

## 2017-02-21 DIAGNOSIS — Z86010 Personal history of colonic polyps: Secondary | ICD-10-CM

## 2017-02-21 DIAGNOSIS — K5730 Diverticulosis of large intestine without perforation or abscess without bleeding: Secondary | ICD-10-CM | POA: Diagnosis not present

## 2017-02-21 DIAGNOSIS — K5790 Diverticulosis of intestine, part unspecified, without perforation or abscess without bleeding: Secondary | ICD-10-CM

## 2017-02-21 DIAGNOSIS — R1032 Left lower quadrant pain: Secondary | ICD-10-CM

## 2017-02-21 DIAGNOSIS — R1031 Right lower quadrant pain: Secondary | ICD-10-CM

## 2017-03-01 DIAGNOSIS — E7800 Pure hypercholesterolemia, unspecified: Secondary | ICD-10-CM | POA: Diagnosis not present

## 2017-03-01 DIAGNOSIS — M109 Gout, unspecified: Secondary | ICD-10-CM | POA: Diagnosis not present

## 2017-03-01 DIAGNOSIS — Z125 Encounter for screening for malignant neoplasm of prostate: Secondary | ICD-10-CM | POA: Diagnosis not present

## 2017-03-01 DIAGNOSIS — E1129 Type 2 diabetes mellitus with other diabetic kidney complication: Secondary | ICD-10-CM | POA: Diagnosis not present

## 2017-03-01 DIAGNOSIS — N184 Chronic kidney disease, stage 4 (severe): Secondary | ICD-10-CM | POA: Diagnosis not present

## 2017-03-08 DIAGNOSIS — E1129 Type 2 diabetes mellitus with other diabetic kidney complication: Secondary | ICD-10-CM | POA: Diagnosis not present

## 2017-03-08 DIAGNOSIS — R808 Other proteinuria: Secondary | ICD-10-CM | POA: Diagnosis not present

## 2017-03-08 DIAGNOSIS — I10 Essential (primary) hypertension: Secondary | ICD-10-CM | POA: Diagnosis not present

## 2017-03-08 DIAGNOSIS — N2581 Secondary hyperparathyroidism of renal origin: Secondary | ICD-10-CM | POA: Diagnosis not present

## 2017-03-08 DIAGNOSIS — D508 Other iron deficiency anemias: Secondary | ICD-10-CM | POA: Diagnosis not present

## 2017-03-08 DIAGNOSIS — Z794 Long term (current) use of insulin: Secondary | ICD-10-CM | POA: Diagnosis not present

## 2017-03-08 DIAGNOSIS — R413 Other amnesia: Secondary | ICD-10-CM | POA: Diagnosis not present

## 2017-03-08 DIAGNOSIS — H9193 Unspecified hearing loss, bilateral: Secondary | ICD-10-CM | POA: Diagnosis not present

## 2017-03-08 DIAGNOSIS — Z1389 Encounter for screening for other disorder: Secondary | ICD-10-CM | POA: Diagnosis not present

## 2017-03-08 DIAGNOSIS — Z6826 Body mass index (BMI) 26.0-26.9, adult: Secondary | ICD-10-CM | POA: Diagnosis not present

## 2017-03-08 DIAGNOSIS — I129 Hypertensive chronic kidney disease with stage 1 through stage 4 chronic kidney disease, or unspecified chronic kidney disease: Secondary | ICD-10-CM | POA: Diagnosis not present

## 2017-03-08 DIAGNOSIS — Z0000 Encounter for general adult medical examination without abnormal findings: Secondary | ICD-10-CM | POA: Diagnosis not present

## 2017-03-15 DIAGNOSIS — K5900 Constipation, unspecified: Secondary | ICD-10-CM | POA: Diagnosis not present

## 2017-04-05 DIAGNOSIS — Z961 Presence of intraocular lens: Secondary | ICD-10-CM | POA: Diagnosis not present

## 2017-04-05 DIAGNOSIS — Z794 Long term (current) use of insulin: Secondary | ICD-10-CM | POA: Diagnosis not present

## 2017-04-05 DIAGNOSIS — H43811 Vitreous degeneration, right eye: Secondary | ICD-10-CM | POA: Diagnosis not present

## 2017-04-05 DIAGNOSIS — E119 Type 2 diabetes mellitus without complications: Secondary | ICD-10-CM | POA: Diagnosis not present

## 2017-04-06 ENCOUNTER — Telehealth: Payer: Self-pay | Admitting: Neurology

## 2017-04-06 NOTE — Telephone Encounter (Signed)
PT's wife wanted to know if the medication he is on comes in a patch, did not say what the medication was

## 2017-04-07 NOTE — Telephone Encounter (Signed)
Aricept  I've looked it up, and it appears that it is still in the research phase, at least from what I can see.

## 2017-04-07 NOTE — Telephone Encounter (Signed)
Aricept does not come in a patch, but a different medication, Exelon, which also works in similar way, comes in patch. Does she want to switch? Was he having side effects? Thanks

## 2017-06-07 DIAGNOSIS — Z6823 Body mass index (BMI) 23.0-23.9, adult: Secondary | ICD-10-CM | POA: Diagnosis not present

## 2017-06-07 DIAGNOSIS — H9193 Unspecified hearing loss, bilateral: Secondary | ICD-10-CM | POA: Diagnosis not present

## 2017-06-07 DIAGNOSIS — D692 Other nonthrombocytopenic purpura: Secondary | ICD-10-CM | POA: Diagnosis not present

## 2017-06-07 DIAGNOSIS — R413 Other amnesia: Secondary | ICD-10-CM | POA: Diagnosis not present

## 2017-06-07 DIAGNOSIS — M109 Gout, unspecified: Secondary | ICD-10-CM | POA: Diagnosis not present

## 2017-06-07 DIAGNOSIS — I129 Hypertensive chronic kidney disease with stage 1 through stage 4 chronic kidney disease, or unspecified chronic kidney disease: Secondary | ICD-10-CM | POA: Diagnosis not present

## 2017-06-07 DIAGNOSIS — Z794 Long term (current) use of insulin: Secondary | ICD-10-CM | POA: Diagnosis not present

## 2017-06-07 DIAGNOSIS — Z23 Encounter for immunization: Secondary | ICD-10-CM | POA: Diagnosis not present

## 2017-06-07 DIAGNOSIS — N2581 Secondary hyperparathyroidism of renal origin: Secondary | ICD-10-CM | POA: Diagnosis not present

## 2017-06-07 DIAGNOSIS — D631 Anemia in chronic kidney disease: Secondary | ICD-10-CM | POA: Diagnosis not present

## 2017-06-07 DIAGNOSIS — E1129 Type 2 diabetes mellitus with other diabetic kidney complication: Secondary | ICD-10-CM | POA: Diagnosis not present

## 2017-06-07 DIAGNOSIS — N184 Chronic kidney disease, stage 4 (severe): Secondary | ICD-10-CM | POA: Diagnosis not present

## 2017-07-20 ENCOUNTER — Ambulatory Visit: Payer: Medicare Other | Admitting: Neurology

## 2017-09-07 ENCOUNTER — Ambulatory Visit (INDEPENDENT_AMBULATORY_CARE_PROVIDER_SITE_OTHER): Payer: Medicare Other | Admitting: Neurology

## 2017-09-07 ENCOUNTER — Encounter: Payer: Self-pay | Admitting: Neurology

## 2017-09-07 VITALS — BP 130/48 | HR 73 | Ht 66.0 in | Wt 163.0 lb

## 2017-09-07 DIAGNOSIS — F0390 Unspecified dementia without behavioral disturbance: Secondary | ICD-10-CM | POA: Diagnosis not present

## 2017-09-07 DIAGNOSIS — F03A0 Unspecified dementia, mild, without behavioral disturbance, psychotic disturbance, mood disturbance, and anxiety: Secondary | ICD-10-CM

## 2017-09-07 MED ORDER — DONEPEZIL HCL 10 MG PO TABS: ORAL_TABLET | ORAL | 3 refills | Status: DC

## 2017-09-07 NOTE — Progress Notes (Signed)
NEUROLOGY FOLLOW UP OFFICE NOTE  Samuel [name] [id number] [birth year]  HISTORY OF PRESENT ILLNESS: I had the pleasure of seeing Samuel [name] in the neurology clinic on 09/07/2017.  The patient was last seen 8 months ago for mild dementia, and is again accompanied by his wife and grandson, who help supplement the history today. Head CT without contrast showed mild diffuse atrophy and chronic microvascular disease, extensive calcified atherosclerosis at the skull base. No acute changes. He is taking Aricept 10mg  daily without side effects. He feels his memory is good. He continues to drive and denies getting lost, then his wife reminds him that he got lost around a month ago going to [employer], he thought he was going to [employer]. He states he is usually good with taking his medications, his wife reports he forgets and she sets it on his plate. His wife is in charge of finances. She feels someone needs to be with him more. He is independent with dressing and bathing, but he might need encouragement to bathe. She notes he is more irritable, he gets in a bad mood more easily. His main concern is trouble sleeping, he takes occasional naps. He has difficulty initiating sleep, then wakes up multiple times to use the bathroom. He denies any headaches, dizziness, diplopia, dysarthria, dysphagia, neck/back pain, focal numbness/tingling/weakness, bladder dysfunction, anosmia, tremors, no falls.  HPI 07/13/2016: This is an 84 yo RH man with a history of diabetes, hyperlipidemia, hypertension, hyperparathyroidism, with worsening memory. His wife started noticing memory changes around 5 years ago and noticed it has been progressively worsening. He would ask what day it is, repeats himself, she has to remind him about his medications. He misplaces things frequently. He does recognive that he cannot remember names and "several other things, but I cannot remember what." His wife is in charge of bill payments.  They have house help for several hours 5 days a week. No difficulties with ADLs. He denies getting lost driving. He notices occasional word-finding difficulties. He has called his son to fix the TV when it gets off channel. His wife has noticed he is more irritable and has occasional anger problems.   His mother had dementia at 82. His father had a cerebral aneurysm. He denies any significant head injuries. He stopped alcohol in the 1970s.  PAST MEDICAL HISTORY: Past Medical History:  Diagnosis Date  . BPH (benign prostatic hyperplasia)   . Bruise    LEFT BACK/SHOULDER AREA  . Chronic kidney disease (CKD), stage III (moderate) BASE CRE  1.89  -- 2.2   NEPHROLOGIST-- DR Samuel [name]--  LOV DEC 2013  W/ CHART  . Diabetes mellitus    PCP--  DR Samuel [name]  . History of kidney stones   . Hypercholesteremia   . Hyperparathyroidism, secondary ([place])   . Hypertension   . Iron deficiency anemia   . Left shoulder strain    PT FELL ON ICE 11-06-2012    MEDICATIONS: Current Outpatient Medications on File Prior to Visit  Medication Sig Dispense Refill  . allopurinol (ZYLOPRIM) 100 MG tablet Take 100 mg by mouth every evening.    [name] amLODipine (NORVASC) 10 MG tablet Take 10 mg by mouth every evening.     [name] aspirin EC 81 MG tablet Take 81 mg by mouth every evening.     [name] atorvastatin (LIPITOR) 20 MG tablet Take 20 mg by mouth every evening.     . calcitRIOL (ROCALTROL) 0.25 MCG capsule Take 0.25 mcg by  mouth every Monday, Wednesday, and Friday.     . diazepam (VALIUM) 5 MG tablet Take 1 tablet (5 mg total) by mouth 2 (two) times daily as needed (for muscle spasm). 10 tablet 0  . donepezil (ARICEPT) 10 MG tablet Take 1/2 tablet daily for 1 month, then increase to 1 tablet daily 30 tablet 11  . furosemide (LASIX) 20 MG tablet Take 20 mg by mouth every morning.     [name] glimepiride (AMARYL) 4 MG tablet Take 4 mg by mouth 2 (two) times daily.     . insulin glargine (LANTUS SOLOSTAR) 100 UNIT/ML injection  Inject 8-16 Units into the skin at bedtime.    . sitaGLIPtin (JANUVIA) 50 MG tablet Take 50 mg by mouth every morning.     . tamsulosin (FLOMAX) 0.4 MG CAPS Take 0.4 mg by mouth every morning.    . vitamin B-12 (CYANOCOBALAMIN) 1000 MCG tablet Take 1,000 mcg by mouth every morning.      No current facility-administered medications on file prior to visit.     ALLERGIES: Allergies  Allergen Reactions  . Codeine Itching  . Penicillins Itching  . Sulfa Antibiotics     FAMILY HISTORY: No family history on file.  SOCIAL HISTORY: Social History   Socioeconomic History  . Marital status: Married    Spouse name: Not on file  . Number of children: Not on file  . Years of education: Not on file  . Highest education level: Not on file  Social Needs  . Financial resource strain: Not on file  . Food insecurity - worry: Not on file  . Food insecurity - inability: Not on file  . Transportation needs - medical: Not on file  . Transportation needs - non-medical: Not on file  Occupational History  . Not on file  Tobacco Use  . Smoking status: Former Smoker    Packs/day: 4.00    Years: 40.00    Pack years: 160.00    Types: Cigarettes    Last attempt to quit: 11/17/1992    Years since quitting: 24.8  . Smokeless tobacco: Never Used  Substance and Sexual Activity  . Alcohol use: No  . Drug use: No  . Sexual activity: Not on file  Other Topics Concern  . Not on file  Social History Narrative   Lives with wife in a one story home.  Has 4 children.  Retired Psychologist, sport and exercise.      REVIEW OF SYSTEMS: Constitutional: No fevers, chills, or sweats, no generalized fatigue, change in appetite Eyes: No visual changes, double vision, eye pain Ear, nose and throat: No hearing loss, ear pain, nasal congestion, sore throat Cardiovascular: No chest pain, palpitations Respiratory:  No shortness of breath at rest or with exertion, wheezes GastrointestinaI: No nausea, vomiting, diarrhea, abdominal pain,  fecal incontinence Genitourinary:  No dysuria, urinary retention or frequency Musculoskeletal:  No neck pain, back pain Integumentary: No rash, pruritus, skin lesions Neurological: as above Psychiatric: No depression, insomnia, anxiety Endocrine: No palpitations, fatigue, diaphoresis, mood swings, change in appetite, change in weight, increased thirst Hematologic/Lymphatic:  No anemia, purpura, petechiae. Allergic/Immunologic: no itchy/runny eyes, nasal congestion, recent allergic reactions, rashes  PHYSICAL EXAM: Vitals:   09/07/17 1137  BP: (!) 130/48  Pulse: 73  SpO2: 95%   General: No acute distress Head:  Normocephalic/atraumatic Neck: supple, no paraspinal tenderness, full range of motion Heart:  Regular rate and rhythm Lungs:  Clear to auscultation bilaterally Back: No paraspinal tenderness Skin/Extremities: No rash, no edema Neurological Exam:  alert and oriented to person, place, and time. No aphasia or dysarthria. Fund of knowledge is appropriate.  Remote memory intact.  Attention and concentration are normal.    Able to name objects and repeat phrases. CDT 5/5 MMSE - Mini Mental State Exam 09/07/2017 01/19/2017 07/22/2016  Orientation to time 4 3 4   Orientation to Place 4 4 5   Registration 3 3 3   Attention/ Calculation 5 2 4   Recall 0 0 0  Language- name 2 objects 2 2 2   Language- repeat 1 1 1   Language- follow 3 step command 3 3 3   Language- read & follow direction 1 1 1   Write a sentence 1 1 1   Copy design 0 1 1  Total score 24 21 25     Cranial nerves: Pupils equal, round, reactive to light. No facial asymmetry. Motor: no cogwheeling, 5/5 throughout with no pronator drift. No incoordination on finger to nose testing. Gait narrow-based and steady, no ataxia.  IMPRESSION: This is an 84 yo RH man with vascular risk factors including hypertension, hyperlipidemia, diabetes, with mild dementia. MMSE today 24/30 (21/30 in May 2018, 25/30 in November 2017). Symptoms  suggestive of mild dementia, likely Alzheimer's type. Head CT without contrast no acute changes. TSH and B12 normal. He is taking Aricept 10mg  daily without side effects, refills were sent. We discussed mood issues that may be seen with his condition, continue to monitor. They will discuss sleep issues with his PCP. We again discussed the importance of control of vascular risk factors, physical exercise, and brain stimulation exercises for brain health. He will [name] in 6 months and knows to call for any changes.  Thank you for allowing me to participate in his care.  Please do not hesitate to call for any questions or concerns.  The duration of this appointment visit was 25 minutes of face-to-face time with the patient.  Greater than 50% of this time was spent in counseling, explanation of diagnosis, planning of further management, and coordination of care.   Ellouise Newer, M.D.   CC:Dr. Tisovec

## 2017-09-07 NOTE — Patient Instructions (Signed)
1. Continue Aricept 10mg  daily 2. Follow-up in 6 months, call for any changes  FALL PRECAUTIONS: Be cautious when walking. Scan the area for obstacles that may increase the risk of trips and falls. When getting up in the mornings, sit up at the edge of the bed for a few minutes before getting out of bed. Consider elevating the bed at the head end to avoid drop of blood pressure when getting up. Walk always in a well-lit room (use night lights in the walls). Avoid area rugs or power cords from appliances in the middle of the walkways. Use a walker or a cane if necessary and consider physical therapy for balance exercise. Get your eyesight checked regularly.  FINANCIAL OVERSIGHT: Supervision, especially oversight when making financial decisions or transactions is also recommended.  HOME SAFETY: Consider the safety of the kitchen when operating appliances like stoves, microwave oven, and blender. Consider having supervision and share cooking responsibilities until no longer able to participate in those. Accidents with firearms and other hazards in the house should be identified and addressed as well.  DRIVING: Regarding driving, in patients with progressive memory problems, driving will be impaired. We advise to have someone else do the driving if trouble finding directions or if minor accidents are reported. Independent driving assessment is available to determine safety of driving.  ABILITY TO BE LEFT ALONE: If patient is unable to contact 911 operator, consider using LifeLine, or when the need is there, arrange for someone to stay with patients. Smoking is a fire hazard, consider supervision or cessation. Risk of wandering should be assessed by caregiver and if detected at any point, supervision and safe proof recommendations should be instituted.  MEDICATION SUPERVISION: Inability to self-administer medication needs to be constantly addressed. Implement a mechanism to ensure safe administration of the  medications.  RECOMMENDATIONS FOR ALL PATIENTS WITH MEMORY PROBLEMS: 1. Continue to exercise (Recommend 30 minutes of walking everyday, or 3 hours every week) 2. Increase social interactions - continue going to [place] and enjoy social gatherings with friends and family 3. Eat healthy, avoid fried foods and eat more fruits and vegetables 4. Maintain adequate blood pressure, blood sugar, and blood cholesterol level. Reducing the risk of stroke and cardiovascular disease also helps promoting better memory. 5. Avoid stressful situations. Live a simple life and avoid aggravations. Organize your time and prepare for the next day in anticipation. 6. Sleep well, avoid any interruptions of sleep and avoid any distractions in the bedroom that may interfere with adequate sleep quality 7. Avoid sugar, avoid sweets as there is a strong link between excessive sugar intake, diabetes, and cognitive impairment We discussed the Mediterranean diet, which has been shown to help patients reduce the risk of progressive memory disorders and reduces cardiovascular risk. This includes eating fish, eat fruits and green leafy vegetables, nuts like almonds and hazelnuts, walnuts, and also use olive oil. Avoid fast foods and fried foods as much as possible. Avoid sweets and sugar as sugar use has been linked to worsening of memory function.  There is always a concern of gradual progression of memory problems. If this is the case, then we may need to adjust level of care according to patient needs. Support, both to the patient and caregiver, should then be put into place.

## 2017-09-13 ENCOUNTER — Encounter: Payer: Self-pay | Admitting: Neurology

## 2017-09-14 DIAGNOSIS — N2581 Secondary hyperparathyroidism of renal origin: Secondary | ICD-10-CM | POA: Diagnosis not present

## 2017-09-14 DIAGNOSIS — R413 Other amnesia: Secondary | ICD-10-CM | POA: Diagnosis not present

## 2017-09-14 DIAGNOSIS — Z794 Long term (current) use of insulin: Secondary | ICD-10-CM | POA: Diagnosis not present

## 2017-09-14 DIAGNOSIS — N320 Bladder-neck obstruction: Secondary | ICD-10-CM | POA: Diagnosis not present

## 2017-09-14 DIAGNOSIS — R808 Other proteinuria: Secondary | ICD-10-CM | POA: Diagnosis not present

## 2017-09-14 DIAGNOSIS — Z6828 Body mass index (BMI) 28.0-28.9, adult: Secondary | ICD-10-CM | POA: Diagnosis not present

## 2017-09-14 DIAGNOSIS — E11319 Type 2 diabetes mellitus with unspecified diabetic retinopathy without macular edema: Secondary | ICD-10-CM | POA: Diagnosis not present

## 2017-09-14 DIAGNOSIS — H9193 Unspecified hearing loss, bilateral: Secondary | ICD-10-CM | POA: Diagnosis not present

## 2017-09-14 DIAGNOSIS — D508 Other iron deficiency anemias: Secondary | ICD-10-CM | POA: Diagnosis not present

## 2017-09-14 DIAGNOSIS — E1129 Type 2 diabetes mellitus with other diabetic kidney complication: Secondary | ICD-10-CM | POA: Diagnosis not present

## 2017-09-14 DIAGNOSIS — I129 Hypertensive chronic kidney disease with stage 1 through stage 4 chronic kidney disease, or unspecified chronic kidney disease: Secondary | ICD-10-CM | POA: Diagnosis not present

## 2017-09-14 DIAGNOSIS — D692 Other nonthrombocytopenic purpura: Secondary | ICD-10-CM | POA: Diagnosis not present

## 2017-11-02 DIAGNOSIS — Z6827 Body mass index (BMI) 27.0-27.9, adult: Secondary | ICD-10-CM | POA: Diagnosis not present

## 2017-11-02 DIAGNOSIS — J340 Abscess, furuncle and carbuncle of nose: Secondary | ICD-10-CM | POA: Diagnosis not present

## 2017-11-02 DIAGNOSIS — L570 Actinic keratosis: Secondary | ICD-10-CM | POA: Diagnosis not present

## 2017-11-02 DIAGNOSIS — Z789 Other specified health status: Secondary | ICD-10-CM | POA: Diagnosis not present

## 2017-11-21 DIAGNOSIS — L578 Other skin changes due to chronic exposure to nonionizing radiation: Secondary | ICD-10-CM | POA: Diagnosis not present

## 2017-11-21 DIAGNOSIS — L0109 Other impetigo: Secondary | ICD-10-CM | POA: Diagnosis not present

## 2017-11-21 DIAGNOSIS — D1801 Hemangioma of skin and subcutaneous tissue: Secondary | ICD-10-CM | POA: Diagnosis not present

## 2017-11-21 DIAGNOSIS — L821 Other seborrheic keratosis: Secondary | ICD-10-CM | POA: Diagnosis not present

## 2017-11-22 DIAGNOSIS — D631 Anemia in chronic kidney disease: Secondary | ICD-10-CM | POA: Diagnosis not present

## 2017-11-22 DIAGNOSIS — N2581 Secondary hyperparathyroidism of renal origin: Secondary | ICD-10-CM | POA: Diagnosis not present

## 2017-11-22 DIAGNOSIS — N183 Chronic kidney disease, stage 3 (moderate): Secondary | ICD-10-CM | POA: Diagnosis not present

## 2017-11-22 DIAGNOSIS — E1122 Type 2 diabetes mellitus with diabetic chronic kidney disease: Secondary | ICD-10-CM | POA: Diagnosis not present

## 2017-11-22 DIAGNOSIS — N401 Enlarged prostate with lower urinary tract symptoms: Secondary | ICD-10-CM | POA: Diagnosis not present

## 2017-11-22 DIAGNOSIS — N138 Other obstructive and reflux uropathy: Secondary | ICD-10-CM | POA: Diagnosis not present

## 2017-11-22 DIAGNOSIS — I129 Hypertensive chronic kidney disease with stage 1 through stage 4 chronic kidney disease, or unspecified chronic kidney disease: Secondary | ICD-10-CM | POA: Diagnosis not present

## 2017-11-22 DIAGNOSIS — F0390 Unspecified dementia without behavioral disturbance: Secondary | ICD-10-CM | POA: Diagnosis not present

## 2017-11-22 DIAGNOSIS — M109 Gout, unspecified: Secondary | ICD-10-CM | POA: Diagnosis not present

## 2017-12-08 DIAGNOSIS — N184 Chronic kidney disease, stage 4 (severe): Secondary | ICD-10-CM | POA: Diagnosis not present

## 2017-12-08 DIAGNOSIS — H9193 Unspecified hearing loss, bilateral: Secondary | ICD-10-CM | POA: Diagnosis not present

## 2017-12-08 DIAGNOSIS — E1129 Type 2 diabetes mellitus with other diabetic kidney complication: Secondary | ICD-10-CM | POA: Diagnosis not present

## 2017-12-08 DIAGNOSIS — E7800 Pure hypercholesterolemia, unspecified: Secondary | ICD-10-CM | POA: Diagnosis not present

## 2017-12-08 DIAGNOSIS — R413 Other amnesia: Secondary | ICD-10-CM | POA: Diagnosis not present

## 2017-12-08 DIAGNOSIS — Z6827 Body mass index (BMI) 27.0-27.9, adult: Secondary | ICD-10-CM | POA: Diagnosis not present

## 2017-12-08 DIAGNOSIS — I10 Essential (primary) hypertension: Secondary | ICD-10-CM | POA: Diagnosis not present

## 2017-12-08 DIAGNOSIS — N2581 Secondary hyperparathyroidism of renal origin: Secondary | ICD-10-CM | POA: Diagnosis not present

## 2017-12-08 DIAGNOSIS — Z794 Long term (current) use of insulin: Secondary | ICD-10-CM | POA: Diagnosis not present

## 2017-12-08 DIAGNOSIS — E11319 Type 2 diabetes mellitus with unspecified diabetic retinopathy without macular edema: Secondary | ICD-10-CM | POA: Diagnosis not present

## 2017-12-08 DIAGNOSIS — I129 Hypertensive chronic kidney disease with stage 1 through stage 4 chronic kidney disease, or unspecified chronic kidney disease: Secondary | ICD-10-CM | POA: Diagnosis not present

## 2017-12-08 DIAGNOSIS — D509 Iron deficiency anemia, unspecified: Secondary | ICD-10-CM | POA: Diagnosis not present

## 2017-12-27 DIAGNOSIS — M9903 Segmental and somatic dysfunction of lumbar region: Secondary | ICD-10-CM | POA: Diagnosis not present

## 2017-12-27 DIAGNOSIS — M5442 Lumbago with sciatica, left side: Secondary | ICD-10-CM | POA: Diagnosis not present

## 2017-12-28 DIAGNOSIS — M5442 Lumbago with sciatica, left side: Secondary | ICD-10-CM | POA: Diagnosis not present

## 2017-12-28 DIAGNOSIS — M9903 Segmental and somatic dysfunction of lumbar region: Secondary | ICD-10-CM | POA: Diagnosis not present

## 2017-12-29 DIAGNOSIS — M9903 Segmental and somatic dysfunction of lumbar region: Secondary | ICD-10-CM | POA: Diagnosis not present

## 2017-12-29 DIAGNOSIS — M5442 Lumbago with sciatica, left side: Secondary | ICD-10-CM | POA: Diagnosis not present

## 2018-01-02 DIAGNOSIS — M9903 Segmental and somatic dysfunction of lumbar region: Secondary | ICD-10-CM | POA: Diagnosis not present

## 2018-01-02 DIAGNOSIS — M5442 Lumbago with sciatica, left side: Secondary | ICD-10-CM | POA: Diagnosis not present

## 2018-01-03 DIAGNOSIS — M5442 Lumbago with sciatica, left side: Secondary | ICD-10-CM | POA: Diagnosis not present

## 2018-01-03 DIAGNOSIS — M9903 Segmental and somatic dysfunction of lumbar region: Secondary | ICD-10-CM | POA: Diagnosis not present

## 2018-01-04 DIAGNOSIS — M9903 Segmental and somatic dysfunction of lumbar region: Secondary | ICD-10-CM | POA: Diagnosis not present

## 2018-01-04 DIAGNOSIS — M5442 Lumbago with sciatica, left side: Secondary | ICD-10-CM | POA: Diagnosis not present

## 2018-01-16 DIAGNOSIS — M9903 Segmental and somatic dysfunction of lumbar region: Secondary | ICD-10-CM | POA: Diagnosis not present

## 2018-01-16 DIAGNOSIS — M5442 Lumbago with sciatica, left side: Secondary | ICD-10-CM | POA: Diagnosis not present

## 2018-01-17 DIAGNOSIS — M5442 Lumbago with sciatica, left side: Secondary | ICD-10-CM | POA: Diagnosis not present

## 2018-01-17 DIAGNOSIS — M9903 Segmental and somatic dysfunction of lumbar region: Secondary | ICD-10-CM | POA: Diagnosis not present

## 2018-01-18 DIAGNOSIS — M9903 Segmental and somatic dysfunction of lumbar region: Secondary | ICD-10-CM | POA: Diagnosis not present

## 2018-01-18 DIAGNOSIS — M5442 Lumbago with sciatica, left side: Secondary | ICD-10-CM | POA: Diagnosis not present

## 2018-01-19 DIAGNOSIS — M5442 Lumbago with sciatica, left side: Secondary | ICD-10-CM | POA: Diagnosis not present

## 2018-01-19 DIAGNOSIS — M9903 Segmental and somatic dysfunction of lumbar region: Secondary | ICD-10-CM | POA: Diagnosis not present

## 2018-01-23 DIAGNOSIS — M5442 Lumbago with sciatica, left side: Secondary | ICD-10-CM | POA: Diagnosis not present

## 2018-01-23 DIAGNOSIS — M9903 Segmental and somatic dysfunction of lumbar region: Secondary | ICD-10-CM | POA: Diagnosis not present

## 2018-01-25 DIAGNOSIS — M9903 Segmental and somatic dysfunction of lumbar region: Secondary | ICD-10-CM | POA: Diagnosis not present

## 2018-01-25 DIAGNOSIS — M5442 Lumbago with sciatica, left side: Secondary | ICD-10-CM | POA: Diagnosis not present

## 2018-01-31 DIAGNOSIS — M9903 Segmental and somatic dysfunction of lumbar region: Secondary | ICD-10-CM | POA: Diagnosis not present

## 2018-01-31 DIAGNOSIS — M5442 Lumbago with sciatica, left side: Secondary | ICD-10-CM | POA: Diagnosis not present

## 2018-03-07 ENCOUNTER — Other Ambulatory Visit: Payer: Self-pay

## 2018-03-07 ENCOUNTER — Ambulatory Visit (INDEPENDENT_AMBULATORY_CARE_PROVIDER_SITE_OTHER): Payer: Medicare Other | Admitting: Neurology

## 2018-03-07 ENCOUNTER — Encounter: Payer: Self-pay | Admitting: Neurology

## 2018-03-07 VITALS — BP 136/64 | HR 66 | Ht 67.0 in | Wt 170.0 lb

## 2018-03-07 DIAGNOSIS — F03A0 Unspecified dementia, mild, without behavioral disturbance, psychotic disturbance, mood disturbance, and anxiety: Secondary | ICD-10-CM

## 2018-03-07 DIAGNOSIS — R252 Cramp and spasm: Secondary | ICD-10-CM

## 2018-03-07 DIAGNOSIS — F0390 Unspecified dementia without behavioral disturbance: Secondary | ICD-10-CM

## 2018-03-07 MED ORDER — DONEPEZIL HCL 10 MG PO TABS: ORAL_TABLET | ORAL | 3 refills | Status: DC

## 2018-03-07 MED ORDER — GABAPENTIN 100 MG PO CAPS: 100.0000 mg | ORAL_CAPSULE | Freq: Every day | ORAL | 11 refills | Status: DC

## 2018-03-07 NOTE — Patient Instructions (Signed)
1. Start gabapentin 100mg  at bedtime 2. Continue Aricept 10mg  daily 3. Follow-up in 6 months, call for any changes  FALL PRECAUTIONS: Be cautious when walking. Scan the area for obstacles that may increase the risk of trips and falls. When getting up in the mornings, sit up at the edge of the bed for a few minutes before getting out of bed. Consider elevating the bed at the head end to avoid drop of blood pressure when getting up. Walk always in a well-lit room (use night lights in the walls). Avoid area rugs or power cords from appliances in the middle of the walkways. Use a walker or a cane if necessary and consider physical therapy for balance exercise. Get your eyesight checked regularly.  FINANCIAL OVERSIGHT: Supervision, especially oversight when making financial decisions or transactions is also recommended.  HOME SAFETY: Consider the safety of the kitchen when operating appliances like stoves, microwave oven, and blender. Consider having supervision and share cooking responsibilities until no longer able to participate in those. Accidents with firearms and other hazards in the house should be identified and addressed as well.  DRIVING: Regarding driving, in patients with progressive memory problems, driving will be impaired. We advise to have someone else do the driving if trouble finding directions or if minor accidents are reported. Independent driving assessment is available to determine safety of driving.  ABILITY TO BE LEFT ALONE: If patient is unable to contact 911 operator, consider using LifeLine, or when the need is there, arrange for someone to stay with patients. Smoking is a fire hazard, consider supervision or cessation. Risk of wandering should be assessed by caregiver and if detected at any point, supervision and safe proof recommendations should be instituted.  MEDICATION SUPERVISION: Inability to self-administer medication needs to be constantly addressed. Implement a mechanism  to ensure safe administration of the medications.  RECOMMENDATIONS FOR ALL PATIENTS WITH MEMORY PROBLEMS: 1. Continue to exercise (Recommend 30 minutes of walking everyday, or 3 hours every week) 2. Increase social interactions - continue going to [place] and enjoy social gatherings with friends and family 3. Eat healthy, avoid fried foods and eat more fruits and vegetables 4. Maintain adequate blood pressure, blood sugar, and blood cholesterol level. Reducing the risk of stroke and cardiovascular disease also helps promoting better memory. 5. Avoid stressful situations. Live a simple life and avoid aggravations. Organize your time and prepare for the next day in anticipation. 6. Sleep well, avoid any interruptions of sleep and avoid any distractions in the bedroom that may interfere with adequate sleep quality 7. Avoid sugar, avoid sweets as there is a strong link between excessive sugar intake, diabetes, and cognitive impairment The Mediterranean diet has been shown to help patients reduce the risk of progressive memory disorders and reduces cardiovascular risk. This includes eating fish, eat fruits and green leafy vegetables, nuts like almonds and hazelnuts, walnuts, and also use olive oil. Avoid fast foods and fried foods as much as possible. Avoid sweets and sugar as sugar use has been linked to worsening of memory function.  There is always a concern of gradual progression of memory problems. If this is the case, then we may need to adjust level of care according to patient needs. Support, both to the patient and caregiver, should then be put into place.

## 2018-03-07 NOTE — Progress Notes (Signed)
NEUROLOGY FOLLOW UP OFFICE NOTE  Samuel [name] [id number] 1930/02/06  HISTORY OF PRESENT ILLNESS: I had the pleasure of seeing Samuel [name] in follow-up in the neurology clinic on 09/07/2017.  The patient was last seen 8 months ago for mild dementia, and is again accompanied by his wife who helps supplement the history today. Head CT without contrast showed mild diffuse atrophy and chronic microvascular disease, extensive calcified atherosclerosis at the skull base. No acute changes. He continues on Aricept 10mg  daily without side effects. He and his wife report he is doing well, no significant changes since his last visit. He is worried about muscle cramps at night. His wife now manages medications and finances. He continues to drive without getting lost, his wife is always with him. Mood is stable, no paranoia or hallucinations. He is independent with dressing and bathing. He denies any headaches, dizziness, diplopia, dysarthria, dysphagia, neck/back pain, focal numbness/tingling/weakness, bladder dysfunction, anosmia, tremors, no falls. He has some sleep difficulties.   HPI 07/13/2016: This is an 84 yo RH man with a history of diabetes, hyperlipidemia, hypertension, hyperparathyroidism, with worsening memory. His wife started noticing memory changes around 5 years ago and noticed it has been progressively worsening. He would ask what day it is, repeats himself, she has to remind him about his medications. He misplaces things frequently. He does recognive that he cannot remember names and "several other things, but I cannot remember what." His wife is in charge of bill payments. They have house help for several hours 5 days a week. No difficulties with ADLs. He denies getting lost driving. He notices occasional word-finding difficulties. He has called his son to fix the TV when it gets off channel. His wife has noticed he is more irritable and has occasional anger problems.   His mother had  dementia at 29. His father had a cerebral aneurysm. He denies any significant head injuries. He stopped alcohol in the 1970s.  PAST MEDICAL HISTORY: Past Medical History:  Diagnosis Date  . BPH (benign prostatic hyperplasia)   . Bruise    LEFT BACK/SHOULDER AREA  . Chronic kidney disease (CKD), stage III (moderate) (HCC) BASE CRE  1.89  -- 2.2   NEPHROLOGIST-- [name]--  LOV DEC 2013  W/ CHART  . Diabetes mellitus    PCP--  [name]  . History of kidney stones   . Hypercholesteremia   . Hyperparathyroidism, secondary ([place])   . Hypertension   . Iron deficiency anemia   . Left shoulder strain    PT FELL ON ICE 11-06-2012    MEDICATIONS: Current Outpatient Medications on File Prior to Visit  Medication Sig Dispense Refill  . allopurinol (ZYLOPRIM) 100 MG tablet Take 100 mg by mouth every evening.    [name] amLODipine (NORVASC) 10 MG tablet Take 10 mg by mouth every evening.     [name] aspirin EC 81 MG tablet Take 81 mg by mouth every evening.     [name] atorvastatin (LIPITOR) 20 MG tablet Take 20 mg by mouth every evening.     . calcitRIOL (ROCALTROL) 0.25 MCG capsule Take 0.25 mcg by mouth every Monday, Wednesday, and Friday.     . diazepam (VALIUM) 5 MG tablet Take 1 tablet (5 mg total) by mouth 2 (two) times daily as needed (for muscle spasm). 10 tablet 0  . donepezil (ARICEPT) 10 MG tablet Take 1 tablet daily 90 tablet 3  . furosemide (LASIX) 20 MG tablet Take 20 mg by mouth every morning.     [name]  glimepiride (AMARYL) 4 MG tablet Take 4 mg by mouth 2 (two) times daily.     . insulin glargine (LANTUS SOLOSTAR) 100 UNIT/ML injection Inject 8-16 Units into the skin at bedtime.    . sitaGLIPtin (JANUVIA) 50 MG tablet Take 50 mg by mouth every morning.     . tamsulosin (FLOMAX) 0.4 MG CAPS Take 0.4 mg by mouth every morning.    . vitamin B-12 (CYANOCOBALAMIN) 1000 MCG tablet Take 1,000 mcg by mouth every morning.      No current facility-administered medications on file prior to visit.      ALLERGIES: Allergies  Allergen Reactions  . Codeine Itching  . Penicillins Itching  . Sulfa Antibiotics     FAMILY HISTORY: No family history on file.  SOCIAL HISTORY: Social History   Socioeconomic History  . Marital status: Married    Spouse name: Not on file  . Number of children: Not on file  . Years of education: Not on file  . Highest education level: Not on file  Occupational History  . Not on file  Social Needs  . Financial resource strain: Not on file  . Food insecurity:    Worry: Not on file    Inability: Not on file  . Transportation needs:    Medical: Not on file    Non-medical: Not on file  Tobacco Use  . Smoking status: Former Smoker    Packs/day: 4.00    Years: 40.00    Pack years: 160.00    Types: Cigarettes    Last attempt to quit: 11/17/1992    Years since quitting: 25.3  . Smokeless tobacco: Never Used  Substance and Sexual Activity  . Alcohol use: No  . Drug use: No  . Sexual activity: Not on file  Lifestyle  . Physical activity:    Days per week: Not on file    Minutes per session: Not on file  . Stress: Not on file  Relationships  . Social connections:    Talks on phone: Not on file    Gets together: Not on file    Attends religious service: Not on file    Active member of club or organization: Not on file    Attends meetings of clubs or organizations: Not on file    Relationship status: Not on file  . Intimate partner violence:    Fear of current or ex partner: Not on file    Emotionally abused: Not on file    Physically abused: Not on file    Forced sexual activity: Not on file  Other Topics Concern  . Not on file  Social History Narrative   Lives with wife in a one story home.  Has 4 children.  Retired Psychologist, sport and exercise.      REVIEW OF SYSTEMS: Constitutional: No fevers, chills, or sweats, no generalized fatigue, change in appetite Eyes: No visual changes, double vision, eye pain Ear, nose and throat: No hearing loss, ear pain,  nasal congestion, sore throat Cardiovascular: No chest pain, palpitations Respiratory:  No shortness of breath at rest or with exertion, wheezes GastrointestinaI: No nausea, vomiting, diarrhea, abdominal pain, fecal incontinence Genitourinary:  No dysuria, urinary retention or frequency Musculoskeletal:  No neck pain, back pain Integumentary: No rash, pruritus, skin lesions Neurological: as above Psychiatric: No depression, insomnia, anxiety Endocrine: No palpitations, fatigue, diaphoresis, mood swings, change in appetite, change in weight, increased thirst Hematologic/Lymphatic:  No anemia, purpura, petechiae. Allergic/Immunologic: no itchy/runny eyes, nasal congestion, recent allergic reactions, rashes  PHYSICAL EXAM: Vitals:   03/07/18 1454  BP: 136/64  Pulse: 66  SpO2: 96%   General: No acute distress Head:  Normocephalic/atraumatic Neck: supple, no paraspinal tenderness, full range of motion Heart:  Regular rate and rhythm Lungs:  Clear to auscultation bilaterally Back: No paraspinal tenderness Skin/Extremities: No rash, no edema Neurological Exam: alert and oriented to person, place, date but forgot year. No aphasia or dysarthria. Fund of knowledge is appropriate.  Remote memory intact. 2/3 delayed recall, he named one of the words given on his last visit 8 months ago. Attention and concentration are normal.    Able to name objects and repeat phrases. Cranial nerves: Pupils equal, round, reactive to light. No facial asymmetry. Motor: no cogwheeling, 5/5 throughout with no pronator drift. No incoordination on finger to nose testing. Gait narrow-based and steady, no ataxia.  IMPRESSION: This is an 84 yo RH man with vascular risk factors including hypertension, hyperlipidemia, diabetes, with mild dementia without behavioral disturbance. MMSE in May 2018 was 24/30 (21/30 in May 2018, 25/30 in November 2017). Symptoms suggestive of mild dementia, likely Alzheimer's type. Head CT  without contrast no acute changes. TSH and B12 normal. Memory stable per wife. Continue Aricept 10mg  daily, refills were sent. We again discussed mood issues that can occur with dementia, continue to monitor. He is concerned about muscle cramps and poor sleep, we will try low dose gabapentin 100mg  qhs, side effects discussed. We again discussed the importance of control of vascular risk factors, physical exercise, and brain stimulation exercises for brain health. He will follow-up in 6 months and knows to call for any changes.  Thank you for allowing me to participate in his care.  Please do not hesitate to call for any questions or concerns.  The duration of this appointment visit was 30 minutes of face-to-face time with the patient.  Greater than 50% of this time was spent in counseling, explanation of diagnosis, planning of further management, and coordination of care.   Samuel [name], M.D.   CC:Dr. Tisovec

## 2018-03-08 DIAGNOSIS — Z125 Encounter for screening for malignant neoplasm of prostate: Secondary | ICD-10-CM | POA: Diagnosis not present

## 2018-03-08 DIAGNOSIS — R82998 Other abnormal findings in urine: Secondary | ICD-10-CM | POA: Diagnosis not present

## 2018-03-08 DIAGNOSIS — E7800 Pure hypercholesterolemia, unspecified: Secondary | ICD-10-CM | POA: Diagnosis not present

## 2018-03-08 DIAGNOSIS — N184 Chronic kidney disease, stage 4 (severe): Secondary | ICD-10-CM | POA: Diagnosis not present

## 2018-03-08 DIAGNOSIS — M109 Gout, unspecified: Secondary | ICD-10-CM | POA: Diagnosis not present

## 2018-03-08 DIAGNOSIS — E1129 Type 2 diabetes mellitus with other diabetic kidney complication: Secondary | ICD-10-CM | POA: Diagnosis not present

## 2018-03-14 ENCOUNTER — Encounter: Payer: Self-pay | Admitting: Neurology

## 2018-03-15 DIAGNOSIS — I129 Hypertensive chronic kidney disease with stage 1 through stage 4 chronic kidney disease, or unspecified chronic kidney disease: Secondary | ICD-10-CM | POA: Diagnosis not present

## 2018-03-15 DIAGNOSIS — H9193 Unspecified hearing loss, bilateral: Secondary | ICD-10-CM | POA: Diagnosis not present

## 2018-03-15 DIAGNOSIS — Z0000 Encounter for general adult medical examination without abnormal findings: Secondary | ICD-10-CM | POA: Diagnosis not present

## 2018-03-15 DIAGNOSIS — N2581 Secondary hyperparathyroidism of renal origin: Secondary | ICD-10-CM | POA: Diagnosis not present

## 2018-03-15 DIAGNOSIS — D508 Other iron deficiency anemias: Secondary | ICD-10-CM | POA: Diagnosis not present

## 2018-03-15 DIAGNOSIS — E1129 Type 2 diabetes mellitus with other diabetic kidney complication: Secondary | ICD-10-CM | POA: Diagnosis not present

## 2018-03-15 DIAGNOSIS — Z6827 Body mass index (BMI) 27.0-27.9, adult: Secondary | ICD-10-CM | POA: Diagnosis not present

## 2018-03-15 DIAGNOSIS — N320 Bladder-neck obstruction: Secondary | ICD-10-CM | POA: Diagnosis not present

## 2018-03-15 DIAGNOSIS — R413 Other amnesia: Secondary | ICD-10-CM | POA: Diagnosis not present

## 2018-03-15 DIAGNOSIS — N184 Chronic kidney disease, stage 4 (severe): Secondary | ICD-10-CM | POA: Diagnosis not present

## 2018-03-15 DIAGNOSIS — Z1389 Encounter for screening for other disorder: Secondary | ICD-10-CM | POA: Diagnosis not present

## 2018-03-15 DIAGNOSIS — D692 Other nonthrombocytopenic purpura: Secondary | ICD-10-CM | POA: Diagnosis not present

## 2018-03-17 DIAGNOSIS — Z1212 Encounter for screening for malignant neoplasm of rectum: Secondary | ICD-10-CM | POA: Diagnosis not present

## 2018-04-14 DIAGNOSIS — E119 Type 2 diabetes mellitus without complications: Secondary | ICD-10-CM | POA: Diagnosis not present

## 2018-04-14 DIAGNOSIS — Z961 Presence of intraocular lens: Secondary | ICD-10-CM | POA: Diagnosis not present

## 2018-04-14 DIAGNOSIS — Z794 Long term (current) use of insulin: Secondary | ICD-10-CM | POA: Diagnosis not present

## 2018-06-20 DIAGNOSIS — M545 Low back pain: Secondary | ICD-10-CM | POA: Diagnosis not present

## 2018-06-20 DIAGNOSIS — Z23 Encounter for immunization: Secondary | ICD-10-CM | POA: Diagnosis not present

## 2018-06-20 DIAGNOSIS — E1129 Type 2 diabetes mellitus with other diabetic kidney complication: Secondary | ICD-10-CM | POA: Diagnosis not present

## 2018-06-20 DIAGNOSIS — Z794 Long term (current) use of insulin: Secondary | ICD-10-CM | POA: Diagnosis not present

## 2018-06-20 DIAGNOSIS — N184 Chronic kidney disease, stage 4 (severe): Secondary | ICD-10-CM | POA: Diagnosis not present

## 2018-06-20 DIAGNOSIS — I10 Essential (primary) hypertension: Secondary | ICD-10-CM | POA: Diagnosis not present

## 2018-06-20 DIAGNOSIS — Z6827 Body mass index (BMI) 27.0-27.9, adult: Secondary | ICD-10-CM | POA: Diagnosis not present

## 2018-06-20 DIAGNOSIS — I129 Hypertensive chronic kidney disease with stage 1 through stage 4 chronic kidney disease, or unspecified chronic kidney disease: Secondary | ICD-10-CM | POA: Diagnosis not present

## 2018-06-20 DIAGNOSIS — D692 Other nonthrombocytopenic purpura: Secondary | ICD-10-CM | POA: Diagnosis not present

## 2018-06-20 DIAGNOSIS — E7800 Pure hypercholesterolemia, unspecified: Secondary | ICD-10-CM | POA: Diagnosis not present

## 2018-06-20 DIAGNOSIS — R413 Other amnesia: Secondary | ICD-10-CM | POA: Diagnosis not present

## 2018-06-20 DIAGNOSIS — D508 Other iron deficiency anemias: Secondary | ICD-10-CM | POA: Diagnosis not present

## 2018-06-22 DIAGNOSIS — N2581 Secondary hyperparathyroidism of renal origin: Secondary | ICD-10-CM | POA: Diagnosis not present

## 2018-06-22 DIAGNOSIS — D631 Anemia in chronic kidney disease: Secondary | ICD-10-CM | POA: Diagnosis not present

## 2018-06-22 DIAGNOSIS — N184 Chronic kidney disease, stage 4 (severe): Secondary | ICD-10-CM | POA: Diagnosis not present

## 2018-06-22 DIAGNOSIS — D509 Iron deficiency anemia, unspecified: Secondary | ICD-10-CM | POA: Diagnosis not present

## 2018-06-22 DIAGNOSIS — M109 Gout, unspecified: Secondary | ICD-10-CM | POA: Diagnosis not present

## 2018-06-22 DIAGNOSIS — I129 Hypertensive chronic kidney disease with stage 1 through stage 4 chronic kidney disease, or unspecified chronic kidney disease: Secondary | ICD-10-CM | POA: Diagnosis not present

## 2018-06-22 DIAGNOSIS — E1122 Type 2 diabetes mellitus with diabetic chronic kidney disease: Secondary | ICD-10-CM | POA: Diagnosis not present

## 2018-06-22 DIAGNOSIS — E7800 Pure hypercholesterolemia, unspecified: Secondary | ICD-10-CM | POA: Diagnosis not present

## 2018-07-23 ENCOUNTER — Other Ambulatory Visit: Payer: Self-pay | Admitting: Neurology

## 2018-08-17 DIAGNOSIS — H903 Sensorineural hearing loss, bilateral: Secondary | ICD-10-CM | POA: Diagnosis not present

## 2018-08-17 DIAGNOSIS — H6123 Impacted cerumen, bilateral: Secondary | ICD-10-CM | POA: Diagnosis not present

## 2018-08-17 DIAGNOSIS — Z972 Presence of dental prosthetic device (complete) (partial): Secondary | ICD-10-CM | POA: Diagnosis not present

## 2018-08-17 DIAGNOSIS — Z87891 Personal history of nicotine dependence: Secondary | ICD-10-CM | POA: Diagnosis not present

## 2018-08-17 DIAGNOSIS — H9319 Tinnitus, unspecified ear: Secondary | ICD-10-CM | POA: Diagnosis not present

## 2018-08-17 DIAGNOSIS — M270 Developmental disorders of jaws: Secondary | ICD-10-CM | POA: Diagnosis not present

## 2018-10-03 DIAGNOSIS — E7800 Pure hypercholesterolemia, unspecified: Secondary | ICD-10-CM | POA: Diagnosis not present

## 2018-10-03 DIAGNOSIS — E1129 Type 2 diabetes mellitus with other diabetic kidney complication: Secondary | ICD-10-CM | POA: Diagnosis not present

## 2018-10-03 DIAGNOSIS — E11319 Type 2 diabetes mellitus with unspecified diabetic retinopathy without macular edema: Secondary | ICD-10-CM | POA: Diagnosis not present

## 2018-10-03 DIAGNOSIS — D508 Other iron deficiency anemias: Secondary | ICD-10-CM | POA: Diagnosis not present

## 2018-10-03 DIAGNOSIS — I10 Essential (primary) hypertension: Secondary | ICD-10-CM | POA: Diagnosis not present

## 2018-10-03 DIAGNOSIS — I129 Hypertensive chronic kidney disease with stage 1 through stage 4 chronic kidney disease, or unspecified chronic kidney disease: Secondary | ICD-10-CM | POA: Diagnosis not present

## 2018-10-03 DIAGNOSIS — N400 Enlarged prostate without lower urinary tract symptoms: Secondary | ICD-10-CM | POA: Diagnosis not present

## 2018-10-03 DIAGNOSIS — Z6827 Body mass index (BMI) 27.0-27.9, adult: Secondary | ICD-10-CM | POA: Diagnosis not present

## 2018-10-03 DIAGNOSIS — M109 Gout, unspecified: Secondary | ICD-10-CM | POA: Diagnosis not present

## 2018-10-03 DIAGNOSIS — R413 Other amnesia: Secondary | ICD-10-CM | POA: Diagnosis not present

## 2018-10-03 DIAGNOSIS — N2581 Secondary hyperparathyroidism of renal origin: Secondary | ICD-10-CM | POA: Diagnosis not present

## 2018-10-03 DIAGNOSIS — N184 Chronic kidney disease, stage 4 (severe): Secondary | ICD-10-CM | POA: Diagnosis not present

## 2018-10-09 ENCOUNTER — Other Ambulatory Visit: Payer: Self-pay | Admitting: Urology

## 2018-10-24 DIAGNOSIS — F0390 Unspecified dementia without behavioral disturbance: Secondary | ICD-10-CM | POA: Diagnosis not present

## 2018-10-24 DIAGNOSIS — N401 Enlarged prostate with lower urinary tract symptoms: Secondary | ICD-10-CM | POA: Diagnosis not present

## 2018-10-24 DIAGNOSIS — N2581 Secondary hyperparathyroidism of renal origin: Secondary | ICD-10-CM | POA: Diagnosis not present

## 2018-10-24 DIAGNOSIS — N320 Bladder-neck obstruction: Secondary | ICD-10-CM | POA: Diagnosis not present

## 2018-10-24 DIAGNOSIS — M109 Gout, unspecified: Secondary | ICD-10-CM | POA: Diagnosis not present

## 2018-10-24 DIAGNOSIS — H04123 Dry eye syndrome of bilateral lacrimal glands: Secondary | ICD-10-CM | POA: Diagnosis not present

## 2018-10-24 DIAGNOSIS — N138 Other obstructive and reflux uropathy: Secondary | ICD-10-CM | POA: Diagnosis not present

## 2018-10-24 DIAGNOSIS — E7800 Pure hypercholesterolemia, unspecified: Secondary | ICD-10-CM | POA: Diagnosis not present

## 2018-10-24 DIAGNOSIS — D631 Anemia in chronic kidney disease: Secondary | ICD-10-CM | POA: Diagnosis not present

## 2018-10-24 DIAGNOSIS — H43392 Other vitreous opacities, left eye: Secondary | ICD-10-CM | POA: Diagnosis not present

## 2018-10-24 DIAGNOSIS — N184 Chronic kidney disease, stage 4 (severe): Secondary | ICD-10-CM | POA: Diagnosis not present

## 2018-10-24 DIAGNOSIS — N189 Chronic kidney disease, unspecified: Secondary | ICD-10-CM | POA: Diagnosis not present

## 2018-10-24 DIAGNOSIS — E1122 Type 2 diabetes mellitus with diabetic chronic kidney disease: Secondary | ICD-10-CM | POA: Diagnosis not present

## 2018-10-24 DIAGNOSIS — I129 Hypertensive chronic kidney disease with stage 1 through stage 4 chronic kidney disease, or unspecified chronic kidney disease: Secondary | ICD-10-CM | POA: Diagnosis not present

## 2018-10-30 ENCOUNTER — Encounter: Payer: Self-pay | Admitting: Neurology

## 2018-10-30 ENCOUNTER — Other Ambulatory Visit: Payer: Self-pay

## 2018-10-30 ENCOUNTER — Ambulatory Visit (INDEPENDENT_AMBULATORY_CARE_PROVIDER_SITE_OTHER): Payer: Medicare Other | Admitting: Neurology

## 2018-10-30 ENCOUNTER — Telehealth: Payer: Self-pay | Admitting: Neurology

## 2018-10-30 VITALS — BP 164/60 | HR 77 | Ht 67.0 in | Wt 162.0 lb

## 2018-10-30 DIAGNOSIS — F329 Major depressive disorder, single episode, unspecified: Secondary | ICD-10-CM | POA: Diagnosis not present

## 2018-10-30 DIAGNOSIS — F03918 Unspecified dementia, unspecified severity, with other behavioral disturbance: Secondary | ICD-10-CM

## 2018-10-30 DIAGNOSIS — R252 Cramp and spasm: Secondary | ICD-10-CM | POA: Diagnosis not present

## 2018-10-30 DIAGNOSIS — F0391 Unspecified dementia with behavioral disturbance: Secondary | ICD-10-CM

## 2018-10-30 DIAGNOSIS — F0390 Unspecified dementia without behavioral disturbance: Secondary | ICD-10-CM | POA: Diagnosis not present

## 2018-10-30 DIAGNOSIS — F32A Depression, unspecified: Secondary | ICD-10-CM

## 2018-10-30 DIAGNOSIS — F03A0 Unspecified dementia, mild, without behavioral disturbance, psychotic disturbance, mood disturbance, and anxiety: Secondary | ICD-10-CM

## 2018-10-30 MED ORDER — GABAPENTIN 100 MG PO CAPS: 100.0000 mg | ORAL_CAPSULE | Freq: Every day | ORAL | 3 refills | Status: DC

## 2018-10-30 MED ORDER — DONEPEZIL HCL 10 MG PO TABS: ORAL_TABLET | ORAL | 3 refills | Status: DC

## 2018-10-30 MED ORDER — MIRTAZAPINE 15 MG PO TABS: 15.0000 mg | ORAL_TABLET | Freq: Every day | ORAL | 11 refills | Status: DC

## 2018-10-30 NOTE — Patient Instructions (Addendum)
1. Start mirtazapine 15mg  every night. If still having sleep difficulties, call our office in a week and we can increase dose. Stop the Nyquil  2. Continue gabapentin 100mg  every night and Aricept 10mg  daily  3. Continue to monitor driving  4. Follow-up in 6-8 months, call for any changes  FALL PRECAUTIONS: Be cautious when walking. Scan the area for obstacles that may increase the risk of trips and falls. When getting up in the mornings, sit up at the edge of the bed for a few minutes before getting out of bed. Consider elevating the bed at the head end to avoid drop of blood pressure when getting up. Walk always in a well-lit room (use night lights in the walls). Avoid area rugs or power cords from appliances in the middle of the walkways. Use a walker or a cane if necessary and consider physical therapy for balance exercise. Get your eyesight checked regularly.  FINANCIAL OVERSIGHT: Supervision, especially oversight when making financial decisions or transactions is also recommended.  HOME SAFETY: Consider the safety of the kitchen when operating appliances like stoves, microwave oven, and blender. Consider having supervision and share cooking responsibilities until no longer able to participate in those. Accidents with firearms and other hazards in the house should be identified and addressed as well.  DRIVING: Regarding driving, in patients with progressive memory problems, driving will be impaired. We advise to have someone else do the driving if trouble finding directions or if minor accidents are reported. Independent driving assessment is available to determine safety of driving.  ABILITY TO BE LEFT ALONE: If patient is unable to contact 911 operator, consider using LifeLine, or when the need is there, arrange for someone to stay with patients. Smoking is a fire hazard, consider supervision or cessation. Risk of wandering should be assessed by caregiver and if detected at any point,  supervision and safe proof recommendations should be instituted.  MEDICATION SUPERVISION: Inability to self-administer medication needs to be constantly addressed. Implement a mechanism to ensure safe administration of the medications.  RECOMMENDATIONS FOR ALL PATIENTS WITH MEMORY PROBLEMS: 1. Continue to exercise (Recommend 30 minutes of walking everyday, or 3 hours every week) 2. Increase social interactions - continue going to [place] and enjoy social gatherings with friends and family 3. Eat healthy, avoid fried foods and eat more fruits and vegetables 4. Maintain adequate blood pressure, blood sugar, and blood cholesterol level. Reducing the risk of stroke and cardiovascular disease also helps promoting better memory. 5. Avoid stressful situations. Live a simple life and avoid aggravations. Organize your time and prepare for the next day in anticipation. 6. Sleep well, avoid any interruptions of sleep and avoid any distractions in the bedroom that may interfere with adequate sleep quality 7. Avoid sugar, avoid sweets as there is a strong link between excessive sugar intake, diabetes, and cognitive impairment The Mediterranean diet has been shown to help patients reduce the risk of progressive memory disorders and reduces cardiovascular risk. This includes eating fish, eat fruits and green leafy vegetables, nuts like almonds and hazelnuts, walnuts, and also use olive oil. Avoid fast foods and fried foods as much as possible. Avoid sweets and sugar as sugar use has been linked to worsening of memory function.  There is always a concern of gradual progression of memory problems. If this is the case, then we may need to adjust level of care according to patient needs. Support, both to the patient and caregiver, should then be put into place.

## 2018-10-30 NOTE — Telephone Encounter (Signed)
Spoke with pt's son in the office just prior to visit with Dr. Delice Lesch

## 2018-10-30 NOTE — Telephone Encounter (Signed)
Patient's son just wanted to give you info before his fathers appt this afternoon. Please call him at [phone]. He knows he isnt on the DPR he is coming to appt today to get that info signed by father but he just wanted to give you an update on what was going on with his dad and for you to just listen before appt. HE said his dad gets irritated if he was to tell some info during appt. Thanks!

## 2018-10-30 NOTE — Progress Notes (Signed)
NEUROLOGY FOLLOW UP OFFICE NOTE  STRATTON VILLWOCK [id number] 1930-04-14  HISTORY OF PRESENT ILLNESS: I had the pleasure of seeing Samuel [name] in follow-up in the neurology clinic on 10/30/2018.  The patient was last seen a year ago for mild dementia, and is again accompanied by his wife and son who help supplement the history today. MMSE 24/30 in May 2018. He is on Donepezil 10mg  daily. He was started on low dose gabapentin 100mg  qhs on his last visit for muscle cramps at night. He denies any side effects on medications. Since his last visit, he feels his memory is fine. His family has noticed more behavioral changes. He continues to manage his own pillbox, his wife reports he is good with taking them but she asks him if he has taken them. His wife manages finances. He continues to drive, his wife does not like riding with him but is with him most of the time. He denies getting lost or in any accidents, he gets a little confused in unfamiliar roads. He is independent with dressing and bathing. He is having sleep difficulties and has been taking Nyquil every night. Family has noticed he is more irritable (wife notes he has "always had an anger issue"). He is becoming more hyperfocused on things. He started having paranoia, accusing a long-time co-worker of stealing property. He denies any headaches, dizziness, vision changes, focal numbness/tingling/weakness, no falls. He had an episode of decreased responsiveness 2 months ago in the setting of hypoglycemia. He has had 2 episodes of urinary incontinence. He has been having left shoulder pain 2-3 months ago.   HPI 07/13/2016: This is an 85 yo RH man with a history of diabetes, hyperlipidemia, hypertension, hyperparathyroidism, with worsening memory. His wife started noticing memory changes around 5 years ago and noticed it has been progressively worsening. He would ask what day it is, repeats himself, she has to remind him about his medications. He  misplaces things frequently. He does recognive that he cannot remember names and "several other things, but I cannot remember what." His wife is in charge of bill payments. They have house help for several hours 5 days a week. No difficulties with ADLs. He denies getting lost driving. He notices occasional word-finding difficulties. He has called his son to fix the TV when it gets off channel. His wife has noticed he is more irritable and has occasional anger problems.   His mother had dementia at 75. His father had a cerebral aneurysm. He denies any significant head injuries. He stopped alcohol in the 1970s.  PAST MEDICAL HISTORY: Past Medical History:  Diagnosis Date  . BPH (benign prostatic hyperplasia)   . Bruise    LEFT BACK/SHOULDER AREA  . Chronic kidney disease (CKD), stage III (moderate) (HCC) BASE CRE  1.89  -- 2.2   NEPHROLOGIST-- [name]--  LOV DEC 2013  W/ CHART  . Diabetes mellitus    PCP--  [name]  . History of kidney stones   . Hypercholesteremia   . Hyperparathyroidism, secondary ([place])   . Hypertension   . Iron deficiency anemia   . Left shoulder strain    PT FELL ON ICE 11-06-2012    MEDICATIONS: Current Outpatient Medications on File Prior to Visit  Medication Sig Dispense Refill  . allopurinol (ZYLOPRIM) 100 MG tablet Take 100 mg by mouth every evening.    [name] amLODipine (NORVASC) 10 MG tablet Take 10 mg by mouth every evening.     [name] aspirin EC 81  MG tablet Take 81 mg by mouth every evening.     [name] atorvastatin (LIPITOR) 20 MG tablet Take 20 mg by mouth every evening.     . calcitRIOL (ROCALTROL) 0.25 MCG capsule Take 0.25 mcg by mouth every Monday, Wednesday, and Friday.     . diazepam (VALIUM) 5 MG tablet Take 1 tablet (5 mg total) by mouth 2 (two) times daily as needed (for muscle spasm). 10 tablet 0  . donepezil (ARICEPT) 10 MG tablet Take 1 tablet daily 90 tablet 3  . furosemide (LASIX) 20 MG tablet Take 20 mg by mouth every morning.     .  gabapentin (NEURONTIN) 100 MG capsule TAKE 1 CAPSULE (100 MG TOTAL) BY MOUTH AT BEDTIME. 90 capsule 4  . glimepiride (AMARYL) 4 MG tablet Take 4 mg by mouth 2 (two) times daily.     . insulin glargine (LANTUS SOLOSTAR) 100 UNIT/ML injection Inject 8-16 Units into the skin at bedtime.    . sitaGLIPtin (JANUVIA) 50 MG tablet Take 50 mg by mouth every morning.     . tamsulosin (FLOMAX) 0.4 MG CAPS Take 0.4 mg by mouth every morning.    . vitamin B-12 (CYANOCOBALAMIN) 1000 MCG tablet Take 1,000 mcg by mouth every morning.      No current facility-administered medications on file prior to visit.     ALLERGIES: Allergies  Allergen Reactions  . Codeine Itching  . Penicillins Itching  . Sulfa Antibiotics     FAMILY HISTORY: No family history on file.  SOCIAL HISTORY: Social History   Socioeconomic History  . Marital status: Married    Spouse name: Not on file  . Number of children: Not on file  . Years of education: Not on file  . Highest education level: Not on file  Occupational History  . Not on file  Social Needs  . Financial resource strain: Not on file  . Food insecurity:    Worry: Not on file    Inability: Not on file  . Transportation needs:    Medical: Not on file    Non-medical: Not on file  Tobacco Use  . Smoking status: Former Smoker    Packs/day: 4.00    Years: 40.00    Pack years: 160.00    Types: Cigarettes    Last attempt to quit: 11/17/1992    Years since quitting: 25.9  . Smokeless tobacco: Never Used  Substance and Sexual Activity  . Alcohol use: No  . Drug use: No  . Sexual activity: Not on file  Lifestyle  . Physical activity:    Days per week: Not on file    Minutes per session: Not on file  . Stress: Not on file  Relationships  . Social connections:    Talks on phone: Not on file    Gets together: Not on file    Attends religious service: Not on file    Active member of club or organization: Not on file    Attends meetings of clubs or  organizations: Not on file    Relationship status: Not on file  . Intimate partner violence:    Fear of current or ex partner: Not on file    Emotionally abused: Not on file    Physically abused: Not on file    Forced sexual activity: Not on file  Other Topics Concern  . Not on file  Social History Narrative   Lives with wife in a one story home.  Has 4 children.  Retired  farmer.      REVIEW OF SYSTEMS: Constitutional: No fevers, chills, or sweats, no generalized fatigue, change in appetite Eyes: No visual changes, double vision, eye pain Ear, nose and throat: No hearing loss, ear pain, nasal congestion, sore throat Cardiovascular: No chest pain, palpitations Respiratory:  No shortness of breath at rest or with exertion, wheezes GastrointestinaI: No nausea, vomiting, diarrhea, abdominal pain, fecal incontinence Genitourinary:  No dysuria, urinary retention or frequency Musculoskeletal:  No neck pain, back pain Integumentary: No rash, pruritus, skin lesions Neurological: as above Psychiatric: No depression, insomnia, anxiety Endocrine: No palpitations, fatigue, diaphoresis, mood swings, change in appetite, change in weight, increased thirst Hematologic/Lymphatic:  No anemia, purpura, petechiae. Allergic/Immunologic: no itchy/runny eyes, nasal congestion, recent allergic reactions, rashes  PHYSICAL EXAM: Vitals:   10/30/18 1533  BP: (!) 164/60  Pulse: 77  SpO2: 97%   General: No acute distress Head:  Normocephalic/atraumatic Neck: supple, no paraspinal tenderness, full range of motion Heart:  Regular rate and rhythm Lungs:  Clear to auscultation bilaterally Back: No paraspinal tenderness Skin/Extremities: No rash, no edema Neurological Exam: alert and oriented to person, place, date but forgot year. No aphasia or dysarthria. Fund of knowledge is appropriate.  Remote memory intact. Attention and concentration are normal.    Able to name objects and repeat phrases. MMSE - Mini  Mental State Exam 10/30/2018 09/07/2017 01/19/2017  Orientation to time 2 4 3   Orientation to Place 3 4 4   Registration 3 3 3   Attention/ Calculation 5 5 2   Recall 0 0 0  Language- name 2 objects 2 2 2   Language- repeat 1 1 1   Language- follow 3 step command 3 3 3   Language- read & follow direction 1 1 1   Write a sentence 1 1 1   Copy design 0 0 1  Total score 21 24 21    Cranial nerves: Pupils equal, round, reactive to light. No facial asymmetry. Motor: no cogwheeling, 5/5 throughout with no pronator drift. No incoordination on finger to nose testing. Gait narrow-based and steady, no ataxia.  IMPRESSION: This is an 85 yo RH man with vascular risk factors including hypertension, hyperlipidemia, diabetes, with mild to moderate dementia, now with behavioral changes (paranoia, increased irritability). MMSE today 21/30. He is also having sleep issues, we discussed starting mirtazapine 15mg  qhs to help with mood stabilization and sleep, side effects discussed, we may increase dose as tolerated. He was instructed to stop Nyquil. Continue Donepezil 10mg  daily and gabapentin 100mg  qhs. His family was instructed to monitor medications more closely. Continue to monitor driving. We again discussed the importance of control of vascular risk factors, physical exercise, and brain stimulation exercises for brain health. He will follow-up in 6-8 months and knows to call for any changes.  Thank you for allowing me to participate in his care.  Please do not hesitate to call for any questions or concerns.  The duration of this appointment visit was 30 minutes of face-to-face time with the patient.  Greater than 50% of this time was spent in counseling, explanation of diagnosis, planning of further management, and coordination of care.   Samuel [name], M.D.   CC:Dr. Tisovec

## 2018-11-07 DIAGNOSIS — I129 Hypertensive chronic kidney disease with stage 1 through stage 4 chronic kidney disease, or unspecified chronic kidney disease: Secondary | ICD-10-CM | POA: Diagnosis not present

## 2018-11-22 ENCOUNTER — Other Ambulatory Visit: Payer: Self-pay | Admitting: Neurology

## 2018-11-22 DIAGNOSIS — F329 Major depressive disorder, single episode, unspecified: Secondary | ICD-10-CM

## 2018-11-22 DIAGNOSIS — F32A Depression, unspecified: Secondary | ICD-10-CM

## 2018-11-22 DIAGNOSIS — F03918 Unspecified dementia, unspecified severity, with other behavioral disturbance: Secondary | ICD-10-CM

## 2018-11-22 DIAGNOSIS — F0391 Unspecified dementia with behavioral disturbance: Secondary | ICD-10-CM

## 2019-01-11 DIAGNOSIS — I129 Hypertensive chronic kidney disease with stage 1 through stage 4 chronic kidney disease, or unspecified chronic kidney disease: Secondary | ICD-10-CM | POA: Diagnosis not present

## 2019-01-11 DIAGNOSIS — E1129 Type 2 diabetes mellitus with other diabetic kidney complication: Secondary | ICD-10-CM | POA: Diagnosis not present

## 2019-01-11 DIAGNOSIS — R413 Other amnesia: Secondary | ICD-10-CM | POA: Diagnosis not present

## 2019-01-11 DIAGNOSIS — M109 Gout, unspecified: Secondary | ICD-10-CM | POA: Diagnosis not present

## 2019-01-11 DIAGNOSIS — E11319 Type 2 diabetes mellitus with unspecified diabetic retinopathy without macular edema: Secondary | ICD-10-CM | POA: Diagnosis not present

## 2019-01-11 DIAGNOSIS — N184 Chronic kidney disease, stage 4 (severe): Secondary | ICD-10-CM | POA: Diagnosis not present

## 2019-01-11 DIAGNOSIS — Z794 Long term (current) use of insulin: Secondary | ICD-10-CM | POA: Diagnosis not present

## 2019-01-11 DIAGNOSIS — E7800 Pure hypercholesterolemia, unspecified: Secondary | ICD-10-CM | POA: Diagnosis not present

## 2019-03-16 DIAGNOSIS — Z125 Encounter for screening for malignant neoplasm of prostate: Secondary | ICD-10-CM | POA: Diagnosis not present

## 2019-03-16 DIAGNOSIS — M109 Gout, unspecified: Secondary | ICD-10-CM | POA: Diagnosis not present

## 2019-03-16 DIAGNOSIS — E7800 Pure hypercholesterolemia, unspecified: Secondary | ICD-10-CM | POA: Diagnosis not present

## 2019-03-16 DIAGNOSIS — E1129 Type 2 diabetes mellitus with other diabetic kidney complication: Secondary | ICD-10-CM | POA: Diagnosis not present

## 2019-03-20 ENCOUNTER — Encounter (HOSPITAL_COMMUNITY): Payer: Self-pay

## 2019-03-20 ENCOUNTER — Emergency Department (HOSPITAL_COMMUNITY): Payer: Medicare Other

## 2019-03-20 ENCOUNTER — Other Ambulatory Visit: Payer: Self-pay

## 2019-03-20 ENCOUNTER — Emergency Department (HOSPITAL_COMMUNITY)
Admission: EM | Admit: 2019-03-20 | Discharge: 2019-03-20 | Disposition: A | Discharge status: Home / Self Care | Payer: Medicare Other | Attending: Emergency Medicine | Admitting: Emergency Medicine

## 2019-03-20 DIAGNOSIS — Z7982 Long term (current) use of aspirin: Secondary | ICD-10-CM | POA: Diagnosis not present

## 2019-03-20 DIAGNOSIS — I10 Essential (primary) hypertension: Secondary | ICD-10-CM | POA: Diagnosis not present

## 2019-03-20 DIAGNOSIS — R82998 Other abnormal findings in urine: Secondary | ICD-10-CM | POA: Diagnosis not present

## 2019-03-20 DIAGNOSIS — Z794 Long term (current) use of insulin: Secondary | ICD-10-CM | POA: Insufficient documentation

## 2019-03-20 DIAGNOSIS — R079 Chest pain, unspecified: Secondary | ICD-10-CM | POA: Diagnosis not present

## 2019-03-20 DIAGNOSIS — E119 Type 2 diabetes mellitus without complications: Secondary | ICD-10-CM | POA: Insufficient documentation

## 2019-03-20 DIAGNOSIS — Z87891 Personal history of nicotine dependence: Secondary | ICD-10-CM | POA: Insufficient documentation

## 2019-03-20 DIAGNOSIS — Z79899 Other long term (current) drug therapy: Secondary | ICD-10-CM | POA: Insufficient documentation

## 2019-03-20 DIAGNOSIS — R072 Precordial pain: Secondary | ICD-10-CM | POA: Diagnosis not present

## 2019-03-20 DIAGNOSIS — R0789 Other chest pain: Secondary | ICD-10-CM

## 2019-03-20 LAB — BASIC METABOLIC PANEL
Anion gap: 10 (ref 5–15)
BUN: 26 mg/dL — ABNORMAL HIGH (ref 8–23)
CO2: 22 mmol/L (ref 22–32)
Calcium: 8.5 mg/dL — ABNORMAL LOW (ref 8.9–10.3)
Chloride: 107 mmol/L (ref 98–111)
Creatinine, Ser: 2.44 mg/dL — ABNORMAL HIGH (ref 0.61–1.24)
GFR calc Af Amer: 27 mL/min — ABNORMAL LOW (ref >60)
GFR calc non Af Amer: 23 mL/min — ABNORMAL LOW (ref >60)
Glucose, Bld: 201 mg/dL — ABNORMAL HIGH (ref 70–99)
Potassium: 5 mmol/L (ref 3.5–5.1)
Sodium: 139 mmol/L (ref 135–145)

## 2019-03-20 LAB — CBC
HCT: 35.4 % — ABNORMAL LOW (ref 39.0–52.0)
Hemoglobin: 11.6 g/dL — ABNORMAL LOW (ref 13.0–17.0)
MCH: 31 pg (ref 26.0–34.0)
MCHC: 32.8 g/dL (ref 30.0–36.0)
MCV: 94.7 fL (ref 80.0–100.0)
Platelets: 189 10*3/uL (ref 150–400)
RBC: 3.74 MIL/uL — ABNORMAL LOW (ref 4.22–5.81)
RDW: 14.9 % (ref 11.5–15.5)
WBC: 7.3 10*3/uL (ref 4.0–10.5)
nRBC: 0 % (ref 0.0–0.2)

## 2019-03-20 LAB — TROPONIN I (HIGH SENSITIVITY): Troponin I (High Sensitivity): 8 ng/L (ref <18)

## 2019-03-20 MED ORDER — SODIUM CHLORIDE 0.9% FLUSH: 3.0000 mL | Freq: Once | INTRAVENOUS | Status: DC

## 2019-03-20 NOTE — ED Notes (Signed)
Patient transported to X-ray 

## 2019-03-20 NOTE — ED Provider Notes (Signed)
[place] EMERGENCY DEPARTMENT Provider Note   CSN: [id number] Arrival date & time: 03/20/19  1146     History   Chief Complaint Chief Complaint  Patient presents with  . Chest Pain  . Shoulder Pain    HPI Samuel REVOLORIO is a 85 y.o. male.     Patient is an 85 year old male with past medical history of hypertension, hyperlipidemia, type 2 diabetes.  He presents today for evaluation of chest discomfort.  Patient states that he has had discomfort to the left upper chest that began 3 weeks ago and has since expanded into the center of his chest.  This is sharp in nature and worse with breathing, movement, and palpation.  He denies any shortness of breath, nausea, diaphoresis, or radiation into the arm.  He denies any exertional symptoms.  Patient called his doctor today and was told to come here for further evaluation.  Patient has no prior cardiac history.  The history is provided by the patient.  Chest Pain Pain location:  Substernal area and L chest Pain quality: sharp   Pain radiates to:  Does not radiate Pain severity:  Moderate Onset quality:  Gradual Duration:  3 weeks Timing:  Constant Progression:  Worsening Chronicity:  New Relieved by:  Nothing Worsened by:  Certain positions and movement Ineffective treatments:  None tried Shoulder Pain   Past Medical History:  Diagnosis Date  . BPH (benign prostatic hyperplasia)   . Bruise    LEFT BACK/SHOULDER AREA  . Chronic kidney disease (CKD), stage III (moderate) (HCC) BASE CRE  1.89  -- 2.2   NEPHROLOGIST-- [name]--  LOV DEC 2013  W/ CHART  . Diabetes mellitus    PCP--  [name]  . History of kidney stones   . Hypercholesteremia   . Hyperparathyroidism, secondary ([place])   . Hypertension   . Iron deficiency anemia   . Left shoulder strain    PT FELL ON ICE 11-06-2012    Patient Active Problem List   Diagnosis Date Noted  . Mild cognitive impairment 08/05/2016  . Essential  hypertension 08/05/2016  . Other hyperlipidemia 08/05/2016  . Type 2 diabetes mellitus without complication, with long-term current use of insulin ([place]) 08/05/2016    Past Surgical History:  Procedure Laterality Date  . BLEPHAROPLASTY Bilateral 04-02-2005  . CYSTO/ LEFT URETERAL STENT PLACEMENT  02-07-2003  . CYSTO/ RIGHT URETER BALLOON DILATION/ STONE EXTRACTION  02-20-2003  . GREEN LIGHT LASER TURP (TRANSURETHRAL RESECTION OF PROSTATE N/A 11/24/2012   Procedure: GREEN LIGHT LASER TURP (TRANSURETHRAL RESECTION OF PROSTATE;  Surgeon: [name], MD;  Location: [facility];  Service: Urology;  Laterality: N/A;  . LEFT URETEROSCOPIC STONE EXTRACTION  02-14-2003        Home Medications    Prior to Admission medications   Medication Sig Start Date End Date Taking? Authorizing Provider  allopurinol (ZYLOPRIM) 100 MG tablet Take 100 mg by mouth every evening.    [provider]  amLODipine (NORVASC) 10 MG tablet Take 10 mg by mouth every evening.     [provider]  aspirin EC 81 MG tablet Take 81 mg by mouth every evening.     [provider]  atorvastatin (LIPITOR) 20 MG tablet Take 20 mg by mouth every evening.     [provider]  calcitRIOL (ROCALTROL) 0.25 MCG capsule Take 0.25 mcg by mouth every Monday, Wednesday, and Friday.     [provider]  donepezil (ARICEPT)  10 MG tablet Take 1 tablet daily 10/30/18   Cameron Sprang, MD  furosemide (LASIX) 20 MG tablet Take 20 mg by mouth every morning.     [provider]  gabapentin (NEURONTIN) 100 MG capsule Take 1 capsule (100 mg total) by mouth at bedtime. 10/30/18   Cameron Sprang, MD  glimepiride (AMARYL) 4 MG tablet Take 4 mg by mouth 2 (two) times daily.     [provider]  insulin glargine (LANTUS SOLOSTAR) 100 UNIT/ML injection Inject 8-16 Units into the skin at bedtime.    [provider]  mirtazapine (REMERON) 15 MG tablet TAKE 1  TABLET BY MOUTH EVERYDAY AT BEDTIME 11/22/18   Cameron Sprang, MD  sitaGLIPtin (JANUVIA) 50 MG tablet Take 50 mg by mouth every morning.     [provider]  tamsulosin (FLOMAX) 0.4 MG CAPS Take 0.4 mg by mouth every morning.    [provider]  vitamin B-12 (CYANOCOBALAMIN) 1000 MCG tablet Take 1,000 mcg by mouth every morning.     [provider]    Family History No family history on file.  Social History Social History   Tobacco Use  . Smoking status: Former Smoker    Packs/day: 4.00    Years: 40.00    Pack years: 160.00    Types: Cigarettes    Quit date: 11/17/1992    Years since quitting: 26.3  . Smokeless tobacco: Never Used  Substance Use Topics  . Alcohol use: No  . Drug use: No     Allergies   Codeine, Penicillins, and Sulfa antibiotics   Review of Systems Review of Systems  Cardiovascular: Positive for chest pain.  All other systems reviewed and are negative.    Physical Exam Updated Vital Signs BP (!) 148/47   Pulse 64   Temp 98 F (36.7 C) (Oral)   Resp 16   SpO2 97%   Physical Exam Vitals signs and nursing note reviewed.  Constitutional:      General: He is not in acute distress.    Appearance: He is well-developed. He is not diaphoretic.  HENT:     Head: Normocephalic and atraumatic.  Neck:     Musculoskeletal: Normal range of motion and neck supple.  Cardiovascular:     Rate and Rhythm: Normal rate and regular rhythm.     Heart sounds: No murmur. No friction rub.  Pulmonary:     Effort: Pulmonary effort is normal. No respiratory distress.     Breath sounds: Normal breath sounds. No wheezing or rales.     Comments: There is tenderness to palpation to the sternum and left upper chest.  This reproduces his symptoms.  There is no palpable abnormality or crepitus. Abdominal:     General: Bowel sounds are normal. There is no distension.     Palpations: Abdomen is soft.     Tenderness: There is no abdominal  tenderness.  Musculoskeletal: Normal range of motion.     Right lower leg: He exhibits no tenderness. No edema.     Left lower leg: He exhibits no tenderness. No edema.  Skin:    General: Skin is warm and dry.  Neurological:     Mental Status: He is alert and oriented to person, place, and time.     Coordination: Coordination normal.      ED Treatments / Results  Labs (all labs ordered are listed, but only abnormal results are displayed) Labs Reviewed  BASIC METABOLIC PANEL  CBC  TROPONIN I (HIGH SENSITIVITY)    EKG EKG Interpretation  Date/Time:  Tuesday March 20 2019 11:55:50 EDT Ventricular Rate:  66 PR Interval:  172 QRS Duration: 126 QT Interval:  440 QTC Calculation: 461 R Axis:   -35 Text Interpretation:  Normal sinus rhythm Left axis deviation Left ventricular hypertrophy with QRS widening Nonspecific T wave abnormality Abnormal ECG Confirmed by Veryl Speak [id number]) on 03/20/2019 12:10:51 PM   Radiology No results found.  Procedures Procedures (including critical care time)  Medications Ordered in ED Medications  sodium chloride flush (NS) 0.9 % injection 3 mL (has no administration in time range)     Initial Impression / Assessment and Plan / ED Course  I have reviewed the triage vital signs and the nursing notes.  Pertinent labs & imaging results that were available during my care of the patient were reviewed by me and considered in my medical decision making (see chart for details).  Patient presenting here with complaints of chest discomfort that has been constant for the past several weeks.  It is sharp in nature and reproducible with palpation and movement.  Patient symptoms sound clearly musculoskeletal.  His EKG is unchanged and troponin is negative.  At this point, I feel as though discharge is appropriate.  I will have the patient take Tylenol and follow-up with primary doctor if not improving.  He understands to return if symptoms worsen or  change.  Final Clinical Impressions(s) / ED Diagnoses   Final diagnoses:  None    ED Discharge Orders    None       Veryl Speak, MD 03/20/19 1340

## 2019-03-20 NOTE — ED Triage Notes (Signed)
Pt reports left shoulder pain that started about 4 months ago but it has now radiated down to his chest, denies any other symptoms. Pt a.o, nad noted.

## 2019-03-20 NOTE — Discharge Instructions (Addendum)
Tylenol 650 mg every 6 hours as needed for pain.  Follow-up with your primary doctor if symptoms are not improving in the next few days, and return to the ER if you develop worsening pain, difficulty breathing, high fever, or other new and concerning symptoms.

## 2019-03-20 NOTE — ED Notes (Signed)
Patient verbalizes understanding of discharge instructions. Opportunity for questioning and answers were provided. Pt discharged from ED. 

## 2019-03-22 DIAGNOSIS — M1990 Unspecified osteoarthritis, unspecified site: Secondary | ICD-10-CM | POA: Diagnosis not present

## 2019-03-22 DIAGNOSIS — N401 Enlarged prostate with lower urinary tract symptoms: Secondary | ICD-10-CM | POA: Diagnosis not present

## 2019-03-22 DIAGNOSIS — R079 Chest pain, unspecified: Secondary | ICD-10-CM | POA: Diagnosis not present

## 2019-03-22 DIAGNOSIS — Z0000 Encounter for general adult medical examination without abnormal findings: Secondary | ICD-10-CM | POA: Diagnosis not present

## 2019-03-22 DIAGNOSIS — R809 Proteinuria, unspecified: Secondary | ICD-10-CM | POA: Diagnosis not present

## 2019-03-22 DIAGNOSIS — K219 Gastro-esophageal reflux disease without esophagitis: Secondary | ICD-10-CM | POA: Diagnosis not present

## 2019-03-22 DIAGNOSIS — N184 Chronic kidney disease, stage 4 (severe): Secondary | ICD-10-CM | POA: Diagnosis not present

## 2019-03-22 DIAGNOSIS — Z1339 Encounter for screening examination for other mental health and behavioral disorders: Secondary | ICD-10-CM | POA: Diagnosis not present

## 2019-03-22 DIAGNOSIS — E1129 Type 2 diabetes mellitus with other diabetic kidney complication: Secondary | ICD-10-CM | POA: Diagnosis not present

## 2019-03-22 DIAGNOSIS — E11319 Type 2 diabetes mellitus with unspecified diabetic retinopathy without macular edema: Secondary | ICD-10-CM | POA: Diagnosis not present

## 2019-03-22 DIAGNOSIS — E7800 Pure hypercholesterolemia, unspecified: Secondary | ICD-10-CM | POA: Diagnosis not present

## 2019-03-22 DIAGNOSIS — F5221 Male erectile disorder: Secondary | ICD-10-CM | POA: Diagnosis not present

## 2019-03-22 DIAGNOSIS — I129 Hypertensive chronic kidney disease with stage 1 through stage 4 chronic kidney disease, or unspecified chronic kidney disease: Secondary | ICD-10-CM | POA: Diagnosis not present

## 2019-04-19 ENCOUNTER — Other Ambulatory Visit: Payer: Self-pay

## 2019-04-19 ENCOUNTER — Emergency Department (HOSPITAL_COMMUNITY)
Admission: EM | Admit: 2019-04-19 | Discharge: 2019-04-20 | Disposition: A | Discharge status: Home / Self Care | Payer: Medicare Other | Attending: Emergency Medicine | Admitting: Emergency Medicine

## 2019-04-19 ENCOUNTER — Encounter (HOSPITAL_COMMUNITY): Payer: Self-pay | Admitting: Emergency Medicine

## 2019-04-19 DIAGNOSIS — Z794 Long term (current) use of insulin: Secondary | ICD-10-CM | POA: Insufficient documentation

## 2019-04-19 DIAGNOSIS — E119 Type 2 diabetes mellitus without complications: Secondary | ICD-10-CM | POA: Diagnosis not present

## 2019-04-19 DIAGNOSIS — Z7982 Long term (current) use of aspirin: Secondary | ICD-10-CM | POA: Insufficient documentation

## 2019-04-19 DIAGNOSIS — I10 Essential (primary) hypertension: Secondary | ICD-10-CM | POA: Diagnosis not present

## 2019-04-19 DIAGNOSIS — Z79899 Other long term (current) drug therapy: Secondary | ICD-10-CM | POA: Insufficient documentation

## 2019-04-19 DIAGNOSIS — F0390 Unspecified dementia without behavioral disturbance: Secondary | ICD-10-CM | POA: Diagnosis not present

## 2019-04-19 DIAGNOSIS — Z87891 Personal history of nicotine dependence: Secondary | ICD-10-CM | POA: Diagnosis not present

## 2019-04-19 DIAGNOSIS — F0391 Unspecified dementia with behavioral disturbance: Secondary | ICD-10-CM | POA: Diagnosis not present

## 2019-04-19 DIAGNOSIS — R451 Restlessness and agitation: Secondary | ICD-10-CM | POA: Diagnosis not present

## 2019-04-19 LAB — COMPREHENSIVE METABOLIC PANEL
ALT: 15 U/L (ref 0–44)
AST: 17 U/L (ref 15–41)
Albumin: 3.5 g/dL (ref 3.5–5.0)
Alkaline Phosphatase: 57 U/L (ref 38–126)
Anion gap: 7 (ref 5–15)
BUN: 36 mg/dL — ABNORMAL HIGH (ref 8–23)
CO2: 24 mmol/L (ref 22–32)
Calcium: 8.7 mg/dL — ABNORMAL LOW (ref 8.9–10.3)
Chloride: 110 mmol/L (ref 98–111)
Creatinine, Ser: 2.77 mg/dL — ABNORMAL HIGH (ref 0.61–1.24)
GFR calc Af Amer: 23 mL/min — ABNORMAL LOW (ref >60)
GFR calc non Af Amer: 20 mL/min — ABNORMAL LOW (ref >60)
Glucose, Bld: 115 mg/dL — ABNORMAL HIGH (ref 70–99)
Potassium: 4.4 mmol/L (ref 3.5–5.1)
Sodium: 141 mmol/L (ref 135–145)
Total Bilirubin: 0.5 mg/dL (ref 0.3–1.2)
Total Protein: 6.4 g/dL — ABNORMAL LOW (ref 6.5–8.1)

## 2019-04-19 LAB — URINALYSIS, ROUTINE W REFLEX MICROSCOPIC
Bacteria, UA: NONE SEEN
Bilirubin Urine: NEGATIVE
Glucose, UA: NEGATIVE mg/dL
Hgb urine dipstick: NEGATIVE
Ketones, ur: NEGATIVE mg/dL
Leukocytes,Ua: NEGATIVE
Nitrite: NEGATIVE
Protein, ur: 100 mg/dL — AB
Specific Gravity, Urine: 1.012 (ref 1.005–1.030)
pH: 6 (ref 5.0–8.0)

## 2019-04-19 LAB — RAPID URINE DRUG SCREEN, HOSP PERFORMED
Amphetamines: NOT DETECTED
Barbiturates: NOT DETECTED
Benzodiazepines: NOT DETECTED
Cocaine: NOT DETECTED
Opiates: NOT DETECTED
Tetrahydrocannabinol: NOT DETECTED

## 2019-04-19 LAB — CBC WITH DIFFERENTIAL/PLATELET
Abs Immature Granulocytes: 0.03 10*3/uL (ref 0.00–0.07)
Basophils Absolute: 0 10*3/uL (ref 0.0–0.1)
Basophils Relative: 1 %
Eosinophils Absolute: 0.2 10*3/uL (ref 0.0–0.5)
Eosinophils Relative: 2 %
HCT: 33.3 % — ABNORMAL LOW (ref 39.0–52.0)
Hemoglobin: 10.6 g/dL — ABNORMAL LOW (ref 13.0–17.0)
Immature Granulocytes: 0 %
Lymphocytes Relative: 17 %
Lymphs Abs: 1.5 10*3/uL (ref 0.7–4.0)
MCH: 30.6 pg (ref 26.0–34.0)
MCHC: 31.8 g/dL (ref 30.0–36.0)
MCV: 96.2 fL (ref 80.0–100.0)
Monocytes Absolute: 1 10*3/uL (ref 0.1–1.0)
Monocytes Relative: 11 %
Neutro Abs: 6.1 10*3/uL (ref 1.7–7.7)
Neutrophils Relative %: 69 %
Platelets: 189 10*3/uL (ref 150–400)
RBC: 3.46 MIL/uL — ABNORMAL LOW (ref 4.22–5.81)
RDW: 15.3 % (ref 11.5–15.5)
WBC: 8.7 10*3/uL (ref 4.0–10.5)
nRBC: 0 % (ref 0.0–0.2)

## 2019-04-19 LAB — LIPASE, BLOOD: Lipase: 38 U/L (ref 11–51)

## 2019-04-19 LAB — ACETAMINOPHEN LEVEL: Acetaminophen (Tylenol), Serum: <10 ug/mL — ABNORMAL LOW (ref 10–30)

## 2019-04-19 LAB — CBG MONITORING, ED
Glucose-Capillary: 83 mg/dL (ref 70–99)
Glucose-Capillary: 139 mg/dL — ABNORMAL HIGH (ref 70–99)

## 2019-04-19 LAB — SALICYLATE LEVEL: Salicylate Lvl: <7 mg/dL (ref 2.8–30.0)

## 2019-04-19 LAB — ETHANOL: Alcohol, Ethyl (B): <10 mg/dL (ref <10)

## 2019-04-19 MED ORDER — INSULIN ASPART 100 UNIT/ML ~~LOC~~ SOLN
0.0000 [IU] | SUBCUTANEOUS | Status: DC
  Filled 2019-04-19: qty 0.15

## 2019-04-19 MED ORDER — GABAPENTIN 100 MG PO CAPS
100.0000 mg | ORAL_CAPSULE | Freq: Once | ORAL | Status: AC
  Administered 2019-04-19: 100 mg via ORAL
  Filled 2019-04-19: qty 1

## 2019-04-19 MED ORDER — ASPIRIN 81 MG PO CHEW
81.0000 mg | CHEWABLE_TABLET | Freq: Once | ORAL | Status: AC
  Administered 2019-04-19: 22:00:00 81 mg via ORAL
  Filled 2019-04-19: qty 1

## 2019-04-19 MED ORDER — DIVALPROEX SODIUM 500 MG PO DR TAB
500.0000 mg | DELAYED_RELEASE_TABLET | Freq: Once | ORAL | Status: AC
  Administered 2019-04-19: 22:00:00 500 mg via ORAL
  Filled 2019-04-19: qty 1

## 2019-04-19 MED ORDER — ATORVASTATIN CALCIUM 20 MG PO TABS
20.0000 mg | ORAL_TABLET | Freq: Once | ORAL | Status: AC
  Administered 2019-04-19: 22:00:00 20 mg via ORAL
  Filled 2019-04-19: qty 1

## 2019-04-19 MED ORDER — AMLODIPINE BESYLATE 5 MG PO TABS
10.0000 mg | ORAL_TABLET | Freq: Once | ORAL | Status: AC
  Administered 2019-04-19: 10 mg via ORAL
  Filled 2019-04-19: qty 2

## 2019-04-19 MED ORDER — MIRTAZAPINE 7.5 MG PO TABS
15.0000 mg | ORAL_TABLET | Freq: Once | ORAL | Status: AC
  Administered 2019-04-19: 15 mg via ORAL
  Filled 2019-04-19: qty 2

## 2019-04-19 NOTE — ED Notes (Signed)
Pt to room 29.  Calm, cooperative, no s/s of distress. Denies HI
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
## 2019-04-20 DIAGNOSIS — F0390 Unspecified dementia without behavioral disturbance: Secondary | ICD-10-CM | POA: Diagnosis not present

## 2019-04-20 LAB — CBG MONITORING, ED
Glucose-Capillary: 109 mg/dL — ABNORMAL HIGH (ref 70–99)
Glucose-Capillary: 40 mg/dL — CL (ref 70–99)
Glucose-Capillary: 100 mg/dL — ABNORMAL HIGH (ref 70–99)

## 2019-04-20 NOTE — Progress Notes (Signed)
Received Samuel [name] this PM in his room talking on the phone. He changed into scrubs, scanned, and his personal belongings secured. He made several telephone calls, medication  compliant and drifted off to sleep. He woke up at 0200 hrs and was disorganized to his location. He was calm and reoriented to his location and time. He continued to sleep throughout the morning.

## 2019-04-20 NOTE — ED Notes (Signed)
Called pt's daughter, Brazen Domangue. She will pick pt up after lunch.

## 2019-04-20 NOTE — ED Notes (Signed)
Pt discharged home. Discharged instructions read to pt who verbalized understanding. All belongings returned to pt. Denies SI/HI, is not delusional and not responding to internal stimuli. Escorted pt to the ED exit.   

## 2019-04-20 NOTE — ED Notes (Signed)
CBG this am was 40. Pt was alert and not symptomatic. Pt drank OJ and ate breakfast. Recheck was 104.

## 2019-04-24 ENCOUNTER — Telehealth: Payer: Self-pay | Admitting: Neurology
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
## 2019-04-24 NOTE — Telephone Encounter (Signed)
Returned son's Dietitian) call regarding his father's behavior. Left message for him to return call.  Per chart last office visit with Dr. Delice Lesch was 10/30/18. (Does have follow up appt in October scheduled)  Meds ordered by MD are: Donepezil 10 mg / day ; Gabapentin 100mg  QHS; Mirtazapine 15 mg QHS
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
## 2019-04-25 NOTE — Telephone Encounter (Signed)
Noted, thanks!

## 2019-05-02 DIAGNOSIS — I129 Hypertensive chronic kidney disease with stage 1 through stage 4 chronic kidney disease, or unspecified chronic kidney disease: Secondary | ICD-10-CM | POA: Diagnosis not present

## 2019-05-02 DIAGNOSIS — N2581 Secondary hyperparathyroidism of renal origin: Secondary | ICD-10-CM | POA: Diagnosis not present

## 2019-05-02 DIAGNOSIS — E7800 Pure hypercholesterolemia, unspecified: Secondary | ICD-10-CM | POA: Diagnosis not present

## 2019-05-02 DIAGNOSIS — D631 Anemia in chronic kidney disease: Secondary | ICD-10-CM | POA: Diagnosis not present

## 2019-05-02 DIAGNOSIS — N320 Bladder-neck obstruction: Secondary | ICD-10-CM | POA: Diagnosis not present

## 2019-05-02 DIAGNOSIS — N401 Enlarged prostate with lower urinary tract symptoms: Secondary | ICD-10-CM | POA: Diagnosis not present

## 2019-05-02 DIAGNOSIS — N184 Chronic kidney disease, stage 4 (severe): Secondary | ICD-10-CM | POA: Diagnosis not present

## 2019-05-02 DIAGNOSIS — E1122 Type 2 diabetes mellitus with diabetic chronic kidney disease: Secondary | ICD-10-CM | POA: Diagnosis not present

## 2019-05-02 DIAGNOSIS — F0390 Unspecified dementia without behavioral disturbance: Secondary | ICD-10-CM | POA: Diagnosis not present

## 2019-05-02 DIAGNOSIS — N138 Other obstructive and reflux uropathy: Secondary | ICD-10-CM | POA: Diagnosis not present

## 2019-05-02 DIAGNOSIS — M109 Gout, unspecified: Secondary | ICD-10-CM | POA: Diagnosis not present

## 2019-05-10 DIAGNOSIS — Z961 Presence of intraocular lens: Secondary | ICD-10-CM | POA: Diagnosis not present

## 2019-05-10 DIAGNOSIS — E119 Type 2 diabetes mellitus without complications: Secondary | ICD-10-CM | POA: Diagnosis not present

## 2019-05-10 DIAGNOSIS — H353131 Nonexudative age-related macular degeneration, bilateral, early dry stage: Secondary | ICD-10-CM | POA: Diagnosis not present

## 2019-05-10 DIAGNOSIS — Z794 Long term (current) use of insulin: Secondary | ICD-10-CM | POA: Diagnosis not present

## 2019-06-25 ENCOUNTER — Other Ambulatory Visit: Payer: Self-pay

## 2019-06-25 ENCOUNTER — Encounter: Payer: Self-pay | Admitting: Neurology

## 2019-06-25 ENCOUNTER — Ambulatory Visit (INDEPENDENT_AMBULATORY_CARE_PROVIDER_SITE_OTHER): Payer: Medicare Other | Admitting: Neurology

## 2019-06-25 VITALS — BP 154/64 | HR 85 | Ht 65.0 in | Wt 154.4 lb

## 2019-06-25 DIAGNOSIS — F0391 Unspecified dementia with behavioral disturbance: Secondary | ICD-10-CM | POA: Diagnosis not present

## 2019-06-25 DIAGNOSIS — R252 Cramp and spasm: Secondary | ICD-10-CM

## 2019-06-25 DIAGNOSIS — F03918 Unspecified dementia, unspecified severity, with other behavioral disturbance: Secondary | ICD-10-CM

## 2019-06-25 DIAGNOSIS — F0390 Unspecified dementia without behavioral disturbance: Secondary | ICD-10-CM | POA: Diagnosis not present

## 2019-06-25 DIAGNOSIS — F03A0 Unspecified dementia, mild, without behavioral disturbance, psychotic disturbance, mood disturbance, and anxiety: Secondary | ICD-10-CM

## 2019-06-25 DIAGNOSIS — Z23 Encounter for immunization: Secondary | ICD-10-CM | POA: Diagnosis not present

## 2019-06-25 DIAGNOSIS — F03B18 Unspecified dementia, moderate, with other behavioral disturbance: Secondary | ICD-10-CM

## 2019-06-25 DIAGNOSIS — F32A Depression, unspecified: Secondary | ICD-10-CM

## 2019-06-25 DIAGNOSIS — F329 Major depressive disorder, single episode, unspecified: Secondary | ICD-10-CM | POA: Diagnosis not present

## 2019-06-25 MED ORDER — MEMANTINE HCL 10 MG PO TABS: ORAL_TABLET | ORAL | 11 refills | Status: DC

## 2019-06-25 MED ORDER — MIRTAZAPINE 15 MG PO TABS: ORAL_TABLET | ORAL | 4 refills | Status: DC

## 2019-06-25 MED ORDER — DONEPEZIL HCL 10 MG PO TABS: ORAL_TABLET | ORAL | 3 refills | Status: DC

## 2019-06-25 MED ORDER — GABAPENTIN 100 MG PO CAPS: 100.0000 mg | ORAL_CAPSULE | Freq: Every day | ORAL | 3 refills | Status: DC

## 2019-06-25 NOTE — Patient Instructions (Signed)
1. Start Memantine 10mg : take 1 tablet every night for 2 weeks, then increase to 1 tablet twice a day  2. Continue all your other medications  3. Continue to monitor driving  4. Follow-up in 6 months, call for any changes  FALL PRECAUTIONS: Be cautious when walking. Scan the area for obstacles that may increase the risk of trips and falls. When getting up in the mornings, sit up at the edge of the bed for a few minutes before getting out of bed. Consider elevating the bed at the head end to avoid drop of blood pressure when getting up. Walk always in a well-lit room (use night lights in the walls). Avoid area rugs or power cords from appliances in the middle of the walkways. Use a walker or a cane if necessary and consider physical therapy for balance exercise. Get your eyesight checked regularly.  FINANCIAL OVERSIGHT: Supervision, especially oversight when making financial decisions or transactions is also recommended.  HOME SAFETY: Consider the safety of the kitchen when operating appliances like stoves, microwave oven, and blender. Consider having supervision and share cooking responsibilities until no longer able to participate in those. Accidents with firearms and other hazards in the house should be identified and addressed as well.  DRIVING: Regarding driving, in patients with progressive memory problems, driving will be impaired. We advise to have someone else do the driving if trouble finding directions or if minor accidents are reported. Independent driving assessment is available to determine safety of driving.  ABILITY TO BE LEFT ALONE: If patient is unable to contact 911 operator, consider using LifeLine, or when the need is there, arrange for someone to stay with patients. Smoking is a fire hazard, consider supervision or cessation. Risk of wandering should be assessed by caregiver and if detected at any point, supervision and safe proof recommendations should be  instituted.  MEDICATION SUPERVISION: Inability to self-administer medication needs to be constantly addressed. Implement a mechanism to ensure safe administration of the medications.  RECOMMENDATIONS FOR ALL PATIENTS WITH MEMORY PROBLEMS: 1. Continue to exercise (Recommend 30 minutes of walking everyday, or 3 hours every week) 2. Increase social interactions - continue going to [place] and enjoy social gatherings with friends and family 3. Eat healthy, avoid fried foods and eat more fruits and vegetables 4. Maintain adequate blood pressure, blood sugar, and blood cholesterol level. Reducing the risk of stroke and cardiovascular disease also helps promoting better memory. 5. Avoid stressful situations. Live a simple life and avoid aggravations. Organize your time and prepare for the next day in anticipation. 6. Sleep well, avoid any interruptions of sleep and avoid any distractions in the bedroom that may interfere with adequate sleep quality 7. Avoid sugar, avoid sweets as there is a strong link between excessive sugar intake, diabetes, and cognitive impairment The Mediterranean diet has been shown to help patients reduce the risk of progressive memory disorders and reduces cardiovascular risk. This includes eating fish, eat fruits and green leafy vegetables, nuts like almonds and hazelnuts, walnuts, and also use olive oil. Avoid fast foods and fried foods as much as possible. Avoid sweets and sugar as sugar use has been linked to worsening of memory function.  There is always a concern of gradual progression of memory problems. If this is the case, then we may need to adjust level of care according to patient needs. Support, both to the patient and caregiver, should then be put into place.
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
## 2019-07-04 ENCOUNTER — Encounter: Payer: Self-pay | Admitting: Neurology

## 2019-08-01 ENCOUNTER — Other Ambulatory Visit: Payer: Self-pay | Admitting: Neurology

## 2019-08-01 DIAGNOSIS — F03B18 Unspecified dementia, moderate, with other behavioral disturbance: Secondary | ICD-10-CM

## 2019-08-01 DIAGNOSIS — F0391 Unspecified dementia with behavioral disturbance: Secondary | ICD-10-CM

## 2019-09-25 DIAGNOSIS — N2581 Secondary hyperparathyroidism of renal origin: Secondary | ICD-10-CM | POA: Diagnosis not present

## 2019-09-25 DIAGNOSIS — E119 Type 2 diabetes mellitus without complications: Secondary | ICD-10-CM | POA: Diagnosis not present

## 2019-09-25 DIAGNOSIS — D509 Iron deficiency anemia, unspecified: Secondary | ICD-10-CM | POA: Diagnosis not present

## 2019-09-25 DIAGNOSIS — I129 Hypertensive chronic kidney disease with stage 1 through stage 4 chronic kidney disease, or unspecified chronic kidney disease: Secondary | ICD-10-CM | POA: Diagnosis not present

## 2019-09-25 DIAGNOSIS — N184 Chronic kidney disease, stage 4 (severe): Secondary | ICD-10-CM | POA: Diagnosis not present

## 2019-09-25 DIAGNOSIS — E1122 Type 2 diabetes mellitus with diabetic chronic kidney disease: Secondary | ICD-10-CM | POA: Diagnosis not present

## 2019-09-25 DIAGNOSIS — M109 Gout, unspecified: Secondary | ICD-10-CM | POA: Diagnosis not present

## 2019-09-25 DIAGNOSIS — N2889 Other specified disorders of kidney and ureter: Secondary | ICD-10-CM | POA: Diagnosis not present

## 2019-09-26 DIAGNOSIS — I129 Hypertensive chronic kidney disease with stage 1 through stage 4 chronic kidney disease, or unspecified chronic kidney disease: Secondary | ICD-10-CM | POA: Diagnosis not present

## 2019-09-26 DIAGNOSIS — N401 Enlarged prostate with lower urinary tract symptoms: Secondary | ICD-10-CM | POA: Diagnosis not present

## 2019-09-26 DIAGNOSIS — N184 Chronic kidney disease, stage 4 (severe): Secondary | ICD-10-CM | POA: Diagnosis not present

## 2019-09-26 DIAGNOSIS — R413 Other amnesia: Secondary | ICD-10-CM | POA: Diagnosis not present

## 2019-09-26 DIAGNOSIS — Z794 Long term (current) use of insulin: Secondary | ICD-10-CM | POA: Diagnosis not present

## 2019-09-26 DIAGNOSIS — N2581 Secondary hyperparathyroidism of renal origin: Secondary | ICD-10-CM | POA: Diagnosis not present

## 2019-09-26 DIAGNOSIS — E7800 Pure hypercholesterolemia, unspecified: Secondary | ICD-10-CM | POA: Diagnosis not present

## 2019-09-26 DIAGNOSIS — M109 Gout, unspecified: Secondary | ICD-10-CM | POA: Diagnosis not present

## 2019-09-26 DIAGNOSIS — D631 Anemia in chronic kidney disease: Secondary | ICD-10-CM | POA: Diagnosis not present

## 2019-09-26 DIAGNOSIS — M545 Low back pain: Secondary | ICD-10-CM | POA: Diagnosis not present

## 2019-09-26 DIAGNOSIS — E1129 Type 2 diabetes mellitus with other diabetic kidney complication: Secondary | ICD-10-CM | POA: Diagnosis not present

## 2019-09-26 DIAGNOSIS — E11319 Type 2 diabetes mellitus with unspecified diabetic retinopathy without macular edema: Secondary | ICD-10-CM | POA: Diagnosis not present

## 2019-10-12 ENCOUNTER — Other Ambulatory Visit: Payer: Self-pay | Admitting: Neurology

## 2019-10-12 DIAGNOSIS — F03B18 Unspecified dementia, moderate, with other behavioral disturbance: Secondary | ICD-10-CM

## 2019-10-12 DIAGNOSIS — F0391 Unspecified dementia with behavioral disturbance: Secondary | ICD-10-CM

## 2019-10-16 ENCOUNTER — Ambulatory Visit: Payer: Medicare Other

## 2019-10-18 ENCOUNTER — Ambulatory Visit: Payer: Medicare Other

## 2019-10-29 ENCOUNTER — Ambulatory Visit: Payer: Medicare Other

## 2019-11-04 ENCOUNTER — Other Ambulatory Visit: Payer: Self-pay | Admitting: Neurology

## 2019-11-04 DIAGNOSIS — F0391 Unspecified dementia with behavioral disturbance: Secondary | ICD-10-CM

## 2019-11-04 DIAGNOSIS — F03B18 Unspecified dementia, moderate, with other behavioral disturbance: Secondary | ICD-10-CM

## 2020-01-25 ENCOUNTER — Ambulatory Visit: Payer: Medicare Other | Admitting: Neurology

## 2020-03-26 DIAGNOSIS — M109 Gout, unspecified: Secondary | ICD-10-CM | POA: Diagnosis not present

## 2020-03-26 DIAGNOSIS — Z125 Encounter for screening for malignant neoplasm of prostate: Secondary | ICD-10-CM | POA: Diagnosis not present

## 2020-03-26 DIAGNOSIS — E1129 Type 2 diabetes mellitus with other diabetic kidney complication: Secondary | ICD-10-CM | POA: Diagnosis not present

## 2020-03-26 DIAGNOSIS — E7800 Pure hypercholesterolemia, unspecified: Secondary | ICD-10-CM | POA: Diagnosis not present

## 2020-04-01 DIAGNOSIS — N2581 Secondary hyperparathyroidism of renal origin: Secondary | ICD-10-CM | POA: Diagnosis not present

## 2020-04-01 DIAGNOSIS — I129 Hypertensive chronic kidney disease with stage 1 through stage 4 chronic kidney disease, or unspecified chronic kidney disease: Secondary | ICD-10-CM | POA: Diagnosis not present

## 2020-04-01 DIAGNOSIS — N401 Enlarged prostate with lower urinary tract symptoms: Secondary | ICD-10-CM | POA: Diagnosis not present

## 2020-04-01 DIAGNOSIS — N320 Bladder-neck obstruction: Secondary | ICD-10-CM | POA: Diagnosis not present

## 2020-04-01 DIAGNOSIS — Z0000 Encounter for general adult medical examination without abnormal findings: Secondary | ICD-10-CM | POA: Diagnosis not present

## 2020-04-01 DIAGNOSIS — Z1339 Encounter for screening examination for other mental health and behavioral disorders: Secondary | ICD-10-CM | POA: Diagnosis not present

## 2020-04-01 DIAGNOSIS — N184 Chronic kidney disease, stage 4 (severe): Secondary | ICD-10-CM | POA: Diagnosis not present

## 2020-04-01 DIAGNOSIS — Z794 Long term (current) use of insulin: Secondary | ICD-10-CM | POA: Diagnosis not present

## 2020-04-01 DIAGNOSIS — K219 Gastro-esophageal reflux disease without esophagitis: Secondary | ICD-10-CM | POA: Diagnosis not present

## 2020-04-01 DIAGNOSIS — E11319 Type 2 diabetes mellitus with unspecified diabetic retinopathy without macular edema: Secondary | ICD-10-CM | POA: Diagnosis not present

## 2020-04-01 DIAGNOSIS — M1990 Unspecified osteoarthritis, unspecified site: Secondary | ICD-10-CM | POA: Diagnosis not present

## 2020-04-01 DIAGNOSIS — E7800 Pure hypercholesterolemia, unspecified: Secondary | ICD-10-CM | POA: Diagnosis not present

## 2020-04-01 DIAGNOSIS — Z1331 Encounter for screening for depression: Secondary | ICD-10-CM | POA: Diagnosis not present

## 2020-04-01 DIAGNOSIS — R82998 Other abnormal findings in urine: Secondary | ICD-10-CM | POA: Diagnosis not present

## 2020-04-01 DIAGNOSIS — E1129 Type 2 diabetes mellitus with other diabetic kidney complication: Secondary | ICD-10-CM | POA: Diagnosis not present

## 2020-04-28 ENCOUNTER — Ambulatory Visit: Payer: Medicare Other | Admitting: Neurology

## 2020-05-09 ENCOUNTER — Other Ambulatory Visit: Payer: Self-pay

## 2020-05-09 ENCOUNTER — Ambulatory Visit
Admission: EM | Admit: 2020-05-09 | Discharge: 2020-05-09 | Disposition: A | Discharge status: Home / Self Care | Payer: Medicare Other | Attending: Emergency Medicine | Admitting: Emergency Medicine

## 2020-05-09 DIAGNOSIS — R0989 Other specified symptoms and signs involving the circulatory and respiratory systems: Secondary | ICD-10-CM

## 2020-05-09 DIAGNOSIS — Z1152 Encounter for screening for COVID-19: Secondary | ICD-10-CM | POA: Diagnosis not present

## 2020-05-09 MED ORDER — FLUTICASONE PROPIONATE 50 MCG/ACT NA SUSP: 2.0000 | Freq: Every day | NASAL | 0 refills | Status: DC

## 2020-05-09 NOTE — ED Triage Notes (Signed)
According to CG pt has had nasal congestion and cough since 2nd week in august

## 2020-05-09 NOTE — Discharge Instructions (Signed)
COVID testing ordered.  It will take between 5-7 days for test results.  Someone will contact you regarding abnormal results.    In the meantime: You should remain isolated in your home for 10 days from symptom onset AND greater than 72 hours after symptoms resolution (absence of fever without the use of fever-reducing medication and improvement in respiratory symptoms), whichever is longer Get plenty of rest and push fluids Use OTC zyrtec for nasal congestion, runny nose, and/or sore throat Prescribed flonase for nasal congestion and runny nose Use medications daily for symptom relief Use OTC medications like ibuprofen or tylenol as needed fever or pain Call or go to the ED if you have any new or worsening symptoms such as fever, cough, shortness of breath, chest tightness, chest pain, turning blue, changes in mental status, etc..[name]

## 2020-05-09 NOTE — ED Provider Notes (Signed)
[place]   [id number] 05/09/20 Arrival Time: 1212   CC: COVID symptoms  SUBJECTIVE: History from: patient.  Samuel [name] is a 86 y.o. male who presents with runny nose, congestion, fatigue, and subjective fever x 3-4 weeks.  Denies sick exposure to COVID, flu or strep.  Denies alleviating or aggravating factors.  Reports previous symptoms in the past.   Denies sinus pain, sore throat, cough, SOB, wheezing, chest pain, nausea, changes in bowel or bladder habits.     ROS: As per HPI.  All other pertinent ROS negative.     Past Medical History:  Diagnosis Date  . BPH (benign prostatic hyperplasia)   . Bruise    LEFT BACK/SHOULDER AREA  . Chronic kidney disease (CKD), stage III (moderate) BASE CRE  1.89  -- 2.2   NEPHROLOGIST-- [name]--  LOV DEC 2013  W/ CHART  . Diabetes mellitus    PCP--  [name]  . History of kidney stones   . Hypercholesteremia   . Hyperparathyroidism, secondary ([place])   . Hypertension   . Iron deficiency anemia   . Left shoulder strain    PT FELL ON ICE 11-06-2012   Past Surgical History:  Procedure Laterality Date  . BLEPHAROPLASTY Bilateral 04-02-2005  . CYSTO/ LEFT URETERAL STENT PLACEMENT  02-07-2003  . CYSTO/ RIGHT URETER BALLOON DILATION/ STONE EXTRACTION  02-20-2003  . GREEN LIGHT LASER TURP (TRANSURETHRAL RESECTION OF PROSTATE N/A 11/24/2012   Procedure: GREEN LIGHT LASER TURP (TRANSURETHRAL RESECTION OF PROSTATE;  Surgeon: [name], MD;  Location[facility];  Service: Urology;  Laterality: N/A;  . LEFT URETEROSCOPIC STONE EXTRACTION  02-14-2003   Allergies  Allergen Reactions  . Codeine Itching  . Penicillins Itching  . Sulfa Antibiotics    No current facility-administered medications on file prior to encounter.   Current Outpatient Medications on File Prior to Encounter  Medication Sig Dispense Refill  . allopurinol (ZYLOPRIM) 300 MG tablet Take 300 mg by mouth daily.    [name]  amLODipine (NORVASC) 10 MG tablet Take 10 mg by mouth every evening.     [name] aspirin EC 81 MG tablet Take 81 mg by mouth every evening.     [name] atorvastatin (LIPITOR) 20 MG tablet Take 20 mg by mouth every evening.     . calcitRIOL (ROCALTROL) 0.25 MCG capsule Take 0.25 mcg by mouth daily.     . divalproex (DEPAKOTE) 500 MG DR tablet Take 500 mg by mouth every evening.    . donepezil (ARICEPT) 10 MG tablet Take 1 tablet daily 90 tablet 3  . furosemide (LASIX) 20 MG tablet Take 20 mg by mouth every morning.     . gabapentin (NEURONTIN) 100 MG capsule Take 1 capsule (100 mg total) by mouth at bedtime. 90 capsule 3  . glimepiride (AMARYL) 4 MG tablet Take 4 mg by mouth 2 (two) times daily.     . insulin glargine (LANTUS SOLOSTAR) 100 UNIT/ML injection Inject 12-18 Units into the skin 2 (two) times daily.     . memantine (NAMENDA) 10 MG tablet TAKE 1 TABLET EVERY NIGHT FOR 2 WEEKS, THEN INCREASE TO 1 TABLET TWICE A DAY 360 tablet 3  . mirtazapine (REMERON) 15 MG tablet TAKE 1 TABLET BY MOUTH EVERYDAY AT BEDTIME 90 tablet 4  . sitaGLIPtin (JANUVIA) 50 MG tablet Take 50 mg by mouth every morning.     . tamsulosin (FLOMAX) 0.4 MG CAPS Take 0.4 mg by mouth every morning.    [name]  vitamin B-12 (CYANOCOBALAMIN) 1000 MCG tablet Take 1,000 mcg by mouth every morning.      Social History   Socioeconomic History  . Marital status: Married    Spouse name: Not on file  . Number of children: Not on file  . Years of education: Not on file  . Highest education level: Not on file  Occupational History  . Not on file  Tobacco Use  . Smoking status: Former Smoker    Packs/day: 4.00    Years: 40.00    Pack years: 160.00    Types: Cigarettes    Quit date: 11/17/1992    Years since quitting: 27.4  . Smokeless tobacco: Never Used  Vaping Use  . Vaping Use: Never used  Substance and Sexual Activity  . Alcohol use: No  . Drug use: No  . Sexual activity: Not Currently    Partners: Female  Other Topics Concern    . Not on file  Social History Narrative   Lives with wife in a one story home.  Has 4 children.  Retired Psychologist, sport and exercise.     Social Determinants of Health   Financial Resource Strain:   . Difficulty of Paying Living Expenses: Not on file  Food Insecurity:   . Worried About Charity fundraiser in the Last Year: Not on file  . Ran Out of Food in the Last Year: Not on file  Transportation Needs:   . Lack of Transportation (Medical): Not on file  . Lack of Transportation (Non-Medical): Not on file  Physical Activity:   . Days of Exercise per Week: Not on file  . Minutes of Exercise per Session: Not on file  Stress:   . Feeling of Stress : Not on file  Social Connections:   . Frequency of Communication with Friends and Family: Not on file  . Frequency of Social Gatherings with Friends and Family: Not on file  . Attends Religious Services: Not on file  . Active Member of Clubs or Organizations: Not on file  . Attends Archivist Meetings: Not on file  . Marital Status: Not on file  Intimate Partner Violence:   . Fear of Current or Ex-Partner: Not on file  . Emotionally Abused: Not on file  . Physically Abused: Not on file  . Sexually Abused: Not on file   History reviewed. No pertinent family history.  OBJECTIVE:  Vitals:   05/09/20 1301  BP: 125/69  Pulse: 88  Resp: 18  Temp: 98.2 F (36.8 C)  SpO2: 96%    General appearance: alert; mildly fatigued appearing, nontoxic; speaking in full sentences and tolerating own secretions HEENT: NCAT; Ears: EACs clear, TMs pearly gray; Eyes: PERRL.  EOM grossly intact.Nose: nares patent without rhinorrhea, Throat: oropharynx clear, tonsils non erythematous or enlarged, uvula midline  Neck: supple without LAD Lungs: unlabored respirations, symmetrical air entry; cough: absent; no respiratory distress; CTAB Heart: regular rate and rhythm.  Skin: warm and dry Psychological: alert and cooperative; normal mood and affect  ASSESSMENT &  PLAN:  1. Encounter for screening for COVID-19   2. Runny nose     Meds ordered this encounter  Medications  . fluticasone (FLONASE) 50 MCG/ACT nasal spray    Sig: Place 2 sprays into both nostrils daily.    Dispense:  16 g    Refill:  0    Order Specific Question:   Supervising Provider    Answer:   Raylene Everts [[id number]]   COVID testing ordered.  It will take between 5-7 days for test results.  Someone will contact you regarding abnormal results.    In the meantime: You should remain isolated in your home for 10 days from symptom onset AND greater than 72 hours after symptoms resolution (absence of fever without the use of fever-reducing medication and improvement in respiratory symptoms), whichever is longer Get plenty of rest and push fluids Use OTC zyrtec for nasal congestion, runny nose, and/or sore throat Prescribed flonase for nasal congestion and runny nose Use medications daily for symptom relief Use OTC medications like ibuprofen or tylenol as needed fever or pain Call or go to the ED if you have any new or worsening symptoms such as fever, cough, shortness of breath, chest tightness, chest pain, turning blue, changes in mental status, etc...   Reviewed expectations re: course of current medical issues. Questions answered. Outlined signs and symptoms indicating need for more acute intervention. Patient verbalized understanding. After Visit Summary given.         Lestine Box, PA-C 05/09/20 1334

## 2020-05-10 LAB — NOVEL CORONAVIRUS, NAA: SARS-CoV-2, NAA: NOT DETECTED

## 2020-06-09 DIAGNOSIS — Z23 Encounter for immunization: Secondary | ICD-10-CM | POA: Diagnosis not present

## 2020-06-24 DIAGNOSIS — Z23 Encounter for immunization: Secondary | ICD-10-CM | POA: Diagnosis not present

## 2020-08-22 ENCOUNTER — Other Ambulatory Visit: Payer: Self-pay | Admitting: Neurology

## 2020-08-22 DIAGNOSIS — R252 Cramp and spasm: Secondary | ICD-10-CM

## 2020-08-25 ENCOUNTER — Other Ambulatory Visit: Payer: Self-pay

## 2020-08-25 ENCOUNTER — Telehealth: Payer: Self-pay | Admitting: Neurology

## 2020-08-25 DIAGNOSIS — R252 Cramp and spasm: Secondary | ICD-10-CM

## 2020-08-25 MED ORDER — GABAPENTIN 100 MG PO CAPS: 100.0000 mg | ORAL_CAPSULE | Freq: Every day | ORAL | 3 refills | Status: DC

## 2020-08-25 NOTE — Telephone Encounter (Signed)
Patient's wife called in and needs a refill of the patient's gabapentin refilled. He will be out on Wednesday. She scheduled a follow up appointment for 04/20/21.

## 2020-08-26 ENCOUNTER — Other Ambulatory Visit: Payer: Self-pay | Admitting: Neurology

## 2020-08-26 DIAGNOSIS — F03B18 Unspecified dementia, moderate, with other behavioral disturbance: Secondary | ICD-10-CM

## 2020-09-18 ENCOUNTER — Other Ambulatory Visit: Payer: Self-pay | Admitting: Neurology

## 2020-09-18 DIAGNOSIS — F32A Depression, unspecified: Secondary | ICD-10-CM

## 2020-09-18 DIAGNOSIS — F0391 Unspecified dementia with behavioral disturbance: Secondary | ICD-10-CM

## 2020-09-18 DIAGNOSIS — F03918 Unspecified dementia, unspecified severity, with other behavioral disturbance: Secondary | ICD-10-CM

## 2020-09-29 DIAGNOSIS — M109 Gout, unspecified: Secondary | ICD-10-CM | POA: Diagnosis not present

## 2020-09-29 DIAGNOSIS — D692 Other nonthrombocytopenic purpura: Secondary | ICD-10-CM | POA: Diagnosis not present

## 2020-09-29 DIAGNOSIS — E11319 Type 2 diabetes mellitus with unspecified diabetic retinopathy without macular edema: Secondary | ICD-10-CM | POA: Diagnosis not present

## 2020-09-29 DIAGNOSIS — M1990 Unspecified osteoarthritis, unspecified site: Secondary | ICD-10-CM | POA: Diagnosis not present

## 2020-09-29 DIAGNOSIS — H6123 Impacted cerumen, bilateral: Secondary | ICD-10-CM | POA: Diagnosis not present

## 2020-09-29 DIAGNOSIS — N2581 Secondary hyperparathyroidism of renal origin: Secondary | ICD-10-CM | POA: Diagnosis not present

## 2020-09-29 DIAGNOSIS — G309 Alzheimer's disease, unspecified: Secondary | ICD-10-CM | POA: Diagnosis not present

## 2020-09-29 DIAGNOSIS — D631 Anemia in chronic kidney disease: Secondary | ICD-10-CM | POA: Diagnosis not present

## 2020-09-29 DIAGNOSIS — Z794 Long term (current) use of insulin: Secondary | ICD-10-CM | POA: Diagnosis not present

## 2020-09-29 DIAGNOSIS — I129 Hypertensive chronic kidney disease with stage 1 through stage 4 chronic kidney disease, or unspecified chronic kidney disease: Secondary | ICD-10-CM | POA: Diagnosis not present

## 2020-09-29 DIAGNOSIS — N184 Chronic kidney disease, stage 4 (severe): Secondary | ICD-10-CM | POA: Diagnosis not present

## 2020-09-29 DIAGNOSIS — E7800 Pure hypercholesterolemia, unspecified: Secondary | ICD-10-CM | POA: Diagnosis not present

## 2020-09-29 DIAGNOSIS — E1129 Type 2 diabetes mellitus with other diabetic kidney complication: Secondary | ICD-10-CM | POA: Diagnosis not present

## 2020-11-11 ENCOUNTER — Other Ambulatory Visit: Payer: Self-pay | Admitting: Neurology

## 2020-11-11 ENCOUNTER — Other Ambulatory Visit: Payer: Self-pay

## 2020-11-11 DIAGNOSIS — F03B18 Unspecified dementia, moderate, with other behavioral disturbance: Secondary | ICD-10-CM

## 2020-11-11 DIAGNOSIS — F0391 Unspecified dementia with behavioral disturbance: Secondary | ICD-10-CM

## 2020-11-11 MED ORDER — MEMANTINE HCL 10 MG PO TABS: ORAL_TABLET | ORAL | 4 refills | Status: DC

## 2020-11-11 NOTE — Progress Notes (Signed)
Per Dr.Aquino okay to ger refills to last until his f/u visit in Aug.

## 2020-12-04 ENCOUNTER — Other Ambulatory Visit: Payer: Self-pay | Admitting: Neurology

## 2020-12-04 DIAGNOSIS — F0391 Unspecified dementia with behavioral disturbance: Secondary | ICD-10-CM

## 2020-12-04 DIAGNOSIS — F03B18 Unspecified dementia, moderate, with other behavioral disturbance: Secondary | ICD-10-CM

## 2021-01-30 DIAGNOSIS — Z23 Encounter for immunization: Secondary | ICD-10-CM | POA: Diagnosis not present

## 2021-03-30 DIAGNOSIS — Z125 Encounter for screening for malignant neoplasm of prostate: Secondary | ICD-10-CM | POA: Diagnosis not present

## 2021-03-30 DIAGNOSIS — E7800 Pure hypercholesterolemia, unspecified: Secondary | ICD-10-CM | POA: Diagnosis not present

## 2021-03-30 DIAGNOSIS — N184 Chronic kidney disease, stage 4 (severe): Secondary | ICD-10-CM | POA: Diagnosis not present

## 2021-03-30 DIAGNOSIS — E1129 Type 2 diabetes mellitus with other diabetic kidney complication: Secondary | ICD-10-CM | POA: Diagnosis not present

## 2021-04-06 DIAGNOSIS — N184 Chronic kidney disease, stage 4 (severe): Secondary | ICD-10-CM | POA: Diagnosis not present

## 2021-04-06 DIAGNOSIS — D631 Anemia in chronic kidney disease: Secondary | ICD-10-CM | POA: Diagnosis not present

## 2021-04-06 DIAGNOSIS — I129 Hypertensive chronic kidney disease with stage 1 through stage 4 chronic kidney disease, or unspecified chronic kidney disease: Secondary | ICD-10-CM | POA: Diagnosis not present

## 2021-04-06 DIAGNOSIS — E7800 Pure hypercholesterolemia, unspecified: Secondary | ICD-10-CM | POA: Diagnosis not present

## 2021-04-06 DIAGNOSIS — Z0000 Encounter for general adult medical examination without abnormal findings: Secondary | ICD-10-CM | POA: Diagnosis not present

## 2021-04-06 DIAGNOSIS — E11319 Type 2 diabetes mellitus with unspecified diabetic retinopathy without macular edema: Secondary | ICD-10-CM | POA: Diagnosis not present

## 2021-04-06 DIAGNOSIS — M109 Gout, unspecified: Secondary | ICD-10-CM | POA: Diagnosis not present

## 2021-04-06 DIAGNOSIS — D692 Other nonthrombocytopenic purpura: Secondary | ICD-10-CM | POA: Diagnosis not present

## 2021-04-06 DIAGNOSIS — E1129 Type 2 diabetes mellitus with other diabetic kidney complication: Secondary | ICD-10-CM | POA: Diagnosis not present

## 2021-04-06 DIAGNOSIS — Z794 Long term (current) use of insulin: Secondary | ICD-10-CM | POA: Diagnosis not present

## 2021-04-06 DIAGNOSIS — R82998 Other abnormal findings in urine: Secondary | ICD-10-CM | POA: Diagnosis not present

## 2021-04-06 DIAGNOSIS — Z1331 Encounter for screening for depression: Secondary | ICD-10-CM | POA: Diagnosis not present

## 2021-04-06 DIAGNOSIS — G309 Alzheimer's disease, unspecified: Secondary | ICD-10-CM | POA: Diagnosis not present

## 2021-04-14 ENCOUNTER — Ambulatory Visit (INDEPENDENT_AMBULATORY_CARE_PROVIDER_SITE_OTHER): Payer: Medicare Other | Admitting: Physician Assistant

## 2021-04-14 ENCOUNTER — Other Ambulatory Visit: Payer: Self-pay

## 2021-04-14 ENCOUNTER — Ambulatory Visit: Payer: Medicare Other | Admitting: Neurology

## 2021-04-14 ENCOUNTER — Encounter: Payer: Self-pay | Admitting: Physician Assistant

## 2021-04-14 DIAGNOSIS — F32A Depression, unspecified: Secondary | ICD-10-CM | POA: Diagnosis not present

## 2021-04-14 DIAGNOSIS — F03B18 Unspecified dementia, moderate, with other behavioral disturbance: Secondary | ICD-10-CM

## 2021-04-14 DIAGNOSIS — R252 Cramp and spasm: Secondary | ICD-10-CM | POA: Diagnosis not present

## 2021-04-14 DIAGNOSIS — F03918 Unspecified dementia, unspecified severity, with other behavioral disturbance: Secondary | ICD-10-CM

## 2021-04-14 DIAGNOSIS — F0391 Unspecified dementia with behavioral disturbance: Secondary | ICD-10-CM | POA: Diagnosis not present

## 2021-04-14 MED ORDER — GABAPENTIN 100 MG PO CAPS: 100.0000 mg | ORAL_CAPSULE | Freq: Every day | ORAL | 3 refills | Status: AC

## 2021-04-14 MED ORDER — MEMANTINE HCL 10 MG PO TABS: ORAL_TABLET | ORAL | 2 refills | Status: AC

## 2021-04-14 MED ORDER — MIRTAZAPINE 15 MG PO TABS: ORAL_TABLET | ORAL | 3 refills | Status: AC

## 2021-04-14 MED ORDER — DONEPEZIL HCL 10 MG PO TABS: ORAL_TABLET | ORAL | 3 refills | Status: AC

## 2021-04-14 NOTE — Progress Notes (Signed)
Assessment/Plan:    Moderate Dementia with Behavioral Disturbance  MMSE today is 15/30, but appears to be affected by decreased hearing. Patient appears clinically stable. He is on Aricept 10 mg nightly, and on memantine 10 mg twice daily, tolerating it well.  For his mood, he is tolerating mirtazapine 15 mg nightly.  He is also on gabapentin 100 mg nightly for muscle cramps, with good response   Recommendations:  Discussed safety both in and out of the home.  Discussed the importance of regular daily schedule with inclusion of crossword puzzles to maintain brain function.  Continue to monitor mood  Stay active at least 30 minutes at least 3 times a week.  Naps should be scheduled and should be no longer than 60 minutes and should not occur after 2 PM.  Continue donepezil 10 mg daily Side effects were discussed Continue Memantine 10 mg twice daily.Side effects were discussed  Continue  mirtazapine 15 mg qhs for behavioral symptoms  Continue gabapentin 100 mg nightly for muscle cramps Monitor driving closely, recommend always a copilot with him Follow up in  6 months.   Case discussed with Dr. Delice Lesch who agrees with the plan    Subjective:   ED visits since last seen: none  Hospital admissions: none  Samuel [name] is a 87 y.o.  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 This patient is accompanied in the office by his son who supplements the history.  Previous records as well as any outside records available were reviewed prior to todays visit.  he  is currently on Memantine 10 mg twice daily and Donepezil  10 mg daily . He was last seen on 06/25/2019.  Since that time, he appears to be doing well.  He continues to sleep a lot, is harder to getting up in the morning.  During the day, he likes taking the  car to drive with a copilot to look at his forearms, or to go to downtown [place].  He only takes the roads that he knows, he does not venture in [place].  His mood is good.  He denies any paranoia or hallucinations.  He is now having a nurse coming 3 times a week to help him with the medications and placing them in the pillbox, making sure that he is well.  His wife manages the finances.  He denies any urine incontinence, but he does have some fecal incontinence, and he soils his pants, because he refuses to wear diapers.  He likes to wear the same clothes repeatedly, but he began to base more frequently, at least 3 or 4 times a week.  He is on low-dose gabapentin 100 mg nightly for muscle cramps, which helped.  He denies any headaches, dizziness, vision changes, focal numbness or tingling, or falls.  No weakness is reported.       History on Initial Assessment 07/22/2016: This is an 87 yo RH man with a history of diabetes, hyperlipidemia, hypertension, hyperparathyroidism, with worsening memory. His wife started noticing memory changes around 5 years ago and noticed it has been progressively worsening. He would ask what day it is, repeats himself, she has to remind him about his medications. He misplaces things frequently. He does recognize that he cannot remember names and "several other things, but I cannot remember what." His wife is in charge of bill payments. They  have house help for several hours 5 days a week. No difficulties with ADLs. He denies getting lost driving. He notices occasional word-finding difficulties. He has called his son to fix the TV when it gets off channel. His wife has noticed he is more irritable and has occasional anger problems.   His mother had dementia at 44. His father had a cerebral aneurysm. He denies any significant head injuries. He stopped alcohol in the 1970s.   Diagnostic Data: Head CT in 01/2017 showed mild diffuse volume loss, no acute changes, extensive  intracranial calcified atherosclerosis   PREVIOUS MEDICATIONS:   CURRENT MEDICATIONS:  Outpatient Encounter Medications as of 04/14/2021  Medication Sig   allopurinol (ZYLOPRIM) 300 MG tablet Take 300 mg by mouth daily.   amLODipine (NORVASC) 10 MG tablet Take 10 mg by mouth every evening.    aspirin EC 81 MG tablet Take 81 mg by mouth every evening.    atorvastatin (LIPITOR) 20 MG tablet Take 20 mg by mouth every evening.    calcitRIOL (ROCALTROL) 0.25 MCG capsule Take 0.25 mcg by mouth daily.    divalproex (DEPAKOTE) 500 MG DR tablet Take 500 mg by mouth every evening.   fluticasone (FLONASE) 50 MCG/ACT nasal spray Place 2 sprays into both nostrils daily.   furosemide (LASIX) 20 MG tablet Take 20 mg by mouth every morning.    glimepiride (AMARYL) 4 MG tablet Take 4 mg by mouth daily with breakfast.   sitaGLIPtin (JANUVIA) 50 MG tablet Take 50 mg by mouth every morning.    tamsulosin (FLOMAX) 0.4 MG CAPS Take 0.4 mg by mouth every morning.   vitamin B-12 (CYANOCOBALAMIN) 1000 MCG tablet Take 1,000 mcg by mouth every morning.    [DISCONTINUED] donepezil (ARICEPT) 10 MG tablet TAKE 1 TABLET BY MOUTH EVERY DAY   [DISCONTINUED] gabapentin (NEURONTIN) 100 MG capsule Take 1 capsule (100 mg total) by mouth at bedtime.   [DISCONTINUED] memantine (NAMENDA) 10 MG tablet TAKE 1 TABLET BY MOUTH TWICE A DAY   [DISCONTINUED] mirtazapine (REMERON) 15 MG tablet TAKE 1 TABLET BY MOUTH EVERYDAY AT BEDTIME   donepezil (ARICEPT) 10 MG tablet TAKE 1 TABLET BY MOUTH EVERY DAY   gabapentin (NEURONTIN) 100 MG capsule Take 1 capsule (100 mg total) by mouth at bedtime.   insulin glargine (LANTUS) 100 UNIT/ML injection Inject 12-18 Units into the skin 2 (two) times daily.  (Patient not taking: Reported on 04/14/2021)   memantine (NAMENDA) 10 MG tablet TAKE 1 TABLET BY MOUTH TWICE A DAY   mirtazapine (REMERON) 15 MG tablet Take 1 tab at night   No facility-administered encounter medications on file as of 04/14/2021.      Objective:     PHYSICAL EXAMINATION:    VITALS:   Vitals:   04/14/21 1416  BP: 130/80  Resp: 18  Weight: 147 lb (66.7 kg)  Height: 5\' 4"  (1.626 m)    GEN:  The patient appears stated age and is in NAD. HEENT:  Normocephalic, atraumatic.   Neurological examination:  General: NAD, well-groomed, appears stated age. Orientation: The patient is alert. Oriented to person, place and date except year Cranial nerves: There is good facial symmetry.The speech is fluent and clear. MAy be hard of hearing . No aphasia or dysarthria. Fund of knowledge is reduced. Recent and remote   memory are impaired. Attention and concentration are reduced.  Able to name objects and repeat phrases.  Hearing is intact to conversational tone.    Sensation: Sensation is intact to light touch  throughout Motor: Strength is at least antigravity x4. Tremors: none  DTR's 2/4 in UE/LE    No flowsheet data found. MMSE - Mini Mental State Exam 04/14/2021 10/30/2018 09/07/2017  Orientation to time 3 2 4   Orientation to Place 4 3 4   Registration 3 3 3   Attention/ Calculation 0 5 5  Recall 0 0 0  Language- name 2 objects 2 2 2   Language- repeat 1 1 1   Language- follow 3 step command 1 3 3   Language- read & follow direction 1 1 1   Write a sentence 0 1 1  Copy design 0 0 0  Total score 15 21 24     [employer] Mental Exam 06/25/2019  Weekday Correct 1  Current year 0  What state are we in? 1  Amount spent 1  Amount left 2  # of Animals 1  5 objects recall 0  Number series 0  Hour markers 0  Time correct 0  Placed X in triangle correctly 1  Largest Figure 1  Name of male 2  Date back to work 2  Type of work 1  State she lived in 0  Total score 13      Movement examination: Tone: There is normal tone in the UE/LE Abnormal movements:  no tremor.  No myoclonus.  No asterixis.   Coordination:  There is no decremation with RAM's. Normal finger to nose  Gait and Station: The patient has no  difficulty arising out of a deep-seated chair without the use of the hands. The patient's stride length is good.  Gait is cautious and narrow.        Total time spent on today's visit was 30 minutes, including both face-to-face time and nonface-to-face time. Time included that spent on review of records (prior notes available to me/labs/imaging if pertinent), discussing treatment and goals, answering patient's questions and coordinating care.  Cc:  Tisovec, Fransico Him, MD Sharene Butters, PA-C

## 2021-04-14 NOTE — Patient Instructions (Signed)
Continue Memantine 10mg : take 1 tablet  twice a day and Aricept 10 mg nightly   2. Continue all your other medications  3. Continue to monitor driving  4. Follow-up in 6 months, call for any changes  FALL PRECAUTIONS: Be cautious when walking. Scan the area for obstacles that may increase the risk of trips and falls. When getting up in the mornings, sit up at the edge of the bed for a few minutes before getting out of bed. Consider elevating the bed at the head end to avoid drop of blood pressure when getting up. Walk always in a well-lit room (use night lights in the walls). Avoid area rugs or power cords from appliances in the middle of the walkways. Use a walker or a cane if necessary and consider physical therapy for balance exercise. Get your eyesight checked regularly.  FINANCIAL OVERSIGHT: Supervision, especially oversight when making financial decisions or transactions is also recommended.  HOME SAFETY: Consider the safety of the kitchen when operating appliances like stoves, microwave oven, and blender. Consider having supervision and share cooking responsibilities until no longer able to participate in those. Accidents with firearms and other hazards in the house should be identified and addressed as well.  DRIVING: Regarding driving, in patients with progressive memory problems, driving will be impaired. We advise to have someone else do the driving if trouble finding directions or if minor accidents are reported. Independent driving assessment is available to determine safety of driving.  ABILITY TO BE LEFT ALONE: If patient is unable to contact 911 operator, consider using LifeLine, or when the need is there, arrange for someone to stay with patients. Smoking is a fire hazard, consider supervision or cessation. Risk of wandering should be assessed by caregiver and if detected at any point, supervision and safe proof recommendations should be instituted.  MEDICATION SUPERVISION:  Inability to self-administer medication needs to be constantly addressed. Implement a mechanism to ensure safe administration of the medications.  RECOMMENDATIONS FOR ALL PATIENTS WITH MEMORY PROBLEMS: 1. Continue to exercise (Recommend 30 minutes of walking everyday, or 3 hours every week) 2. Increase social interactions - continue going to [place] and enjoy social gatherings with friends and family 3. Eat healthy, avoid fried foods and eat more fruits and vegetables 4. Maintain adequate blood pressure, blood sugar, and blood cholesterol level. Reducing the risk of stroke and cardiovascular disease also helps promoting better memory. 5. Avoid stressful situations. Live a simple life and avoid aggravations. Organize your time and prepare for the next day in anticipation. 6. Sleep well, avoid any interruptions of sleep and avoid any distractions in the bedroom that may interfere with adequate sleep quality 7. Avoid sugar, avoid sweets as there is a strong link between excessive sugar intake, diabetes, and cognitive impairment The Mediterranean diet has been shown to help patients reduce the risk of progressive memory disorders and reduces cardiovascular risk. This includes eating fish, eat fruits and green leafy vegetables, nuts like almonds and hazelnuts, walnuts, and also use olive oil. Avoid fast foods and fried foods as much as possible. Avoid sweets and sugar as sugar use has been linked to worsening of memory function.  There is always a concern of gradual progression of memory problems. If this is the case, then we may need to adjust level of care according to patient needs. Support, both to the patient and caregiver, should then be put into place.

## 2021-04-20 ENCOUNTER — Ambulatory Visit: Payer: Medicare Other | Admitting: Neurology

## 2021-06-09 DIAGNOSIS — H353131 Nonexudative age-related macular degeneration, bilateral, early dry stage: Secondary | ICD-10-CM | POA: Diagnosis not present

## 2021-06-09 DIAGNOSIS — Z7984 Long term (current) use of oral hypoglycemic drugs: Secondary | ICD-10-CM | POA: Diagnosis not present

## 2021-06-09 DIAGNOSIS — Z961 Presence of intraocular lens: Secondary | ICD-10-CM | POA: Diagnosis not present

## 2021-06-09 DIAGNOSIS — E119 Type 2 diabetes mellitus without complications: Secondary | ICD-10-CM | POA: Diagnosis not present

## 2021-06-23 DIAGNOSIS — Z23 Encounter for immunization: Secondary | ICD-10-CM | POA: Diagnosis not present

## 2021-06-30 DIAGNOSIS — Z23 Encounter for immunization: Secondary | ICD-10-CM | POA: Diagnosis not present

## 2021-10-08 DIAGNOSIS — G309 Alzheimer's disease, unspecified: Secondary | ICD-10-CM | POA: Diagnosis not present

## 2021-10-08 DIAGNOSIS — I129 Hypertensive chronic kidney disease with stage 1 through stage 4 chronic kidney disease, or unspecified chronic kidney disease: Secondary | ICD-10-CM | POA: Diagnosis not present

## 2021-10-08 DIAGNOSIS — N2581 Secondary hyperparathyroidism of renal origin: Secondary | ICD-10-CM | POA: Diagnosis not present

## 2021-10-08 DIAGNOSIS — N184 Chronic kidney disease, stage 4 (severe): Secondary | ICD-10-CM | POA: Diagnosis not present

## 2021-10-08 DIAGNOSIS — M1990 Unspecified osteoarthritis, unspecified site: Secondary | ICD-10-CM | POA: Diagnosis not present

## 2021-10-08 DIAGNOSIS — Z1339 Encounter for screening examination for other mental health and behavioral disorders: Secondary | ICD-10-CM | POA: Diagnosis not present

## 2021-10-08 DIAGNOSIS — E11319 Type 2 diabetes mellitus with unspecified diabetic retinopathy without macular edema: Secondary | ICD-10-CM | POA: Diagnosis not present

## 2021-10-08 DIAGNOSIS — E1129 Type 2 diabetes mellitus with other diabetic kidney complication: Secondary | ICD-10-CM | POA: Diagnosis not present

## 2021-10-08 DIAGNOSIS — D631 Anemia in chronic kidney disease: Secondary | ICD-10-CM | POA: Diagnosis not present

## 2021-10-08 DIAGNOSIS — D692 Other nonthrombocytopenic purpura: Secondary | ICD-10-CM | POA: Diagnosis not present

## 2021-10-08 DIAGNOSIS — Z1331 Encounter for screening for depression: Secondary | ICD-10-CM | POA: Diagnosis not present

## 2021-10-08 DIAGNOSIS — M109 Gout, unspecified: Secondary | ICD-10-CM | POA: Diagnosis not present

## 2021-10-12 ENCOUNTER — Inpatient Hospital Stay (HOSPITAL_COMMUNITY)
Admission: EM | Admit: 2021-10-12 | Discharge: 2021-11-04 | DRG: 956 | Disposition: E | Payer: Medicare Other | Attending: Surgery | Admitting: Surgery

## 2021-10-12 ENCOUNTER — Inpatient Hospital Stay (HOSPITAL_COMMUNITY): Payer: Medicare Other

## 2021-10-12 ENCOUNTER — Emergency Department (HOSPITAL_COMMUNITY): Payer: Medicare Other

## 2021-10-12 ENCOUNTER — Encounter (HOSPITAL_COMMUNITY): Payer: Self-pay

## 2021-10-12 DIAGNOSIS — Z882 Allergy status to sulfonamides status: Secondary | ICD-10-CM

## 2021-10-12 DIAGNOSIS — F03B18 Unspecified dementia, moderate, with other behavioral disturbance: Secondary | ICD-10-CM | POA: Diagnosis not present

## 2021-10-12 DIAGNOSIS — R569 Unspecified convulsions: Secondary | ICD-10-CM | POA: Diagnosis not present

## 2021-10-12 DIAGNOSIS — D631 Anemia in chronic kidney disease: Secondary | ICD-10-CM | POA: Diagnosis not present

## 2021-10-12 DIAGNOSIS — N2581 Secondary hyperparathyroidism of renal origin: Secondary | ICD-10-CM | POA: Diagnosis not present

## 2021-10-12 DIAGNOSIS — Z789 Other specified health status: Secondary | ICD-10-CM | POA: Diagnosis not present

## 2021-10-12 DIAGNOSIS — E872 Acidosis, unspecified: Secondary | ICD-10-CM | POA: Diagnosis not present

## 2021-10-12 DIAGNOSIS — F02B18 Dementia in other diseases classified elsewhere, moderate, with other behavioral disturbance: Secondary | ICD-10-CM | POA: Diagnosis present

## 2021-10-12 DIAGNOSIS — D649 Anemia, unspecified: Secondary | ICD-10-CM | POA: Diagnosis not present

## 2021-10-12 DIAGNOSIS — I6523 Occlusion and stenosis of bilateral carotid arteries: Secondary | ICD-10-CM | POA: Diagnosis not present

## 2021-10-12 DIAGNOSIS — S42222A 2-part displaced fracture of surgical neck of left humerus, initial encounter for closed fracture: Secondary | ICD-10-CM | POA: Diagnosis present

## 2021-10-12 DIAGNOSIS — I10 Essential (primary) hypertension: Secondary | ICD-10-CM | POA: Diagnosis present

## 2021-10-12 DIAGNOSIS — R52 Pain, unspecified: Secondary | ICD-10-CM | POA: Diagnosis not present

## 2021-10-12 DIAGNOSIS — E119 Type 2 diabetes mellitus without complications: Secondary | ICD-10-CM | POA: Diagnosis not present

## 2021-10-12 DIAGNOSIS — S72002A Fracture of unspecified part of neck of left femur, initial encounter for closed fracture: Secondary | ICD-10-CM | POA: Diagnosis present

## 2021-10-12 DIAGNOSIS — E11649 Type 2 diabetes mellitus with hypoglycemia without coma: Secondary | ICD-10-CM | POA: Diagnosis not present

## 2021-10-12 DIAGNOSIS — S065X0A Traumatic subdural hemorrhage without loss of consciousness, initial encounter: Secondary | ICD-10-CM | POA: Diagnosis not present

## 2021-10-12 DIAGNOSIS — R55 Syncope and collapse: Secondary | ICD-10-CM | POA: Diagnosis not present

## 2021-10-12 DIAGNOSIS — R4587 Impulsiveness: Secondary | ICD-10-CM | POA: Diagnosis not present

## 2021-10-12 DIAGNOSIS — W1830XA Fall on same level, unspecified, initial encounter: Secondary | ICD-10-CM | POA: Diagnosis present

## 2021-10-12 DIAGNOSIS — Z7189 Other specified counseling: Secondary | ICD-10-CM | POA: Diagnosis not present

## 2021-10-12 DIAGNOSIS — Z9079 Acquired absence of other genital organ(s): Secondary | ICD-10-CM

## 2021-10-12 DIAGNOSIS — I129 Hypertensive chronic kidney disease with stage 1 through stage 4 chronic kidney disease, or unspecified chronic kidney disease: Secondary | ICD-10-CM | POA: Diagnosis not present

## 2021-10-12 DIAGNOSIS — Z885 Allergy status to narcotic agent status: Secondary | ICD-10-CM

## 2021-10-12 DIAGNOSIS — S066X0A Traumatic subarachnoid hemorrhage without loss of consciousness, initial encounter: Secondary | ICD-10-CM | POA: Diagnosis not present

## 2021-10-12 DIAGNOSIS — Z66 Do not resuscitate: Secondary | ICD-10-CM | POA: Diagnosis present

## 2021-10-12 DIAGNOSIS — Z79899 Other long term (current) drug therapy: Secondary | ICD-10-CM

## 2021-10-12 DIAGNOSIS — S51812A Laceration without foreign body of left forearm, initial encounter: Secondary | ICD-10-CM | POA: Diagnosis present

## 2021-10-12 DIAGNOSIS — M5030 Other cervical disc degeneration, unspecified cervical region: Secondary | ICD-10-CM | POA: Diagnosis not present

## 2021-10-12 DIAGNOSIS — I471 Supraventricular tachycardia: Secondary | ICD-10-CM | POA: Diagnosis not present

## 2021-10-12 DIAGNOSIS — D62 Acute posthemorrhagic anemia: Secondary | ICD-10-CM | POA: Diagnosis present

## 2021-10-12 DIAGNOSIS — Z7984 Long term (current) use of oral hypoglycemic drugs: Secondary | ICD-10-CM

## 2021-10-12 DIAGNOSIS — I959 Hypotension, unspecified: Secondary | ICD-10-CM | POA: Diagnosis not present

## 2021-10-12 DIAGNOSIS — Z9114 Patient's other noncompliance with medication regimen: Secondary | ICD-10-CM

## 2021-10-12 DIAGNOSIS — E7800 Pure hypercholesterolemia, unspecified: Secondary | ICD-10-CM | POA: Diagnosis present

## 2021-10-12 DIAGNOSIS — R339 Retention of urine, unspecified: Secondary | ICD-10-CM | POA: Diagnosis not present

## 2021-10-12 DIAGNOSIS — W19XXXA Unspecified fall, initial encounter: Secondary | ICD-10-CM | POA: Diagnosis not present

## 2021-10-12 DIAGNOSIS — N400 Enlarged prostate without lower urinary tract symptoms: Secondary | ICD-10-CM | POA: Diagnosis present

## 2021-10-12 DIAGNOSIS — G309 Alzheimer's disease, unspecified: Secondary | ICD-10-CM | POA: Diagnosis present

## 2021-10-12 DIAGNOSIS — M25552 Pain in left hip: Secondary | ICD-10-CM | POA: Diagnosis not present

## 2021-10-12 DIAGNOSIS — Z880 Allergy status to penicillin: Secondary | ICD-10-CM

## 2021-10-12 DIAGNOSIS — E7849 Other hyperlipidemia: Secondary | ICD-10-CM | POA: Diagnosis present

## 2021-10-12 DIAGNOSIS — Y92009 Unspecified place in unspecified non-institutional (private) residence as the place of occurrence of the external cause: Secondary | ICD-10-CM | POA: Diagnosis not present

## 2021-10-12 DIAGNOSIS — S0993XA Unspecified injury of face, initial encounter: Secondary | ICD-10-CM | POA: Diagnosis not present

## 2021-10-12 DIAGNOSIS — M25519 Pain in unspecified shoulder: Secondary | ICD-10-CM | POA: Diagnosis not present

## 2021-10-12 DIAGNOSIS — S42309A Unspecified fracture of shaft of humerus, unspecified arm, initial encounter for closed fracture: Secondary | ICD-10-CM | POA: Diagnosis not present

## 2021-10-12 DIAGNOSIS — M1990 Unspecified osteoarthritis, unspecified site: Secondary | ICD-10-CM | POA: Diagnosis not present

## 2021-10-12 DIAGNOSIS — T1490XA Injury, unspecified, initial encounter: Secondary | ICD-10-CM

## 2021-10-12 DIAGNOSIS — Z515 Encounter for palliative care: Secondary | ICD-10-CM

## 2021-10-12 DIAGNOSIS — R4182 Altered mental status, unspecified: Secondary | ICD-10-CM | POA: Diagnosis not present

## 2021-10-12 DIAGNOSIS — Z20822 Contact with and (suspected) exposure to covid-19: Secondary | ICD-10-CM | POA: Diagnosis present

## 2021-10-12 DIAGNOSIS — I619 Nontraumatic intracerebral hemorrhage, unspecified: Secondary | ICD-10-CM | POA: Diagnosis not present

## 2021-10-12 DIAGNOSIS — N184 Chronic kidney disease, stage 4 (severe): Secondary | ICD-10-CM | POA: Diagnosis present

## 2021-10-12 DIAGNOSIS — S42295A Other nondisplaced fracture of upper end of left humerus, initial encounter for closed fracture: Secondary | ICD-10-CM | POA: Diagnosis not present

## 2021-10-12 DIAGNOSIS — Z96642 Presence of left artificial hip joint: Secondary | ICD-10-CM | POA: Diagnosis not present

## 2021-10-12 DIAGNOSIS — Z794 Long term (current) use of insulin: Secondary | ICD-10-CM

## 2021-10-12 DIAGNOSIS — E1122 Type 2 diabetes mellitus with diabetic chronic kidney disease: Secondary | ICD-10-CM | POA: Diagnosis not present

## 2021-10-12 DIAGNOSIS — J9811 Atelectasis: Secondary | ICD-10-CM | POA: Diagnosis not present

## 2021-10-12 DIAGNOSIS — D171 Benign lipomatous neoplasm of skin and subcutaneous tissue of trunk: Secondary | ICD-10-CM | POA: Diagnosis not present

## 2021-10-12 DIAGNOSIS — Z471 Aftercare following joint replacement surgery: Secondary | ICD-10-CM | POA: Diagnosis not present

## 2021-10-12 DIAGNOSIS — Z87891 Personal history of nicotine dependence: Secondary | ICD-10-CM

## 2021-10-12 DIAGNOSIS — R410 Disorientation, unspecified: Secondary | ICD-10-CM | POA: Diagnosis not present

## 2021-10-12 DIAGNOSIS — S199XXA Unspecified injury of neck, initial encounter: Secondary | ICD-10-CM | POA: Diagnosis not present

## 2021-10-12 DIAGNOSIS — W2209XA Striking against other stationary object, initial encounter: Secondary | ICD-10-CM | POA: Diagnosis present

## 2021-10-12 DIAGNOSIS — S06360A Traumatic hemorrhage of cerebrum, unspecified, without loss of consciousness, initial encounter: Secondary | ICD-10-CM | POA: Diagnosis not present

## 2021-10-12 DIAGNOSIS — S42392A Other fracture of shaft of left humerus, initial encounter for closed fracture: Secondary | ICD-10-CM | POA: Diagnosis not present

## 2021-10-12 DIAGNOSIS — S06369A Traumatic hemorrhage of cerebrum, unspecified, with loss of consciousness of unspecified duration, initial encounter: Principal | ICD-10-CM | POA: Diagnosis present

## 2021-10-12 DIAGNOSIS — Z9889 Other specified postprocedural states: Secondary | ICD-10-CM | POA: Diagnosis not present

## 2021-10-12 DIAGNOSIS — S728X2A Other fracture of left femur, initial encounter for closed fracture: Secondary | ICD-10-CM | POA: Diagnosis not present

## 2021-10-12 DIAGNOSIS — R0902 Hypoxemia: Secondary | ICD-10-CM | POA: Diagnosis not present

## 2021-10-12 LAB — CBC
HCT: 31.3 % — ABNORMAL LOW (ref 39.0–52.0)
Hemoglobin: 10.1 g/dL — ABNORMAL LOW (ref 13.0–17.0)
MCH: 32.1 pg (ref 26.0–34.0)
MCHC: 32.3 g/dL (ref 30.0–36.0)
MCV: 99.4 fL (ref 80.0–100.0)
Platelets: 156 10*3/uL (ref 150–400)
RBC: 3.15 MIL/uL — ABNORMAL LOW (ref 4.22–5.81)
RDW: 14.8 % (ref 11.5–15.5)
WBC: 11 10*3/uL — ABNORMAL HIGH (ref 4.0–10.5)
nRBC: 0 % (ref 0.0–0.2)

## 2021-10-12 LAB — COMPREHENSIVE METABOLIC PANEL
ALT: 14 U/L (ref 0–44)
AST: 19 U/L (ref 15–41)
Albumin: 3.3 g/dL — ABNORMAL LOW (ref 3.5–5.0)
Alkaline Phosphatase: 64 U/L (ref 38–126)
Anion gap: 15 (ref 5–15)
BUN: 35 mg/dL — ABNORMAL HIGH (ref 8–23)
CO2: 19 mmol/L — ABNORMAL LOW (ref 22–32)
Calcium: 8.5 mg/dL — ABNORMAL LOW (ref 8.9–10.3)
Chloride: 108 mmol/L (ref 98–111)
Creatinine, Ser: 3.19 mg/dL — ABNORMAL HIGH (ref 0.61–1.24)
GFR, Estimated: 18 mL/min — ABNORMAL LOW (ref >60)
Glucose, Bld: 158 mg/dL — ABNORMAL HIGH (ref 70–99)
Potassium: 4.8 mmol/L (ref 3.5–5.1)
Sodium: 142 mmol/L (ref 135–145)
Total Bilirubin: 0.9 mg/dL (ref 0.3–1.2)
Total Protein: 6.1 g/dL — ABNORMAL LOW (ref 6.5–8.1)

## 2021-10-12 LAB — GLUCOSE, CAPILLARY: Glucose-Capillary: 309 mg/dL — ABNORMAL HIGH (ref 70–99)

## 2021-10-12 LAB — CBG MONITORING, ED: Glucose-Capillary: 143 mg/dL — ABNORMAL HIGH (ref 70–99)

## 2021-10-12 MED ORDER — TAMSULOSIN HCL 0.4 MG PO CAPS
0.4000 mg | ORAL_CAPSULE | Freq: Every evening | ORAL | Status: DC
  Administered 2021-10-12 – 2021-10-14 (×2): 0.4 mg via ORAL
  Filled 2021-10-12 (×2): qty 1

## 2021-10-12 MED ORDER — ACETAMINOPHEN 500 MG PO TABS
1000.0000 mg | ORAL_TABLET | Freq: Four times a day (QID) | ORAL | Status: DC
  Administered 2021-10-12 – 2021-10-15 (×5): 1000 mg via ORAL
  Filled 2021-10-12 (×10): qty 2

## 2021-10-12 MED ORDER — MEMANTINE HCL 10 MG PO TABS
10.0000 mg | ORAL_TABLET | Freq: Two times a day (BID) | ORAL | Status: DC
  Administered 2021-10-13 – 2021-10-15 (×5): 10 mg via ORAL
  Filled 2021-10-12 (×7): qty 1

## 2021-10-12 MED ORDER — LEVETIRACETAM IN NACL 500 MG/100ML IV SOLN
500.0000 mg | Freq: Two times a day (BID) | INTRAVENOUS | Status: DC
  Administered 2021-10-12 – 2021-10-15 (×6): 500 mg via INTRAVENOUS
  Filled 2021-10-12 (×7): qty 100

## 2021-10-12 MED ORDER — DIVALPROEX SODIUM 500 MG PO DR TAB
500.0000 mg | DELAYED_RELEASE_TABLET | Freq: Every evening | ORAL | Status: DC
  Administered 2021-10-12: 500 mg via ORAL
  Filled 2021-10-12 (×4): qty 1

## 2021-10-12 MED ORDER — FUROSEMIDE 20 MG PO TABS
20.0000 mg | ORAL_TABLET | ORAL | Status: DC
Start: 2021-10-12 — End: 2021-10-12

## 2021-10-12 MED ORDER — GABAPENTIN 100 MG PO CAPS
100.0000 mg | ORAL_CAPSULE | Freq: Every day | ORAL | Status: DC
  Administered 2021-10-13 – 2021-10-14 (×2): 100 mg via ORAL
  Filled 2021-10-12 (×3): qty 1

## 2021-10-12 MED ORDER — ALLOPURINOL 100 MG PO TABS
100.0000 mg | ORAL_TABLET | Freq: Every day | ORAL | Status: DC
  Administered 2021-10-13 – 2021-10-14 (×2): 100 mg via ORAL
  Filled 2021-10-12 (×3): qty 1

## 2021-10-12 MED ORDER — DIPHENHYDRAMINE HCL 12.5 MG/5ML PO ELIX
6.2500 mg | ORAL_SOLUTION | Freq: Four times a day (QID) | ORAL | Status: DC | PRN
  Filled 2021-10-12: qty 5

## 2021-10-12 MED ORDER — FUROSEMIDE 20 MG PO TABS
20.0000 mg | ORAL_TABLET | Freq: Every day | ORAL | Status: DC
  Administered 2021-10-13 – 2021-10-15 (×3): 20 mg via ORAL
  Filled 2021-10-12 (×3): qty 1

## 2021-10-12 MED ORDER — LINAGLIPTIN 5 MG PO TABS
5.0000 mg | ORAL_TABLET | Freq: Every day | ORAL | Status: DC
  Filled 2021-10-12: qty 1

## 2021-10-12 MED ORDER — GLIMEPIRIDE 1 MG PO TABS: 1.0000 mg | ORAL_TABLET | Freq: Every morning | ORAL | Status: DC

## 2021-10-12 MED ORDER — MORPHINE SULFATE (PF) 2 MG/ML IV SOLN
1.0000 mg | INTRAVENOUS | Status: DC | PRN
  Administered 2021-10-12 – 2021-10-14 (×4): 2 mg via INTRAVENOUS
  Administered 2021-10-14: 1 mg via INTRAVENOUS
  Administered 2021-10-15: 2 mg via INTRAVENOUS
  Filled 2021-10-12 (×7): qty 1

## 2021-10-12 MED ORDER — ATORVASTATIN CALCIUM 10 MG PO TABS
20.0000 mg | ORAL_TABLET | Freq: Every morning | ORAL | Status: DC
  Administered 2021-10-13 – 2021-10-15 (×3): 20 mg via ORAL
  Filled 2021-10-12 (×3): qty 2

## 2021-10-12 MED ORDER — INSULIN ASPART 100 UNIT/ML IJ SOLN
0.0000 [IU] | Freq: Every day | INTRAMUSCULAR | Status: DC
  Administered 2021-10-12: 23:00:00 4 [IU] via SUBCUTANEOUS
  Administered 2021-10-13: 21:00:00 2 [IU] via SUBCUTANEOUS

## 2021-10-12 MED ORDER — ONDANSETRON 4 MG PO TBDP: 4.0000 mg | ORAL_TABLET | Freq: Four times a day (QID) | ORAL | Status: DC | PRN

## 2021-10-12 MED ORDER — ALLOPURINOL 100 MG PO TABS
100.0000 mg | ORAL_TABLET | Freq: Every day | ORAL | Status: DC
  Filled 2021-10-12: qty 1

## 2021-10-12 MED ORDER — INSULIN ASPART 100 UNIT/ML IJ SOLN
0.0000 [IU] | Freq: Three times a day (TID) | INTRAMUSCULAR | Status: DC
  Administered 2021-10-13 – 2021-10-14 (×2): 2 [IU] via SUBCUTANEOUS
  Administered 2021-10-14: 1 [IU] via SUBCUTANEOUS
  Administered 2021-10-15: 2 [IU] via SUBCUTANEOUS
  Administered 2021-10-15: 1 [IU] via SUBCUTANEOUS

## 2021-10-12 MED ORDER — IPRATROPIUM BROMIDE 0.06 % NA SOLN: 2.0000 | Freq: Three times a day (TID) | NASAL | Status: DC | PRN

## 2021-10-12 MED ORDER — DOCUSATE SODIUM 100 MG PO CAPS
100.0000 mg | ORAL_CAPSULE | Freq: Two times a day (BID) | ORAL | Status: DC
  Administered 2021-10-13 – 2021-10-14 (×4): 100 mg via ORAL
  Filled 2021-10-12 (×6): qty 1

## 2021-10-12 MED ORDER — ONDANSETRON HCL 4 MG/2ML IJ SOLN: 4.0000 mg | Freq: Four times a day (QID) | INTRAMUSCULAR | Status: DC | PRN

## 2021-10-12 MED ORDER — DONEPEZIL HCL 10 MG PO TABS
10.0000 mg | ORAL_TABLET | Freq: Every day | ORAL | Status: DC
  Administered 2021-10-13 – 2021-10-14 (×2): 10 mg via ORAL
  Filled 2021-10-12 (×4): qty 1

## 2021-10-12 MED ORDER — VITAMIN B-12 1000 MCG PO TABS
1000.0000 ug | ORAL_TABLET | Freq: Every morning | ORAL | Status: DC
  Administered 2021-10-13 – 2021-10-14 (×2): 1000 ug via ORAL
  Filled 2021-10-12 (×2): qty 1

## 2021-10-12 MED ORDER — SODIUM CHLORIDE 0.9 % IV SOLN: INTRAVENOUS | Status: DC

## 2021-10-12 MED ORDER — ALLOPURINOL 300 MG PO TABS
300.0000 mg | ORAL_TABLET | Freq: Every day | ORAL | Status: DC
  Filled 2021-10-12: qty 1

## 2021-10-12 MED ORDER — FUROSEMIDE 20 MG PO TABS: 20.0000 mg | ORAL_TABLET | ORAL | Status: DC

## 2021-10-12 MED ORDER — AMLODIPINE BESYLATE 10 MG PO TABS
10.0000 mg | ORAL_TABLET | Freq: Every morning | ORAL | Status: DC
  Administered 2021-10-13 – 2021-10-15 (×3): 10 mg via ORAL
  Filled 2021-10-12 (×3): qty 1

## 2021-10-12 MED ORDER — OXYCODONE HCL 5 MG PO TABS
2.5000 mg | ORAL_TABLET | ORAL | Status: DC | PRN
  Administered 2021-10-13 – 2021-10-15 (×7): 5 mg via ORAL
  Filled 2021-10-12 (×7): qty 1

## 2021-10-12 MED ORDER — MIRTAZAPINE 15 MG PO TABS
15.0000 mg | ORAL_TABLET | Freq: Every day | ORAL | Status: DC
  Administered 2021-10-13 – 2021-10-14 (×2): 15 mg via ORAL
  Filled 2021-10-12 (×3): qty 1

## 2021-10-12 NOTE — H&P (Signed)
Samuel [name] 01-17-1930  [id number].    Chief Complaint/Reason for Consult: Fall  HPI:  This is a very pleasant 88 year old male with a history of diabetes mellitus, dementia on Depakote for behavioral issues with no history of seizure disorder, chronic kidney disease, and hypertension who is unable to provide a history currently.  His son is at the bedside and states that his mother found the patient in bed this morning "thrashing around" likely secondary to low blood sugar as he has done this before.  She went and got him a "sugar pill".  He required a second and on the way back to the kitchen the second time, the patient got out of bed and followed her.  He then fell in the hallway likely secondary to presumed syncope from hypoglycemia.  He did hit his head on the door frame.  He complains of left hip and left shoulder pain.  Upon EMS arrival, his blood sugar was noted to be 90 after having 2 sugar pills.    Upon arrival, the patient has been noted to have multiple intracranial hemorrhages with no acute intervention needed.  There was concern of a seizure at home however it sounds like the patient was "thrashing around" likely secondary to hypoglycemia and not seizure-like activity.  He was also found to have a humeral fracture as well as a femoral neck fracture.  We have been asked to see him for admission.  ROS: ROS: Unable to obtain due to altered mental status.  History reviewed. No pertinent family history.  Past Medical History:  Diagnosis Date   BPH (benign prostatic hyperplasia)    Bruise    LEFT BACK/SHOULDER AREA   Chronic kidney disease (CKD), stage III (moderate) (HCC) BASE CRE  1.89  -- 2.2   NEPHROLOGIST-- [name]--  LOV DEC 2013  W/ CHART   Diabetes mellitus    PCP--  DR TISOVEC   History of kidney stones    Hypercholesteremia    Hyperparathyroidism, secondary ([place])    Hypertension    Iron deficiency anemia    Left shoulder strain    PT FELL ON ICE  11-06-2012    Past Surgical History:  Procedure Laterality Date   BLEPHAROPLASTY Bilateral 04-02-2005   CYSTO/ LEFT URETERAL STENT PLACEMENT  02-07-2003   CYSTO/ RIGHT URETER BALLOON DILATION/ STONE EXTRACTION  02-20-2003   GREEN LIGHT LASER TURP (TRANSURETHRAL RESECTION OF PROSTATE N/A 11/24/2012   Procedure: GREEN LIGHT LASER TURP (TRANSURETHRAL RESECTION OF PROSTATE;  Surgeon: [name], MD;  Location: [facility];  Service: Urology;  Laterality: N/A;   LEFT URETEROSCOPIC STONE EXTRACTION  02-14-2003    Social History:  reports that he quit smoking about 28 years ago. His smoking use included cigarettes. He has a 160.00 pack-year smoking history. He has never used smokeless tobacco. He reports that he does not drink alcohol and does not use drugs.  Allergies:  Allergies  Allergen Reactions   Codeine Itching   Penicillins Itching   Sulfa Antibiotics Other (See Comments)    Unknown reaction    (Not in a hospital admission)    Physical Exam: Blood pressure (!) 152/33, pulse 99, temperature 97.9 F (36.6 C), temperature source Oral, resp. rate (!) 25, height 5\' 4"  (1.626 m), weight 66.7 kg, SpO2 95 %. General: pleasant, WD, WN white male who is laying in bed in NAD HEENT: head is normocephalic, but with left periorbital ecchymosis extending to the lateral aspect of his face.  Sclera are noninjected.  PERRL.  Ears and nose without any masses or lesions.  Mouth is pink and moist.  Neck is supple.  No posterior midline tenderness. Heart: regular, rate, and rhythm with some ectopic beats.  Normal s1,s2. No obvious murmurs, gallops, or rubs noted.  Palpable radial and pedal pulses bilaterally Lungs: CTAB, no wheezes, rhonchi, or rales noted.  Respiratory effort nonlabored Abd: soft, NT, ND, +BS, no masses, hernias, or organomegaly MS: all 4 extremities are symmetrical with no cyanosis, clubbing, or edema.  Somewhat age-related limited range of motion in his  right upper extremity but no pain.  No pain to passive range of motion or palpation to his right lower extremity.  His left lower extremity is externally rotated and shortened with significant tenderness to passive range of motion in his hip.  He has edema and ecchymosis noted over the proximal left arm towards his shoulder.  He also has a small skin tear just proximal to his antecubital fossa. Skin: warm and dry with no masses, lesions, or rashes Neuro: Cranial nerves 2-12 grossly intact, sensation is normal throughout Psych: Alert but not oriented except for to self.  He does not recognize his son is sitting in the room with him.  He thinks we are at the movie theater when given choices   Results for orders placed or performed during the hospital encounter of 10/28/2021 (from the past 48 hour(s))  CBG monitoring, ED     Status: Abnormal   Collection Time: 10/09/2021 12:15 PM  Result Value Ref Range   Glucose-Capillary 143 (H) 70 - 99 mg/dL    Comment: Glucose reference range applies only to samples taken after fasting for at least 8 hours.  Comprehensive metabolic panel     Status: Abnormal   Collection Time: 10/23/2021 12:23 PM  Result Value Ref Range   Sodium 142 135 - 145 mmol/L   Potassium 4.8 3.5 - 5.1 mmol/L   Chloride 108 98 - 111 mmol/L   CO2 19 (L) 22 - 32 mmol/L   Glucose, Bld 158 (H) 70 - 99 mg/dL    Comment: Glucose reference range applies only to samples taken after fasting for at least 8 hours.   BUN 35 (H) 8 - 23 mg/dL   Creatinine, Ser 3.19 (H) 0.61 - 1.24 mg/dL   Calcium 8.5 (L) 8.9 - 10.3 mg/dL   Total Protein 6.1 (L) 6.5 - 8.1 g/dL   Albumin 3.3 (L) 3.5 - 5.0 g/dL   AST 19 15 - 41 U/L   ALT 14 0 - 44 U/L   Alkaline Phosphatase 64 38 - 126 U/L   Total Bilirubin 0.9 0.3 - 1.2 mg/dL   GFR, Estimated 18 (L) >60 mL/min    Comment: (NOTE) Calculated using the CKD-EPI Creatinine Equation (2021)    Anion gap 15 5 - 15    Comment: Performed at [facility], [place]  [address], [place], [place] [address]  CBC     Status: Abnormal   Collection Time: 10/28/2021 12:23 PM  Result Value Ref Range   WBC 11.0 (H) 4.0 - 10.5 K/uL   RBC 3.15 (L) 4.22 - 5.81 MIL/uL   Hemoglobin 10.1 (L) 13.0 - 17.0 g/dL   HCT 31.3 (L) 39.0 - 52.0 %   MCV 99.4 80.0 - 100.0 fL   MCH 32.1 26.0 - 34.0 pg   MCHC 32.3 30.0 - 36.0 g/dL   RDW 14.8 11.5 - 15.5 %   Platelets 156 150 -  400 K/uL   nRBC 0.0 0.0 - 0.2 %    Comment: Performed at [facility], [place] [address] [place], [place] [address]   DG Chest 1 View  Result Date: 10/26/2021 CLINICAL DATA:  Fall, witness syncopal episode at home while walking in hallway, unconscious for several minutes, history of dementia with baseline confusion, complaining of LEFT shoulder pain EXAM: CHEST  1 VIEW COMPARISON:  03/20/2019 FINDINGS: Normal heart size, mediastinal contours, and pulmonary vascularity. Atherosclerotic calcification aorta. Chronic RIGHT basilar subsegmental atelectasis. No acute infiltrate, pleural effusion, or pneumothorax. Osseous demineralization with displaced fracture at surgical neck of LEFT humerus. IMPRESSION: RIGHT basilar atelectasis. Displaced fracture at surgical neck LEFT humerus. Aortic Atherosclerosis (ICD10-I70.0). Electronically Signed   By: Lavonia Dana M.D.   On: 10/24/2021 13:05   CT Head Wo Contrast  Result Date: 10/24/2021 CLINICAL DATA:  Head trauma, minor (Age >= 65y); Facial trauma, blunt EXAM: CT HEAD WITHOUT CONTRAST CT MAXILLOFACIAL WITHOUT CONTRAST TECHNIQUE: Multidetector CT imaging of the head and maxillofacial structures were performed using the standard protocol without intravenous contrast. Multiplanar CT image reconstructions of the maxillofacial structures were also generated. RADIATION DOSE REDUCTION: This exam was performed according to the departmental dose-optimization program which includes automated exposure control, adjustment of the mA and/or kV according to patient size and/or use of iterative  reconstruction technique. COMPARISON:  None. FINDINGS: CT HEAD FINDINGS Brain: Small volume of acute subarachnoid hemorrhage overlying bilateral frontal convexities and the right temporal convexity. Additional small (4 mm thick) spine acute probably extra-axial hemorrhage along the left frontal convexity (favor subdural over epidural). Trace intraventricular hemorrhage layering in the left occipital lobe. No evidence of acute hemorrhage, mass effect, midline shift, mass lesion. Vascular: Calcific atherosclerosis. No hyperdense vessel identified. Skull: No evidence of acute fracture. Other: No mastoid effusions. CT MAXILLOFACIAL FINDINGS Motion limited.  Within this limitation Osseous: No fracture or mandibular dislocation. No destructive process. Orbits: Negative. No traumatic or inflammatory finding. Sinuses: Mild paranasal sinus mucosal thickening. No air-fluid levels. Soft tissues: Negative. IMPRESSION: 1. Multifocal, multi compartment hemorrhage including small volume of bifrontal and right temporal subarachnoid hemorrhage, additional left frontal extra-axial hemorrhage (favor subdural over epidural), and trace intraventricular hemorrhage. 2. Motion limited maxillofacial CT without evidence of acute fracture. Findings discussed with Dr. Verner Chol via telephone at 1:40 p.m. Electronically Signed   By: Margaretha Sheffield M.D.   On: 10/26/2021 13:43   CT Cervical Spine Wo Contrast  Result Date: 10/24/2021 CLINICAL DATA:  Neck trauma, fall, loss of consciousness, bruising to LEFT eye and LEFT side of face, EXAM: CT CERVICAL SPINE WITHOUT CONTRAST TECHNIQUE: Multidetector CT imaging of the cervical spine was performed without intravenous contrast. Multiplanar CT image reconstructions were also generated. RADIATION DOSE REDUCTION: This exam was performed according to the departmental dose-optimization program which includes automated exposure control, adjustment of the mA and/or kV according to patient size and/or  use of iterative reconstruction technique. COMPARISON:  11/06/2012 FINDINGS: Alignment: Normal Skull base and vertebrae: Osseous demineralization. Skull base intact. Head rotated on exam. Multilevel facet degenerative changes. Disc space narrowing and endplate spur formation at C5-C7. No fracture, subluxation, or bone destruction. Uncovertebral spurs encroach upon cervical neural foramina bilaterally at C5-C6. Bone island at T7 vertebral body LEFT of midline unchanged. Soft tissues and spinal canal: Prevertebral soft tissues normal thickness. Atherosclerotic calcification of internal carotid, vertebral, and common carotid arteries bilaterally including carotid bifurcations. No proximal great vessel atherosclerotic calcifications. Air-filled cervical esophagus and upper thorax. Disc levels:  No specific  abnormalities Upper chest: Small subpleural lipoma at LEFT apex, 3.3 x 2.3 cm. Lung apices otherwise clear. Other: N/A IMPRESSION: Multilevel degenerative disc and facet disease changes of the cervical spine. No acute cervical spine abnormalities. Subpleural lipoma LEFT apex 3.3 x 2.3 cm in size. Extensive atherosclerotic calcifications. Electronically Signed   By: Lavonia Dana M.D.   On: 10/29/2021 13:46   DG Shoulder Left  Result Date: 10/17/2021 CLINICAL DATA:  Witnessed syncopal episode at home walking in the hallway. Fall. Unconscious for several minutes. Left shoulder pain. EXAM: LEFT SHOULDER - 2+ VIEW COMPARISON:  Left shoulder radiographs 11/06/2012 FINDINGS: There is a new oblique fracture of the left proximal humerus surgical neck extending through the proximal diaphysis in a proximal-lateral to distal-medial orientation. Mild anterior apex angulation of the fracture line on lateral transscapular Y-view. Minimal medial apex angulation of the fracture on frontal view. Mild glenohumeral and acromioclavicular osteoarthritis. The visualized left lung is unremarkable. IMPRESSION: Mildly angulated proximal left  humeral surgical neck and diaphyseal fracture. Electronically Signed   By: Yvonne Kendall M.D.   On: 10/22/2021 13:05   DG Hip Unilat W or Wo Pelvis 2-3 Views Left  Result Date: 10/15/2021 CLINICAL DATA:  Left hip pain following a fall. EXAM: DG HIP (WITH OR WITHOUT PELVIS) 2-3V LEFT COMPARISON:  None. FINDINGS: Left femoral neck fracture with varus angulation and proximal displacement of the distal fragment. Diffuse osteopenia. Lower lumbar spine degenerative changes. Extensive arterial calcifications. IMPRESSION: 1. Left femoral neck fracture with varus angulation. 2. Extensive arterial calcifications compatible with the history of chronic kidney disease. Electronically Signed   By: Claudie Revering M.D.   On: 10/14/2021 16:10   CT Maxillofacial Wo Contrast  Result Date: 10/22/2021 CLINICAL DATA:  Head trauma, minor (Age >= 65y); Facial trauma, blunt EXAM: CT HEAD WITHOUT CONTRAST CT MAXILLOFACIAL WITHOUT CONTRAST TECHNIQUE: Multidetector CT imaging of the head and maxillofacial structures were performed using the standard protocol without intravenous contrast. Multiplanar CT image reconstructions of the maxillofacial structures were also generated. RADIATION DOSE REDUCTION: This exam was performed according to the departmental dose-optimization program which includes automated exposure control, adjustment of the mA and/or kV according to patient size and/or use of iterative reconstruction technique. COMPARISON:  None. FINDINGS: CT HEAD FINDINGS Brain: Small volume of acute subarachnoid hemorrhage overlying bilateral frontal convexities and the right temporal convexity. Additional small (4 mm thick) spine acute probably extra-axial hemorrhage along the left frontal convexity (favor subdural over epidural). Trace intraventricular hemorrhage layering in the left occipital lobe. No evidence of acute hemorrhage, mass effect, midline shift, mass lesion. Vascular: Calcific atherosclerosis. No hyperdense vessel  identified. Skull: No evidence of acute fracture. Other: No mastoid effusions. CT MAXILLOFACIAL FINDINGS Motion limited.  Within this limitation Osseous: No fracture or mandibular dislocation. No destructive process. Orbits: Negative. No traumatic or inflammatory finding. Sinuses: Mild paranasal sinus mucosal thickening. No air-fluid levels. Soft tissues: Negative. IMPRESSION: 1. Multifocal, multi compartment hemorrhage including small volume of bifrontal and right temporal subarachnoid hemorrhage, additional left frontal extra-axial hemorrhage (favor subdural over epidural), and trace intraventricular hemorrhage. 2. Motion limited maxillofacial CT without evidence of acute fracture. Findings discussed with Dr. Verner Chol via telephone at 1:40 p.m. Electronically Signed   By: Margaretha Sheffield M.D.   On: 10/08/2021 13:43      Assessment/Plan Fall Multifocal intracranial hemorrhage -Per neurosurgery.  No acute intervention warranted.  No follow-up head CT.  ICU admission for observation and neurochecks. Left humeral fracture -Per Dr. Doreatha Martin, nonop in a sling. Left  femoral neck fracture -Ortho to see tomorrow to determine further intervention.  Will leave him nonweightbearing on the left lower extremity for now. Left facial ecchymosis Diabetes mellitus -carb modified diet, n.p.o. after midnight for possible orthopedic intervention.  Home medications, sliding scale insulin as needed Baseline dementia -resume home meds CKD -follow HTN -monitor FEN - carb mod diet, NPO p MN, IVFs VTE - on hold due to head bleed ID - none currently needed Admit - ICU, inpatient High Medical Decision Making  Henreitta Cea, [facility] Surgery 10/10/2021, 4:17 PM Please see Amion for pager number during day hours 7:00am-4:30pm or 7:00am -11:30am on weekends

## 2021-10-12 NOTE — ED Notes (Addendum)
Pt c/o hip pain after returning from previous testing.  Per son, Pt's wife reported the Pt had a "low sugar" episode and possibly seizure like activity.  She administered a "sugar pill."  When she walked to the kitchen to get a 2nd "sugar pill,"  he followed her and fell.  Sts he was unresponsive for several minutes.

## 2021-10-12 NOTE — Consult Note (Signed)
Reason for Consult:SAH Referring Physician: EDP  Samuel [name] is an 88 y.o. male.   HPI:  88 year old male presents to the ED today after sustaining a fall at home.  He lives at home with his wife.  Per his son he still drives.  Has a history of Alzheimer's diabetes and seizures.  Per his son he is suspected to have a seizure at home today.  History is difficult to obtain from the patient as he is confused.  Past Medical History:  Diagnosis Date   BPH (benign prostatic hyperplasia)    Bruise    LEFT BACK/SHOULDER AREA   Chronic kidney disease (CKD), stage III (moderate) (HCC) BASE CRE  1.89  -- 2.2   NEPHROLOGIST-- [name]--  LOV DEC 2013  W/ CHART   Diabetes mellitus    PCP--  DR TISOVEC   History of kidney stones    Hypercholesteremia    Hyperparathyroidism, secondary ([place])    Hypertension    Iron deficiency anemia    Left shoulder strain    PT FELL ON ICE 11-06-2012    Past Surgical History:  Procedure Laterality Date   BLEPHAROPLASTY Bilateral 04-02-2005   CYSTO/ LEFT URETERAL STENT PLACEMENT  02-07-2003   CYSTO/ RIGHT URETER BALLOON DILATION/ STONE EXTRACTION  02-20-2003   GREEN LIGHT LASER TURP (TRANSURETHRAL RESECTION OF PROSTATE N/A 11/24/2012   Procedure: GREEN LIGHT LASER TURP (TRANSURETHRAL RESECTION OF PROSTATE;  Surgeon: [name], MD;  Location: [place];  Service: Urology;  Laterality: N/A;   LEFT URETEROSCOPIC STONE EXTRACTION  02-14-2003    Allergies  Allergen Reactions   Codeine Itching   Penicillins Itching   Sulfa Antibiotics Other (See Comments)    Unknown reaction    Social History   Tobacco Use   Smoking status: Former    Packs/day: 4.00    Years: 40.00    Pack years: 160.00    Types: Cigarettes    Quit date: 11/17/1992    Years since quitting: 28.9   Smokeless tobacco: Never  Substance Use Topics   Alcohol use: No    No family history on file.   Review of Systems  Positive ROS: As above  All  other systems have been reviewed and were otherwise negative with the exception of those mentioned in the HPI and as above.  Objective: Vital signs in last 24 hours: Pulse Rate:  [89-101] 101 (02/06 1500) Resp:  [15-23] 21 (02/06 1500) BP: (127-163)/(51-82) 153/51 (02/06 1500) SpO2:  [90 %-100 %] 97 % (02/06 1500) Weight:  [66.7 kg] 66.7 kg (02/06 1200)  General Appearance: Alert, cooperative, no distress, appears stated age Head: Normocephalic, without obvious abnormality, abrasion and bruising around left eye Eyes: PERRL, conjunctiva/corneas clear, EOM's intact, fundi benign, both eyes      Neck: Supple, symmetrical, trachea midline, no adenopathy; thyroid: No enlargement/tenderness/nodules; no carotid bruit or JVD Lungs:  respirations unlabored Heart: Regular rate and rhythm Extremities: Extremities normal, atraumatic, no cyanosis or edema Pulses: 2+ and symmetric all extremities Skin: Skin color, texture, turgor normal, no rashes or lesions  NEUROLOGIC:   Mental status: Confused not oriented, no aphasia, poor good attention span, Memory and fund of knowledge impaired Motor Exam - grossly normal, normal tone and bulk Sensory Exam - grossly normal Reflexes: symmetric, no pathologic reflexes, No Hoffman's, No clonus Coordination -not tested Gait -not tested Balance - not tested Cranial Nerves: I: smell Not tested  II: visual acuity  OS: na    OD: na  II: visual fields Full to confrontation  II: pupils Equal, round, reactive to light  III,VII: ptosis None  III,IV,VI: extraocular muscles  Full ROM  V: mastication Normal  V: facial light touch sensation  Normal  V,VII: corneal reflex  Present  VII: facial muscle function - upper  Normal  VII: facial muscle function - lower Normal  VIII: hearing Not tested  IX: soft palate elevation  Normal  IX,X: gag reflex Present  XI: trapezius strength  5/5  XI: sternocleidomastoid strength 5/5  XI: neck flexion strength  5/5  XII:  tongue strength  Normal    Data Review Lab Results  Component Value Date   WBC 11.0 (H) 11/03/2021   HGB 10.1 (L) 11/03/2021   HCT 31.3 (L) 10/25/2021   MCV 99.4 10/18/2021   PLT 156 10/11/2021   Lab Results  Component Value Date   NA 142 10/15/2021   K 4.8 10/15/2021   CL 108 10/08/2021   CO2 19 (L) 10/09/2021   BUN 35 (H) 10/09/2021   CREATININE 3.19 (H) 10/11/2021   GLUCOSE 158 (H) 10/08/2021   No results found for: INR, PROTIME  Radiology: DG Chest 1 View  Result Date: 10/07/2021 CLINICAL DATA:  Fall, witness syncopal episode at home while walking in hallway, unconscious for several minutes, history of dementia with baseline confusion, complaining of LEFT shoulder pain EXAM: CHEST  1 VIEW COMPARISON:  03/20/2019 FINDINGS: Normal heart size, mediastinal contours, and pulmonary vascularity. Atherosclerotic calcification aorta. Chronic RIGHT basilar subsegmental atelectasis. No acute infiltrate, pleural effusion, or pneumothorax. Osseous demineralization with displaced fracture at surgical neck of LEFT humerus. IMPRESSION: RIGHT basilar atelectasis. Displaced fracture at surgical neck LEFT humerus. Aortic Atherosclerosis (ICD10-I70.0). Electronically Signed   By: Lavonia Dana M.D.   On: 11/03/2021 13:05   CT Head Wo Contrast  Result Date: 10/14/2021 CLINICAL DATA:  Head trauma, minor (Age >= 65y); Facial trauma, blunt EXAM: CT HEAD WITHOUT CONTRAST CT MAXILLOFACIAL WITHOUT CONTRAST TECHNIQUE: Multidetector CT imaging of the head and maxillofacial structures were performed using the standard protocol without intravenous contrast. Multiplanar CT image reconstructions of the maxillofacial structures were also generated. RADIATION DOSE REDUCTION: This exam was performed according to the departmental dose-optimization program which includes automated exposure control, adjustment of the mA and/or kV according to patient size and/or use of iterative reconstruction technique. COMPARISON:  None.  FINDINGS: CT HEAD FINDINGS Brain: Small volume of acute subarachnoid hemorrhage overlying bilateral frontal convexities and the right temporal convexity. Additional small (4 mm thick) spine acute probably extra-axial hemorrhage along the left frontal convexity (favor subdural over epidural). Trace intraventricular hemorrhage layering in the left occipital lobe. No evidence of acute hemorrhage, mass effect, midline shift, mass lesion. Vascular: Calcific atherosclerosis. No hyperdense vessel identified. Skull: No evidence of acute fracture. Other: No mastoid effusions. CT MAXILLOFACIAL FINDINGS Motion limited.  Within this limitation Osseous: No fracture or mandibular dislocation. No destructive process. Orbits: Negative. No traumatic or inflammatory finding. Sinuses: Mild paranasal sinus mucosal thickening. No air-fluid levels. Soft tissues: Negative. IMPRESSION: 1. Multifocal, multi compartment hemorrhage including small volume of bifrontal and right temporal subarachnoid hemorrhage, additional left frontal extra-axial hemorrhage (favor subdural over epidural), and trace intraventricular hemorrhage. 2. Motion limited maxillofacial CT without evidence of acute fracture. Findings discussed with Dr. Verner Chol via telephone at 1:40 p.m. Electronically Signed   By: Margaretha Sheffield M.D.   On: 10/28/2021 13:43   CT Cervical Spine Wo Contrast  Result Date: 10/27/2021 CLINICAL DATA:  Neck trauma, fall, loss of consciousness,  bruising to LEFT eye and LEFT side of face, EXAM: CT CERVICAL SPINE WITHOUT CONTRAST TECHNIQUE: Multidetector CT imaging of the cervical spine was performed without intravenous contrast. Multiplanar CT image reconstructions were also generated. RADIATION DOSE REDUCTION: This exam was performed according to the departmental dose-optimization program which includes automated exposure control, adjustment of the mA and/or kV according to patient size and/or use of iterative reconstruction technique.  COMPARISON:  11/06/2012 FINDINGS: Alignment: Normal Skull base and vertebrae: Osseous demineralization. Skull base intact. Head rotated on exam. Multilevel facet degenerative changes. Disc space narrowing and endplate spur formation at C5-C7. No fracture, subluxation, or bone destruction. Uncovertebral spurs encroach upon cervical neural foramina bilaterally at C5-C6. Bone island at T7 vertebral body LEFT of midline unchanged. Soft tissues and spinal canal: Prevertebral soft tissues normal thickness. Atherosclerotic calcification of internal carotid, vertebral, and common carotid arteries bilaterally including carotid bifurcations. No proximal great vessel atherosclerotic calcifications. Air-filled cervical esophagus and upper thorax. Disc levels:  No specific abnormalities Upper chest: Small subpleural lipoma at LEFT apex, 3.3 x 2.3 cm. Lung apices otherwise clear. Other: N/A IMPRESSION: Multilevel degenerative disc and facet disease changes of the cervical spine. No acute cervical spine abnormalities. Subpleural lipoma LEFT apex 3.3 x 2.3 cm in size. Extensive atherosclerotic calcifications. Electronically Signed   By: Lavonia Dana M.D.   On: 10/07/2021 13:46   DG Shoulder Left  Result Date: 10/24/2021 CLINICAL DATA:  Witnessed syncopal episode at home walking in the hallway. Fall. Unconscious for several minutes. Left shoulder pain. EXAM: LEFT SHOULDER - 2+ VIEW COMPARISON:  Left shoulder radiographs 11/06/2012 FINDINGS: There is a new oblique fracture of the left proximal humerus surgical neck extending through the proximal diaphysis in a proximal-lateral to distal-medial orientation. Mild anterior apex angulation of the fracture line on lateral transscapular Y-view. Minimal medial apex angulation of the fracture on frontal view. Mild glenohumeral and acromioclavicular osteoarthritis. The visualized left lung is unremarkable. IMPRESSION: Mildly angulated proximal left humeral surgical neck and diaphyseal  fracture. Electronically Signed   By: Yvonne Kendall M.D.   On: 10/18/2021 13:05   CT Maxillofacial Wo Contrast  Result Date: 10/22/2021 CLINICAL DATA:  Head trauma, minor (Age >= 65y); Facial trauma, blunt EXAM: CT HEAD WITHOUT CONTRAST CT MAXILLOFACIAL WITHOUT CONTRAST TECHNIQUE: Multidetector CT imaging of the head and maxillofacial structures were performed using the standard protocol without intravenous contrast. Multiplanar CT image reconstructions of the maxillofacial structures were also generated. RADIATION DOSE REDUCTION: This exam was performed according to the departmental dose-optimization program which includes automated exposure control, adjustment of the mA and/or kV according to patient size and/or use of iterative reconstruction technique. COMPARISON:  None. FINDINGS: CT HEAD FINDINGS Brain: Small volume of acute subarachnoid hemorrhage overlying bilateral frontal convexities and the right temporal convexity. Additional small (4 mm thick) spine acute probably extra-axial hemorrhage along the left frontal convexity (favor subdural over epidural). Trace intraventricular hemorrhage layering in the left occipital lobe. No evidence of acute hemorrhage, mass effect, midline shift, mass lesion. Vascular: Calcific atherosclerosis. No hyperdense vessel identified. Skull: No evidence of acute fracture. Other: No mastoid effusions. CT MAXILLOFACIAL FINDINGS Motion limited.  Within this limitation Osseous: No fracture or mandibular dislocation. No destructive process. Orbits: Negative. No traumatic or inflammatory finding. Sinuses: Mild paranasal sinus mucosal thickening. No air-fluid levels. Soft tissues: Negative. IMPRESSION: 1. Multifocal, multi compartment hemorrhage including small volume of bifrontal and right temporal subarachnoid hemorrhage, additional left frontal extra-axial hemorrhage (favor subdural over epidural), and trace intraventricular hemorrhage. 2. Motion limited  maxillofacial CT without  evidence of acute fracture. Findings discussed with Dr. Verner Chol via telephone at 1:40 p.m. Electronically Signed   By: Margaretha Sheffield M.D.   On: 10/29/2021 13:43    Assessment/Plan: 88 year old male presenting to the ED today after sustaining a fall at home.  CT head shows multiple subarachnoid hemorrhages, no mass effect or midline shift.  Small subdural on the left.  Would not recommend follow-up head CT unless his neurologic status changes. Neuro checks q2 hours.  We will continue to follow.   Samuel [name] 10/28/2021 3:13 PM

## 2021-10-12 NOTE — ED Notes (Signed)
Patient transported to X-ray 

## 2021-10-12 NOTE — Assessment & Plan Note (Addendum)
-  Patient was taken off insulin, currently on Amaryl and Januvia at home. Hold oral meds, cover with sensitive-scale SSI

## 2021-10-12 NOTE — Assessment & Plan Note (Addendum)
-  Ortho has consulted, sling recommended, non-surgical

## 2021-10-12 NOTE — Assessment & Plan Note (Addendum)
-  confirmed with son by admitting -palliative care following-- meeting planned for today

## 2021-10-12 NOTE — Consult Note (Signed)
Consultation Note Date: 10/14/2021   Patient Name: Samuel [name]  DOB: 02/08/1930  MRN: [id number]  Age / Sex: 88 y.o., male  PCP: [name], Samuel Him, MD Referring Physician: Md, Trauma, MD  Reason for Consultation: Establishing goals of care  HPI/Patient Profile: 88 y.o. male  with past medical history of BPH, stage 3b CKD, DM, Alzheimer's dementia, HTN, and HLD presented to ED on 10/26/2021 from home after wife witnessed syncopal event vs seizure with fall. He lost consciousness for several minutes. Neurosurgery was consulted who recommended patient be admitted for seizure work up. Orthopedics were consulted and recommended non-surgical treatment with sling, NWB, and outpatient follow up. Patient was admitted on 10/26/2021 with fall at home, CKD stage 4, humerus fracture, intracranial hemorrhage.   Clinical Assessment and Goals of Care: I have reviewed medical records including EPIC notes, labs, and imaging. Received report from primary RN - no acute concerns. RN reports patient remains confused but is able to answer some simple questions.   Went to visit patient at bedside - son/Samuel [name] present. Patient was being transported to x-ray as I arrived to room - was not able to perform assessment at this time.  Samuel [name] called his brother/HCPOA 2/Samuel - family meeting scheduled for this afternoon at 4:30p.  4:30 PM Met with son/HCPOA #2/Samuel, daughter in law/Samuel [name], granddaughter/Samuel [name], grandson/Samuel [name], son/Samuel [name] in ED conference room to discuss diagnosis, prognosis, GOC, EOL wishes, disposition, and options.  I introduced Palliative Medicine as specialized medical care for people living with serious illness. It focuses on providing relief from the symptoms and stress of a serious illness. The goal is to improve quality of life for both the patient and the family.  We discussed a brief life review of the patient as well as  functional and nutritional status. Patient and his wife have been married for 70 years - they have 4 children together. Prior to hospitalization, patient was living in a private residence with his wife. Before hospitalization, patient was able to ambulate independently (however did have an unsteady gait) but did require some assistance with dressing/bathing. He had a HHRN visit 3 days per week to assist with his medications. Family describe the patient's appetite as poor. Family state the patient has become increasingly feeble with increased cognitive impairment over the last year.   We discussed patient's current illness and what it means in the larger context of patient's on-going co-morbidities.  Son/Samuel has a clear understanding that dementia and CKD are progressive, non-curable diseases underlying the patient's current acute medical conditions. Acute medical situation was reviewed in detail. Patient's history is significant for gradually worsening progressive symptoms of dementia. Natural disease trajectory and expectations at EOL were discussed. I attempted to elicit values and goals of care important to the patient. The difference between aggressive medical intervention and comfort care was considered in light of the patient's goals of care. Family would like to continue full scope interventions with watchful waiting for 24-48 hours.   Hospice and Palliative Care services outpatient were explained and offered. Family  are considering patient discharge with home hospice services and private duty caregivers vs LTC with/without hospice vs rehab.  Discussed that the goal of rehab is improvement/stabalization of functional status,  which can be a difficult goal to meet for patients with advanced illness and multiple medical conditions. Reviewed what is needed for someone to have a positive rehabilitation experience to include adequate nutritional intake as well as willingness/ability to  participate.  Encouraged patient/family to consider DNR/DNI status understanding evidenced based poor outcomes in similar hospitalized patient, as the cause of arrest is likely associated with advanced chronic/terminal illness rather than an easily reversible acute cardio-pulmonary event. I explained that DNR/DNI does not change the medical plan and it only comes into effect after a person has arrested (died).  It is a protective measure to keep [place] from harming the patient in their last moments of life. Family were agreeable to DNR/DNI with understanding that he would not receive CPR, defibrillation, ACLS medications, or intubation. Family do request I speak with patient's wife about this topic again tomorrow. They feel she is overwhelmed today and request family meeting tomorrow to continue Samuel [name].    Advance directives, concepts specific to code status, artificial feeding and hydration, and rehospitalization were considered and discussed.  Discussed with patient/family the importance of continued conversation with each other and the medical providers regarding overall plan of care and treatment options, ensuring decisions are within the context of the patients values and GOCs.    Questions and concerns were addressed. The patient/family was encouraged to call with questions and/or concerns. PMT card was provided.  Escorted family to patient's inpatient room on [facility]. Patient was awake, confused, impulsive. Xray of hip resulted - reviewed with family possible anticipated recommendations to prepare for, for decision making to include but not limited to: decision for surgery, recommendation for rehab.   Primary Decision Maker: NEXT OF KIN/HCPOA: HCPOA #1 - wife/Samuel [name], #2 - son/Samuel [name]    Recommendations/Plan: Continue current medical treatment with watchful waiting for 24-48 hours Now DNR/DNI Family considering possibility of patient discharging home with hospice and private duty caregiver  support [facility] consulted for: family's request for private duty caregiver information Family requested another meeting with PMT tomorrow 2/7 - scheduled for 1:30pm PMT will continue to follow and support holistically  Code Status/Advance Care Planning: DNR  Palliative Prophylaxis:  Aspiration, Bowel Regimen, Delirium Protocol, Frequent Pain Assessment, Oral Care, and Turn Reposition  Additional Recommendations (Limitations, Scope, Preferences): Full Scope Treatment and No Tracheostomy  Psycho-social/Spiritual:  Created space and opportunity for patient and family to express thoughts and feelings regarding patient's current medical situation.  Emotional support and therapeutic listening provided.  Prognosis:  Unable to determine  Discharge Planning: To Be Determined      Primary Diagnoses: Present on Admission:  ICH (intracerebral hemorrhage) ([place])   I have reviewed the medical record, interviewed the patient and family, and examined the patient. The following aspects are pertinent.  Past Medical History:  Diagnosis Date   BPH (benign prostatic hyperplasia)    Bruise    LEFT BACK/SHOULDER AREA   Chronic kidney disease (CKD), stage III (moderate) (HCC) BASE CRE  1.89  -- 2.2   NEPHROLOGIST-- [name]--  LOV DEC 2013  W/ CHART   Diabetes mellitus    PCP--  DR [name]   History of kidney stones    Hypercholesteremia    Hyperparathyroidism, secondary ([place])    Hypertension    Iron deficiency anemia    Left shoulder strain  PT FELL ON ICE 11-06-2012   Social History   Socioeconomic History   Marital status: Married    Spouse name: Not on file   Number of children: Not on file   Years of education: Not on file   Highest education level: Not on file  Occupational History   Not on file  Tobacco Use   Smoking status: Former    Packs/day: 4.00    Years: 40.00    Pack years: 160.00    Types: Cigarettes    Quit date: 11/17/1992    Years since quitting: 28.9    Smokeless tobacco: Never  Vaping Use   Vaping Use: Never used  Substance and Sexual Activity   Alcohol use: No   Drug use: No   Sexual activity: Not Currently    Partners: Female  Other Topics Concern   Not on file  Social History Narrative   Lives with wife in a one story home.  Has 4 children.  Retired Psychologist, sport and exercise.     Social Determinants of Health   Financial Resource Strain: Not on file  Food Insecurity: Not on file  Transportation Needs: Not on file  Physical Activity: Not on file  Stress: Not on file  Social Connections: Not on file   No family history on file. Scheduled Meds:  acetaminophen  1,000 mg Oral Q6H   allopurinol  300 mg Oral QHS   [START ON 10/15/2021] amLODipine  10 mg Oral q morning   [START ON 10/24/2021] atorvastatin  20 mg Oral q morning   divalproex  500 mg Oral QPM   docusate sodium  100 mg Oral BID   donepezil  10 mg Oral QHS   furosemide  20 mg Oral Daily   furosemide  20 mg Oral Q M,W,F   gabapentin  100 mg Oral QHS   [START ON 10/22/2021] glimepiride  1 mg Oral q morning   linagliptin  5 mg Oral Daily   memantine  10 mg Oral BID   mirtazapine  15 mg Oral QHS   tamsulosin  0.4 mg Oral QPM   [START ON 10/20/2021] vitamin B-12  1,000 mcg Oral q morning   Continuous Infusions:  sodium chloride     levETIRAcetam     PRN Meds:.diphenhydrAMINE, ipratropium, morphine injection, ondansetron **OR** ondansetron (ZOFRAN) IV, oxyCODONE Medications Prior to Admission:  Prior to Admission medications   Medication Sig Start Date End Date Taking? Authorizing Provider  allopurinol (ZYLOPRIM) 300 MG tablet Take 300 mg by mouth at bedtime. 03/07/19  Yes [provider]  amLODipine (NORVASC) 10 MG tablet Take 10 mg by mouth every morning.   Yes [provider]  atorvastatin (LIPITOR) 20 MG tablet Take 20 mg by mouth every morning.   Yes [provider]  divalproex (DEPAKOTE) 500 MG DR tablet Take 500 mg by mouth every evening. 04/04/19  Yes  [provider]  donepezil (ARICEPT) 10 MG tablet TAKE 1 TABLET BY MOUTH EVERY DAY Patient taking differently: Take 10 mg by mouth at bedtime. 04/14/21  Yes Rondel Jumbo, PA-C  furosemide (LASIX) 20 MG tablet Take 20 mg by mouth See admin instructions. Take one tablet (20 mg) by mouth every morning, take an additional tablet (20 mg) at noon on Monday, Wednesday, Friday   Yes [provider]  gabapentin (NEURONTIN) 100 MG capsule Take 1 capsule (100 mg total) by mouth at bedtime. 04/14/21  Yes Rondel Jumbo, PA-C  glimepiride (AMARYL) 1 MG tablet Take 1 mg by  mouth every morning. 10/08/21  Yes [provider]  ipratropium (ATROVENT) 0.06 % nasal spray Place 2 sprays into both nostrils 3 (three) times daily as needed for rhinitis (congestion).   Yes [provider]  memantine (NAMENDA) 10 MG tablet TAKE 1 TABLET BY MOUTH TWICE A DAY Patient taking differently: Take 10 mg by mouth 2 (two) times daily. 04/14/21  Yes Rondel Jumbo, PA-C  mirtazapine (REMERON) 15 MG tablet Take 1 tab at night Patient taking differently: Take 15 mg by mouth at bedtime. 04/14/21  Yes Rondel Jumbo, PA-C  sitaGLIPtin (JANUVIA) 50 MG tablet Take 50 mg by mouth every morning.    Yes [provider]  tamsulosin (FLOMAX) 0.4 MG CAPS Take 0.4 mg by mouth every evening.   Yes [provider]  vitamin B-12 (CYANOCOBALAMIN) 1000 MCG tablet Take 1,000 mcg by mouth every morning.    Yes [provider]   Allergies  Allergen Reactions   Codeine Itching   Penicillins Itching   Sulfa Antibiotics Other (See Comments)    Unknown reaction   Review of Systems  Unable to perform ROS: Dementia   Physical Exam Vitals and nursing note reviewed.  Constitutional:      General: He is not in acute distress. Pulmonary:     Effort: No respiratory distress.  Skin:    General: Skin is warm and dry.  Neurological:     Mental Status: He is lethargic, disoriented and confused.      Motor: Weakness present.  Psychiatric:        Mood and Affect: Mood is anxious.        Cognition and Memory: Cognition is impaired. Memory is impaired.        Judgment: Judgment is impulsive.    Vital Signs: BP (!) 153/51    Pulse (!) 101    Resp (!) 21    Ht _0  (1.626 m)    Wt 66.7 kg    SpO2 97%    BMI 25.24 kg/m  Pain Scale: PAINAD   Pain Score: 10-Worst pain ever   SpO2: SpO2: 97 % O2 Device:SpO2: 97 % O2 Flow Rate: .   IO: Intake/output summary: No intake or output data in the 24 hours ending 10/07/2021 1532  LBM:   Baseline Weight: Weight: 66.7 kg Most recent weight: Weight: 66.7 kg     Palliative Assessment/Data: PPS 40-50%     Time In/Out: 1530-1600/1630-1800 Time Total: 120 minutes  Greater than 50%  of this time was spent counseling and coordinating care related to the above assessment and plan.  Signed by: Lin Landsman, NP   Please contact Palliative Medicine Team phone at [phone] for questions and concerns.  For individual provider: See Shea Evans

## 2021-10-12 NOTE — Progress Notes (Signed)
Brief Palliative Medicine Progress Note:  PMT consult received and chart reviewed. Full GOC completed with son/HCPOA #2/Del, daughter in law/[name], granddaughter/[name], grandson/[name], son/[name] - full note to follow.  Recommendations/Plan: Continue current medical treatment with watchful waiting for 24-48 hours Now DNR/DNI Family considering possibility of patient discharging home with hospice and private duty caregiver support [facility] consulted for: family's request for private duty caregiver information Family requested another meeting with PMT tomorrow 2/7 - scheduled for 1:30pm PMT will continue to follow and support holistically  Thank you for allowing PMT to assist in the care of this patient.  Simi Briel M. Tamala Julian [facility] Palliative Medicine Team Team Phone: [phone] NO CHARGE

## 2021-10-12 NOTE — Progress Notes (Signed)
Patient unavailable for EEG.  He is at [employer].  Per RN, check again in about 45 minutes for availability.

## 2021-10-12 NOTE — ED Notes (Signed)
Ortho PA at bedside.  

## 2021-10-12 NOTE — Progress Notes (Signed)
Per RN, patient will be relocating to [facility] shortly.  She asked that EEG be performed when he arrives on [facility].

## 2021-10-12 NOTE — Assessment & Plan Note (Addendum)
-  per neurosurgery, on Keppra, Depakote

## 2021-10-12 NOTE — Assessment & Plan Note (Addendum)
-  most recent Cr 2.77 in 2020.  -improved to 2.84 -avoid nephrotoxic agents

## 2021-10-12 NOTE — Progress Notes (Signed)
EEG complete - results pending 

## 2021-10-12 NOTE — ED Triage Notes (Signed)
Witnessed syncopal episode at home while walking in the hallway per wife. Unconscious for several minutes. Pt has dementia with baseline confusion. Complains of left shoulder pain. Pt has some bruising to face and left eye noted. C-collar in place. Not on blood thinners.

## 2021-10-12 NOTE — ED Notes (Signed)
Trauma at bedside.

## 2021-10-12 NOTE — Consult Note (Signed)
Reason for Consult:Left humerus fx Referring Physician: Sherwood Gambler Time called: [id number] Time at bedside:1429    Samuel [name] is an 88 y.o. male.  HPI: Samuel [name] had a seizure today (likely from DM) and fell. He had significant left shoulder pain. He was brought to the ED where x-rays showed a humeral neck fx and orthopedic surgery was consulted. He is RHD and lives at home.  Past Medical History:  Diagnosis Date   BPH (benign prostatic hyperplasia)    Bruise    LEFT BACK/SHOULDER AREA   Chronic kidney disease (CKD), stage III (moderate) (HCC) BASE CRE  1.89  -- 2.2   NEPHROLOGIST-- [name]--  LOV DEC 2013  W/ CHART   Diabetes mellitus    PCP--  DR TISOVEC   History of kidney stones    Hypercholesteremia    Hyperparathyroidism, secondary ([place])    Hypertension    Iron deficiency anemia    Left shoulder strain    PT FELL ON ICE 11-06-2012    Past Surgical History:  Procedure Laterality Date   BLEPHAROPLASTY Bilateral 04-02-2005   CYSTO/ LEFT URETERAL STENT PLACEMENT  02-07-2003   CYSTO/ RIGHT URETER BALLOON DILATION/ STONE EXTRACTION  02-20-2003   GREEN LIGHT LASER TURP (TRANSURETHRAL RESECTION OF PROSTATE N/A 11/24/2012   Procedure: GREEN LIGHT LASER TURP (TRANSURETHRAL RESECTION OF PROSTATE;  Surgeon: [name], MD;  Location: [facility];  Service: Urology;  Laterality: N/A;   LEFT URETEROSCOPIC STONE EXTRACTION  02-14-2003    No family history on file.  Social History:  reports that he quit smoking about 28 years ago. His smoking use included cigarettes. He has a 160.00 pack-year smoking history. He has never used smokeless tobacco. He reports that he does not drink alcohol and does not use drugs.  Allergies:  Allergies  Allergen Reactions   Codeine Itching   Penicillins Itching   Sulfa Antibiotics Other (See Comments)    Unknown reaction    Medications: I have reviewed the patient's current medications.  Results for orders placed  or performed during the hospital encounter of 10/25/2021 (from the past 48 hour(s))  CBG monitoring, ED     Status: Abnormal   Collection Time: 10/14/2021 12:15 PM  Result Value Ref Range   Glucose-Capillary 143 (H) 70 - 99 mg/dL    Comment: Glucose reference range applies only to samples taken after fasting for at least 8 hours.  Comprehensive metabolic panel     Status: Abnormal   Collection Time: 10/15/2021 12:23 PM  Result Value Ref Range   Sodium 142 135 - 145 mmol/L   Potassium 4.8 3.5 - 5.1 mmol/L   Chloride 108 98 - 111 mmol/L   CO2 19 (L) 22 - 32 mmol/L   Glucose, Bld 158 (H) 70 - 99 mg/dL    Comment: Glucose reference range applies only to samples taken after fasting for at least 8 hours.   BUN 35 (H) 8 - 23 mg/dL   Creatinine, Ser 3.19 (H) 0.61 - 1.24 mg/dL   Calcium 8.5 (L) 8.9 - 10.3 mg/dL   Total Protein 6.1 (L) 6.5 - 8.1 g/dL   Albumin 3.3 (L) 3.5 - 5.0 g/dL   AST 19 15 - 41 U/L   ALT 14 0 - 44 U/L   Alkaline Phosphatase 64 38 - 126 U/L   Total Bilirubin 0.9 0.3 - 1.2 mg/dL   GFR, Estimated 18 (L) >60 mL/min    Comment: (NOTE) Calculated using the CKD-EPI Creatinine Equation (2021)  Anion gap 15 5 - 15    Comment: Performed at [facility] [address] [place], [place] [address]  CBC     Status: Abnormal   Collection Time: 10/10/2021 12:23 PM  Result Value Ref Range   WBC 11.0 (H) 4.0 - 10.5 K/uL   RBC 3.15 (L) 4.22 - 5.81 MIL/uL   Hemoglobin 10.1 (L) 13.0 - 17.0 g/dL   HCT 31.3 (L) 39.0 - 52.0 %   MCV 99.4 80.0 - 100.0 fL   MCH 32.1 26.0 - 34.0 pg   MCHC 32.3 30.0 - 36.0 g/dL   RDW 14.8 11.5 - 15.5 %   Platelets 156 150 - 400 K/uL   nRBC 0.0 0.0 - 0.2 %    Comment: Performed at [facility], [place] [address], [place], [place] [address]    DG Chest 1 View  Result Date: 11/02/2021 CLINICAL DATA:  Fall, witness syncopal episode at home while walking in hallway, unconscious for several minutes, history of dementia with baseline confusion, complaining  of LEFT shoulder pain EXAM: CHEST  1 VIEW COMPARISON:  03/20/2019 FINDINGS: Normal heart size, mediastinal contours, and pulmonary vascularity. Atherosclerotic calcification aorta. Chronic RIGHT basilar subsegmental atelectasis. No acute infiltrate, pleural effusion, or pneumothorax. Osseous demineralization with displaced fracture at surgical neck of LEFT humerus. IMPRESSION: RIGHT basilar atelectasis. Displaced fracture at surgical neck LEFT humerus. Aortic Atherosclerosis (ICD10-I70.0). Electronically Signed   By: Lavonia Dana M.D.   On: 11/02/2021 13:05   CT Head Wo Contrast  Result Date: 10/22/2021 CLINICAL DATA:  Head trauma, minor (Age >= 65y); Facial trauma, blunt EXAM: CT HEAD WITHOUT CONTRAST CT MAXILLOFACIAL WITHOUT CONTRAST TECHNIQUE: Multidetector CT imaging of the head and maxillofacial structures were performed using the standard protocol without intravenous contrast. Multiplanar CT image reconstructions of the maxillofacial structures were also generated. RADIATION DOSE REDUCTION: This exam was performed according to the departmental dose-optimization program which includes automated exposure control, adjustment of the mA and/or kV according to patient size and/or use of iterative reconstruction technique. COMPARISON:  None. FINDINGS: CT HEAD FINDINGS Brain: Small volume of acute subarachnoid hemorrhage overlying bilateral frontal convexities and the right temporal convexity. Additional small (4 mm thick) spine acute probably extra-axial hemorrhage along the left frontal convexity (favor subdural over epidural). Trace intraventricular hemorrhage layering in the left occipital lobe. No evidence of acute hemorrhage, mass effect, midline shift, mass lesion. Vascular: Calcific atherosclerosis. No hyperdense vessel identified. Skull: No evidence of acute fracture. Other: No mastoid effusions. CT MAXILLOFACIAL FINDINGS Motion limited.  Within this limitation Osseous: No fracture or mandibular  dislocation. No destructive process. Orbits: Negative. No traumatic or inflammatory finding. Sinuses: Mild paranasal sinus mucosal thickening. No air-fluid levels. Soft tissues: Negative. IMPRESSION: 1. Multifocal, multi compartment hemorrhage including small volume of bifrontal and right temporal subarachnoid hemorrhage, additional left frontal extra-axial hemorrhage (favor subdural over epidural), and trace intraventricular hemorrhage. 2. Motion limited maxillofacial CT without evidence of acute fracture. Findings discussed with Dr. Verner Chol via telephone at 1:40 p.m. Electronically Signed   By: Margaretha Sheffield M.D.   On: 10/25/2021 13:43   CT Cervical Spine Wo Contrast  Result Date: 10/11/2021 CLINICAL DATA:  Neck trauma, fall, loss of consciousness, bruising to LEFT eye and LEFT side of face, EXAM: CT CERVICAL SPINE WITHOUT CONTRAST TECHNIQUE: Multidetector CT imaging of the cervical spine was performed without intravenous contrast. Multiplanar CT image reconstructions were also generated. RADIATION DOSE REDUCTION: This exam was performed according to the departmental dose-optimization program which includes  automated exposure control, adjustment of the mA and/or kV according to patient size and/or use of iterative reconstruction technique. COMPARISON:  11/06/2012 FINDINGS: Alignment: Normal Skull base and vertebrae: Osseous demineralization. Skull base intact. Head rotated on exam. Multilevel facet degenerative changes. Disc space narrowing and endplate spur formation at C5-C7. No fracture, subluxation, or bone destruction. Uncovertebral spurs encroach upon cervical neural foramina bilaterally at C5-C6. Bone island at T7 vertebral body LEFT of midline unchanged. Soft tissues and spinal canal: Prevertebral soft tissues normal thickness. Atherosclerotic calcification of internal carotid, vertebral, and common carotid arteries bilaterally including carotid bifurcations. No proximal great vessel  atherosclerotic calcifications. Air-filled cervical esophagus and upper thorax. Disc levels:  No specific abnormalities Upper chest: Small subpleural lipoma at LEFT apex, 3.3 x 2.3 cm. Lung apices otherwise clear. Other: N/A IMPRESSION: Multilevel degenerative disc and facet disease changes of the cervical spine. No acute cervical spine abnormalities. Subpleural lipoma LEFT apex 3.3 x 2.3 cm in size. Extensive atherosclerotic calcifications. Electronically Signed   By: Lavonia Dana M.D.   On: 10/27/2021 13:46   DG Shoulder Left  Result Date: 10/24/2021 CLINICAL DATA:  Witnessed syncopal episode at home walking in the hallway. Fall. Unconscious for several minutes. Left shoulder pain. EXAM: LEFT SHOULDER - 2+ VIEW COMPARISON:  Left shoulder radiographs 11/06/2012 FINDINGS: There is a new oblique fracture of the left proximal humerus surgical neck extending through the proximal diaphysis in a proximal-lateral to distal-medial orientation. Mild anterior apex angulation of the fracture line on lateral transscapular Y-view. Minimal medial apex angulation of the fracture on frontal view. Mild glenohumeral and acromioclavicular osteoarthritis. The visualized left lung is unremarkable. IMPRESSION: Mildly angulated proximal left humeral surgical neck and diaphyseal fracture. Electronically Signed   By: Yvonne Kendall M.D.   On: 10/15/2021 13:05   CT Maxillofacial Wo Contrast  Result Date: 10/29/2021 CLINICAL DATA:  Head trauma, minor (Age >= 65y); Facial trauma, blunt EXAM: CT HEAD WITHOUT CONTRAST CT MAXILLOFACIAL WITHOUT CONTRAST TECHNIQUE: Multidetector CT imaging of the head and maxillofacial structures were performed using the standard protocol without intravenous contrast. Multiplanar CT image reconstructions of the maxillofacial structures were also generated. RADIATION DOSE REDUCTION: This exam was performed according to the departmental dose-optimization program which includes automated exposure control,  adjustment of the mA and/or kV according to patient size and/or use of iterative reconstruction technique. COMPARISON:  None. FINDINGS: CT HEAD FINDINGS Brain: Small volume of acute subarachnoid hemorrhage overlying bilateral frontal convexities and the right temporal convexity. Additional small (4 mm thick) spine acute probably extra-axial hemorrhage along the left frontal convexity (favor subdural over epidural). Trace intraventricular hemorrhage layering in the left occipital lobe. No evidence of acute hemorrhage, mass effect, midline shift, mass lesion. Vascular: Calcific atherosclerosis. No hyperdense vessel identified. Skull: No evidence of acute fracture. Other: No mastoid effusions. CT MAXILLOFACIAL FINDINGS Motion limited.  Within this limitation Osseous: No fracture or mandibular dislocation. No destructive process. Orbits: Negative. No traumatic or inflammatory finding. Sinuses: Mild paranasal sinus mucosal thickening. No air-fluid levels. Soft tissues: Negative. IMPRESSION: 1. Multifocal, multi compartment hemorrhage including small volume of bifrontal and right temporal subarachnoid hemorrhage, additional left frontal extra-axial hemorrhage (favor subdural over epidural), and trace intraventricular hemorrhage. 2. Motion limited maxillofacial CT without evidence of acute fracture. Findings discussed with Dr. Verner Chol via telephone at 1:40 p.m. Electronically Signed   By: Margaretha Sheffield M.D.   On: 10/25/2021 13:43    Review of Systems  HENT:  Negative for ear discharge, ear pain, hearing loss and tinnitus.  Eyes:  Negative for photophobia and pain.  Respiratory:  Negative for cough and shortness of breath.   Cardiovascular:  Negative for chest pain.  Gastrointestinal:  Negative for abdominal pain, nausea and vomiting.  Genitourinary:  Negative for dysuria, flank pain, frequency and urgency.  Musculoskeletal:  Positive for arthralgias (Left shoulder). Negative for back pain, myalgias and neck  pain.  Neurological:  Negative for dizziness and headaches.  Hematological:  Does not bruise/bleed easily.  Psychiatric/Behavioral:  The patient is not nervous/anxious.   Blood pressure (!) 150/52, pulse 95, resp. rate 18, height 5\' 4"  (1.626 m), weight 66.7 kg, SpO2 100 %. Physical Exam Constitutional:      General: He is not in acute distress.    Appearance: He is well-developed. He is not diaphoretic.  HENT:     Head: Normocephalic and atraumatic.  Eyes:     General: No scleral icterus.       Right eye: No discharge.        Left eye: No discharge.     Conjunctiva/sclera: Conjunctivae normal.  Neck:     Comments: C-collar Cardiovascular:     Rate and Rhythm: Normal rate and regular rhythm.  Pulmonary:     Effort: Pulmonary effort is normal. No respiratory distress.  Musculoskeletal:     Comments: Left shoulder, elbow, wrist, digits- no skin wounds, mod TTP shoulder, no instability, no blocks to motion  Sens  Ax/R/M/U intact  Mot   Ax/ R/ PIN/ M/ AIN/ U intact  Rad 2+  Skin:    General: Skin is warm and dry.  Neurological:     Mental Status: He is alert.  Psychiatric:        Mood and Affect: Mood normal.        Behavior: Behavior normal.    Assessment/Plan: Left humerus fx -- Plan non-operative management with sling and NWB. F/u with Dr. Doreatha Martin in 2 weeks.    Lisette Abu, PA-C Orthopedic Surgery [phone] 10/10/2021, 2:37 PM
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
## 2021-10-12 NOTE — Procedures (Signed)
This is a routine inpatient EEG performed on: October 12, 2021  Clinical indication: Altered mental status   History: The patient is an 87 year old man with a history of diabetes dementia with behavioral disturbance chronic kidney disease and hypertension.  He has had altered mental status. Introduction: This is a routine inpatient EEG performed using the standard 10-20 system of electrode placement with 21 channels of EEG and a single channel of EKG monitoring.  Approximately  22:32 minutes of EEG captured.  Photic stimulation and hyperventilation were not performed.   Description of the record:  The study is significantly technically limited by movement and EMG artifact throughout the entirety of the recording. The awake background is continuous and symmetric characterized by a probable 7-8  hertz posterior dominant rhythm.   No stage II sleep architecture seen.  No definite  seizures, epileptiform discharges, or evolving ictal patterns seen.    The single-channel EKG shows a heart rate in the 100 BPM range   Impression: Technically limited study due to significant EMG artifact throughout the recording which limits interpretation.  Likely mild to moderate generalized slowing of the background consistent with a mild to moderate degree of diffuse or multifocal cerebral dysfunction.  No definite seizures, epileptiform discharges, or evolving ictal patterns within the limitations of interpretation.

## 2021-10-12 NOTE — Assessment & Plan Note (Signed)
-  Continue Lipitor °

## 2021-10-12 NOTE — H&P (View-Only) (Signed)
Reason for Consult:Left humerus fx Referring Physician: Sherwood Gambler Time called: [id number] Time at bedside:1429    Samuel [name] is an 88 y.o. male.  HPI: Samuel [name] had a seizure today (likely from DM) and fell. He had significant left shoulder pain. He was brought to the ED where x-rays showed a humeral neck fx and orthopedic surgery was consulted. He is RHD and lives at home.  Past Medical History:  Diagnosis Date   BPH (benign prostatic hyperplasia)    Bruise    LEFT BACK/SHOULDER AREA   Chronic kidney disease (CKD), stage III (moderate) (HCC) BASE CRE  1.89  -- 2.2   NEPHROLOGIST-- [name]--  LOV DEC 2013  W/ CHART   Diabetes mellitus    PCP--  DR TISOVEC   History of kidney stones    Hypercholesteremia    Hyperparathyroidism, secondary ([place])    Hypertension    Iron deficiency anemia    Left shoulder strain    PT FELL ON ICE 11-06-2012    Past Surgical History:  Procedure Laterality Date   BLEPHAROPLASTY Bilateral 04-02-2005   CYSTO/ LEFT URETERAL STENT PLACEMENT  02-07-2003   CYSTO/ RIGHT URETER BALLOON DILATION/ STONE EXTRACTION  02-20-2003   GREEN LIGHT LASER TURP (TRANSURETHRAL RESECTION OF PROSTATE N/A 11/24/2012   Procedure: GREEN LIGHT LASER TURP (TRANSURETHRAL RESECTION OF PROSTATE;  Surgeon: [name], MD;  Location: [facility];  Service: Urology;  Laterality: N/A;   LEFT URETEROSCOPIC STONE EXTRACTION  02-14-2003    No family history on file.  Social History:  reports that he quit smoking about 28 years ago. His smoking use included cigarettes. He has a 160.00 pack-year smoking history. He has never used smokeless tobacco. He reports that he does not drink alcohol and does not use drugs.  Allergies:  Allergies  Allergen Reactions   Codeine Itching   Penicillins Itching   Sulfa Antibiotics Other (See Comments)    Unknown reaction    Medications: I have reviewed the patient's current medications.  Results for orders placed  or performed during the hospital encounter of 10/11/2021 (from the past 48 hour(s))  CBG monitoring, ED     Status: Abnormal   Collection Time: 10/08/2021 12:15 PM  Result Value Ref Range   Glucose-Capillary 143 (H) 70 - 99 mg/dL    Comment: Glucose reference range applies only to samples taken after fasting for at least 8 hours.  Comprehensive metabolic panel     Status: Abnormal   Collection Time: 10/25/2021 12:23 PM  Result Value Ref Range   Sodium 142 135 - 145 mmol/L   Potassium 4.8 3.5 - 5.1 mmol/L   Chloride 108 98 - 111 mmol/L   CO2 19 (L) 22 - 32 mmol/L   Glucose, Bld 158 (H) 70 - 99 mg/dL    Comment: Glucose reference range applies only to samples taken after fasting for at least 8 hours.   BUN 35 (H) 8 - 23 mg/dL   Creatinine, Ser 3.19 (H) 0.61 - 1.24 mg/dL   Calcium 8.5 (L) 8.9 - 10.3 mg/dL   Total Protein 6.1 (L) 6.5 - 8.1 g/dL   Albumin 3.3 (L) 3.5 - 5.0 g/dL   AST 19 15 - 41 U/L   ALT 14 0 - 44 U/L   Alkaline Phosphatase 64 38 - 126 U/L   Total Bilirubin 0.9 0.3 - 1.2 mg/dL   GFR, Estimated 18 (L) >60 mL/min    Comment: (NOTE) Calculated using the CKD-EPI Creatinine Equation (2021)  Anion gap 15 5 - 15    Comment: Performed at [facility] [address], [place], [place] [address]  CBC     Status: Abnormal   Collection Time: 10/14/2021 12:23 PM  Result Value Ref Range   WBC 11.0 (H) 4.0 - 10.5 K/uL   RBC 3.15 (L) 4.22 - 5.81 MIL/uL   Hemoglobin 10.1 (L) 13.0 - 17.0 g/dL   HCT 31.3 (L) 39.0 - 52.0 %   MCV 99.4 80.0 - 100.0 fL   MCH 32.1 26.0 - 34.0 pg   MCHC 32.3 30.0 - 36.0 g/dL   RDW 14.8 11.5 - 15.5 %   Platelets 156 150 - 400 K/uL   nRBC 0.0 0.0 - 0.2 %    Comment: Performed at [facility], [place] [address] [place], [place] [address]    DG Chest 1 View  Result Date: 10/25/2021 CLINICAL DATA:  Fall, witness syncopal episode at home while walking in hallway, unconscious for several minutes, history of dementia with baseline confusion, complaining  of LEFT shoulder pain EXAM: CHEST  1 VIEW COMPARISON:  03/20/2019 FINDINGS: Normal heart size, mediastinal contours, and pulmonary vascularity. Atherosclerotic calcification aorta. Chronic RIGHT basilar subsegmental atelectasis. No acute infiltrate, pleural effusion, or pneumothorax. Osseous demineralization with displaced fracture at surgical neck of LEFT humerus. IMPRESSION: RIGHT basilar atelectasis. Displaced fracture at surgical neck LEFT humerus. Aortic Atherosclerosis (ICD10-I70.0). Electronically Signed   By: Lavonia Dana M.D.   On: 10/11/2021 13:05   CT Head Wo Contrast  Result Date: 10/20/2021 CLINICAL DATA:  Head trauma, minor (Age >= 65y); Facial trauma, blunt EXAM: CT HEAD WITHOUT CONTRAST CT MAXILLOFACIAL WITHOUT CONTRAST TECHNIQUE: Multidetector CT imaging of the head and maxillofacial structures were performed using the standard protocol without intravenous contrast. Multiplanar CT image reconstructions of the maxillofacial structures were also generated. RADIATION DOSE REDUCTION: This exam was performed according to the departmental dose-optimization program which includes automated exposure control, adjustment of the mA and/or kV according to patient size and/or use of iterative reconstruction technique. COMPARISON:  None. FINDINGS: CT HEAD FINDINGS Brain: Small volume of acute subarachnoid hemorrhage overlying bilateral frontal convexities and the right temporal convexity. Additional small (4 mm thick) spine acute probably extra-axial hemorrhage along the left frontal convexity (favor subdural over epidural). Trace intraventricular hemorrhage layering in the left occipital lobe. No evidence of acute hemorrhage, mass effect, midline shift, mass lesion. Vascular: Calcific atherosclerosis. No hyperdense vessel identified. Skull: No evidence of acute fracture. Other: No mastoid effusions. CT MAXILLOFACIAL FINDINGS Motion limited.  Within this limitation Osseous: No fracture or mandibular  dislocation. No destructive process. Orbits: Negative. No traumatic or inflammatory finding. Sinuses: Mild paranasal sinus mucosal thickening. No air-fluid levels. Soft tissues: Negative. IMPRESSION: 1. Multifocal, multi compartment hemorrhage including small volume of bifrontal and right temporal subarachnoid hemorrhage, additional left frontal extra-axial hemorrhage (favor subdural over epidural), and trace intraventricular hemorrhage. 2. Motion limited maxillofacial CT without evidence of acute fracture. Findings discussed with Dr. Verner Chol via telephone at 1:40 p.m. Electronically Signed   By: Margaretha Sheffield M.D.   On: 10/29/2021 13:43   CT Cervical Spine Wo Contrast  Result Date: 10/15/2021 CLINICAL DATA:  Neck trauma, fall, loss of consciousness, bruising to LEFT eye and LEFT side of face, EXAM: CT CERVICAL SPINE WITHOUT CONTRAST TECHNIQUE: Multidetector CT imaging of the cervical spine was performed without intravenous contrast. Multiplanar CT image reconstructions were also generated. RADIATION DOSE REDUCTION: This exam was performed according to the departmental dose-optimization program which includes  automated exposure control, adjustment of the mA and/or kV according to patient size and/or use of iterative reconstruction technique. COMPARISON:  11/06/2012 FINDINGS: Alignment: Normal Skull base and vertebrae: Osseous demineralization. Skull base intact. Head rotated on exam. Multilevel facet degenerative changes. Disc space narrowing and endplate spur formation at C5-C7. No fracture, subluxation, or bone destruction. Uncovertebral spurs encroach upon cervical neural foramina bilaterally at C5-C6. Bone island at T7 vertebral body LEFT of midline unchanged. Soft tissues and spinal canal: Prevertebral soft tissues normal thickness. Atherosclerotic calcification of internal carotid, vertebral, and common carotid arteries bilaterally including carotid bifurcations. No proximal great vessel  atherosclerotic calcifications. Air-filled cervical esophagus and upper thorax. Disc levels:  No specific abnormalities Upper chest: Small subpleural lipoma at LEFT apex, 3.3 x 2.3 cm. Lung apices otherwise clear. Other: N/A IMPRESSION: Multilevel degenerative disc and facet disease changes of the cervical spine. No acute cervical spine abnormalities. Subpleural lipoma LEFT apex 3.3 x 2.3 cm in size. Extensive atherosclerotic calcifications. Electronically Signed   By: Lavonia Dana M.D.   On: 10/08/2021 13:46   DG Shoulder Left  Result Date: 10/17/2021 CLINICAL DATA:  Witnessed syncopal episode at home walking in the hallway. Fall. Unconscious for several minutes. Left shoulder pain. EXAM: LEFT SHOULDER - 2+ VIEW COMPARISON:  Left shoulder radiographs 11/06/2012 FINDINGS: There is a new oblique fracture of the left proximal humerus surgical neck extending through the proximal diaphysis in a proximal-lateral to distal-medial orientation. Mild anterior apex angulation of the fracture line on lateral transscapular Y-view. Minimal medial apex angulation of the fracture on frontal view. Mild glenohumeral and acromioclavicular osteoarthritis. The visualized left lung is unremarkable. IMPRESSION: Mildly angulated proximal left humeral surgical neck and diaphyseal fracture. Electronically Signed   By: Yvonne Kendall M.D.   On: 10/31/2021 13:05   CT Maxillofacial Wo Contrast  Result Date: 10/07/2021 CLINICAL DATA:  Head trauma, minor (Age >= 65y); Facial trauma, blunt EXAM: CT HEAD WITHOUT CONTRAST CT MAXILLOFACIAL WITHOUT CONTRAST TECHNIQUE: Multidetector CT imaging of the head and maxillofacial structures were performed using the standard protocol without intravenous contrast. Multiplanar CT image reconstructions of the maxillofacial structures were also generated. RADIATION DOSE REDUCTION: This exam was performed according to the departmental dose-optimization program which includes automated exposure control,  adjustment of the mA and/or kV according to patient size and/or use of iterative reconstruction technique. COMPARISON:  None. FINDINGS: CT HEAD FINDINGS Brain: Small volume of acute subarachnoid hemorrhage overlying bilateral frontal convexities and the right temporal convexity. Additional small (4 mm thick) spine acute probably extra-axial hemorrhage along the left frontal convexity (favor subdural over epidural). Trace intraventricular hemorrhage layering in the left occipital lobe. No evidence of acute hemorrhage, mass effect, midline shift, mass lesion. Vascular: Calcific atherosclerosis. No hyperdense vessel identified. Skull: No evidence of acute fracture. Other: No mastoid effusions. CT MAXILLOFACIAL FINDINGS Motion limited.  Within this limitation Osseous: No fracture or mandibular dislocation. No destructive process. Orbits: Negative. No traumatic or inflammatory finding. Sinuses: Mild paranasal sinus mucosal thickening. No air-fluid levels. Soft tissues: Negative. IMPRESSION: 1. Multifocal, multi compartment hemorrhage including small volume of bifrontal and right temporal subarachnoid hemorrhage, additional left frontal extra-axial hemorrhage (favor subdural over epidural), and trace intraventricular hemorrhage. 2. Motion limited maxillofacial CT without evidence of acute fracture. Findings discussed with Dr. Verner Chol via telephone at 1:40 p.m. Electronically Signed   By: Margaretha Sheffield M.D.   On: 10/11/2021 13:43    Review of Systems  HENT:  Negative for ear discharge, ear pain, hearing loss and tinnitus.  Eyes:  Negative for photophobia and pain.  Respiratory:  Negative for cough and shortness of breath.   Cardiovascular:  Negative for chest pain.  Gastrointestinal:  Negative for abdominal pain, nausea and vomiting.  Genitourinary:  Negative for dysuria, flank pain, frequency and urgency.  Musculoskeletal:  Positive for arthralgias (Left shoulder). Negative for back pain, myalgias and neck  pain.  Neurological:  Negative for dizziness and headaches.  Hematological:  Does not bruise/bleed easily.  Psychiatric/Behavioral:  The patient is not nervous/anxious.   Blood pressure (!) 150/52, pulse 95, resp. rate 18, height 5\' 4"  (1.626 m), weight 66.7 kg, SpO2 100 %. Physical Exam Constitutional:      General: He is not in acute distress.    Appearance: He is well-developed. He is not diaphoretic.  HENT:     Head: Normocephalic and atraumatic.  Eyes:     General: No scleral icterus.       Right eye: No discharge.        Left eye: No discharge.     Conjunctiva/sclera: Conjunctivae normal.  Neck:     Comments: C-collar Cardiovascular:     Rate and Rhythm: Normal rate and regular rhythm.  Pulmonary:     Effort: Pulmonary effort is normal. No respiratory distress.  Musculoskeletal:     Comments: Left shoulder, elbow, wrist, digits- no skin wounds, mod TTP shoulder, no instability, no blocks to motion  Sens  Ax/R/M/U intact  Mot   Ax/ R/ PIN/ M/ AIN/ U intact  Rad 2+  Skin:    General: Skin is warm and dry.  Neurological:     Mental Status: He is alert.  Psychiatric:        Mood and Affect: Mood normal.        Behavior: Behavior normal.    Assessment/Plan: Left humerus fx -- Plan non-operative management with sling and NWB. F/u with Dr. Doreatha Martin in 2 weeks.    Lisette Abu, PA-C Orthopedic Surgery [phone] 10/15/2021, 2:37 PM

## 2021-10-12 NOTE — ED Provider Notes (Addendum)
[facility] EMERGENCY DEPARTMENT Provider Note   CSN: [id number] Arrival date & time: 10/17/2021  1154     History  Chief Complaint  Patient presents with   Loss of Consciousness    Samuel [name] is a 88 y.o. male who presents to the emergency department after concern for a seizure versus syncopal episode earlier today.  Patient lives with his wife and son.  They report that he was walking through the hall, when he fell over.  They are not sure if he was shaking at the time, but he was incontinent of both bowel and bladder.  He does have a history of dementia with behavioral disturbance and has been on Depakote for this.  He has no history of seizures.  Son says that he seemed to be restless this morning, but has otherwise been doing well.  They state that he was unconscious for several minutes.  Patient is complaining of left shoulder pain as well as pain of the left side of his face.  He is not on chronic anticoagulation.   Loss of Consciousness Associated symptoms: seizures and weakness      Past Medical History:  Diagnosis Date   BPH (benign prostatic hyperplasia)    Bruise    LEFT BACK/SHOULDER AREA   Chronic kidney disease (CKD), stage III (moderate) (HCC) BASE CRE  1.89  -- 2.2   NEPHROLOGIST-- [name]--  LOV DEC 2013  W/ CHART   Diabetes mellitus    PCP--  DR TISOVEC   History of kidney stones    Hypercholesteremia    Hyperparathyroidism, secondary ([place])    Hypertension    Iron deficiency anemia    Left shoulder strain    PT FELL ON ICE 11-06-2012     Home Medications Prior to Admission medications   Medication Sig Start Date End Date Taking? Authorizing Provider  allopurinol (ZYLOPRIM) 300 MG tablet Take 300 mg by mouth at bedtime. 03/07/19  Yes [provider]  amLODipine (NORVASC) 10 MG tablet Take 10 mg by mouth every morning.   Yes [provider]  atorvastatin (LIPITOR) 20 MG tablet Take 20 mg by mouth every morning.    Yes [provider]  divalproex (DEPAKOTE) 500 MG DR tablet Take 500 mg by mouth every evening. 04/04/19  Yes [provider]  donepezil (ARICEPT) 10 MG tablet TAKE 1 TABLET BY MOUTH EVERY DAY Patient taking differently: Take 10 mg by mouth at bedtime. 04/14/21  Yes Rondel Jumbo, PA-C  furosemide (LASIX) 20 MG tablet Take 20 mg by mouth See admin instructions. Take one tablet (20 mg) by mouth every morning, take an additional tablet (20 mg) at noon on Monday, Wednesday, Friday   Yes [provider]  gabapentin (NEURONTIN) 100 MG capsule Take 1 capsule (100 mg total) by mouth at bedtime. 04/14/21  Yes Rondel Jumbo, PA-C  glimepiride (AMARYL) 1 MG tablet Take 1 mg by mouth every morning. 10/08/21  Yes [provider]  ipratropium (ATROVENT) 0.06 % nasal spray Place 2 sprays into both nostrils 3 (three) times daily as needed for rhinitis (congestion).   Yes [provider]  memantine (NAMENDA) 10 MG tablet TAKE 1 TABLET BY MOUTH TWICE A DAY Patient taking differently: Take 10 mg by mouth 2 (two) times daily. 04/14/21  Yes Rondel Jumbo, PA-C  mirtazapine (REMERON) 15 MG tablet Take 1 tab at night Patient taking differently: Take 15 mg by mouth at bedtime. 04/14/21  Yes Sharene Butters  E, PA-C  sitaGLIPtin (JANUVIA) 50 MG tablet Take 50 mg by mouth every morning.    Yes [provider]  tamsulosin (FLOMAX) 0.4 MG CAPS Take 0.4 mg by mouth every evening.   Yes [provider]  vitamin B-12 (CYANOCOBALAMIN) 1000 MCG tablet Take 1,000 mcg by mouth every morning.    Yes [provider]      Allergies    Codeine, Penicillins, and Sulfa antibiotics    Review of Systems   Review of Systems  Cardiovascular:  Positive for syncope.  Genitourinary:        Incontinence  Neurological:  Positive for seizures, syncope and weakness.  All other systems reviewed and are negative.  Physical Exam Updated Vital Signs BP (!) 153/51    Pulse (!)  101    Resp (!) 21    Ht 5\' 4"  (1.626 m)    Wt 66.7 kg    SpO2 97%    BMI 25.24 kg/m  Physical Exam Vitals and nursing note reviewed.  Constitutional:      Appearance: Normal appearance.  HENT:     Head: Normocephalic and atraumatic.  Eyes:     Conjunctiva/sclera: Conjunctivae normal.     Comments: Bruising noted around the left eye  Neck:     Comments: In c-collar Cardiovascular:     Rate and Rhythm: Normal rate and regular rhythm.  Pulmonary:     Effort: Pulmonary effort is normal. No respiratory distress.     Breath sounds: Normal breath sounds.  Chest:     Comments: Chest wall stable Abdominal:     General: Abdomen is flat. There is no distension.     Palpations: Abdomen is soft.     Tenderness: There is no abdominal tenderness.  Skin:    General: Skin is warm and dry.  Neurological:     General: No focal deficit present.     Mental Status: He is alert.    ED Results / Procedures / Treatments   Labs (all labs ordered are listed, but only abnormal results are displayed) Labs Reviewed  COMPREHENSIVE METABOLIC PANEL - Abnormal; Notable for the following components:      Result Value   CO2 19 (*)    Glucose, Bld 158 (*)    BUN 35 (*)    Creatinine, Ser 3.19 (*)    Calcium 8.5 (*)    Total Protein 6.1 (*)    Albumin 3.3 (*)    GFR, Estimated 18 (*)    All other components within normal limits  CBC - Abnormal; Notable for the following components:   WBC 11.0 (*)    RBC 3.15 (*)    Hemoglobin 10.1 (*)    HCT 31.3 (*)    All other components within normal limits  CBG MONITORING, ED - Abnormal; Notable for the following components:   Glucose-Capillary 143 (*)    All other components within normal limits  URINALYSIS, ROUTINE W REFLEX MICROSCOPIC    EKG None  Radiology DG Chest 1 View  Result Date: 10/21/2021 CLINICAL DATA:  Fall, witness syncopal episode at home while walking in hallway, unconscious for several minutes, history of dementia with baseline  confusion, complaining of LEFT shoulder pain EXAM: CHEST  1 VIEW COMPARISON:  03/20/2019 FINDINGS: Normal heart size, mediastinal contours, and pulmonary vascularity. Atherosclerotic calcification aorta. Chronic RIGHT basilar subsegmental atelectasis. No acute infiltrate, pleural effusion, or pneumothorax. Osseous demineralization with displaced fracture at surgical neck of LEFT humerus. IMPRESSION: RIGHT basilar atelectasis. Displaced fracture  at surgical neck LEFT humerus. Aortic Atherosclerosis (ICD10-I70.0). Electronically Signed   By: Lavonia Dana M.D.   On: 11/01/2021 13:05   CT Head Wo Contrast  Result Date: 10/15/2021 CLINICAL DATA:  Head trauma, minor (Age >= 65y); Facial trauma, blunt EXAM: CT HEAD WITHOUT CONTRAST CT MAXILLOFACIAL WITHOUT CONTRAST TECHNIQUE: Multidetector CT imaging of the head and maxillofacial structures were performed using the standard protocol without intravenous contrast. Multiplanar CT image reconstructions of the maxillofacial structures were also generated. RADIATION DOSE REDUCTION: This exam was performed according to the departmental dose-optimization program which includes automated exposure control, adjustment of the mA and/or kV according to patient size and/or use of iterative reconstruction technique. COMPARISON:  None. FINDINGS: CT HEAD FINDINGS Brain: Small volume of acute subarachnoid hemorrhage overlying bilateral frontal convexities and the right temporal convexity. Additional small (4 mm thick) spine acute probably extra-axial hemorrhage along the left frontal convexity (favor subdural over epidural). Trace intraventricular hemorrhage layering in the left occipital lobe. No evidence of acute hemorrhage, mass effect, midline shift, mass lesion. Vascular: Calcific atherosclerosis. No hyperdense vessel identified. Skull: No evidence of acute fracture. Other: No mastoid effusions. CT MAXILLOFACIAL FINDINGS Motion limited.  Within this limitation Osseous: No fracture or  mandibular dislocation. No destructive process. Orbits: Negative. No traumatic or inflammatory finding. Sinuses: Mild paranasal sinus mucosal thickening. No air-fluid levels. Soft tissues: Negative. IMPRESSION: 1. Multifocal, multi compartment hemorrhage including small volume of bifrontal and right temporal subarachnoid hemorrhage, additional left frontal extra-axial hemorrhage (favor subdural over epidural), and trace intraventricular hemorrhage. 2. Motion limited maxillofacial CT without evidence of acute fracture. Findings discussed with Dr. Verner Chol via telephone at 1:40 p.m. Electronically Signed   By: Margaretha Sheffield M.D.   On: 11/01/2021 13:43   CT Cervical Spine Wo Contrast  Result Date: 10/27/2021 CLINICAL DATA:  Neck trauma, fall, loss of consciousness, bruising to LEFT eye and LEFT side of face, EXAM: CT CERVICAL SPINE WITHOUT CONTRAST TECHNIQUE: Multidetector CT imaging of the cervical spine was performed without intravenous contrast. Multiplanar CT image reconstructions were also generated. RADIATION DOSE REDUCTION: This exam was performed according to the departmental dose-optimization program which includes automated exposure control, adjustment of the mA and/or kV according to patient size and/or use of iterative reconstruction technique. COMPARISON:  11/06/2012 FINDINGS: Alignment: Normal Skull base and vertebrae: Osseous demineralization. Skull base intact. Head rotated on exam. Multilevel facet degenerative changes. Disc space narrowing and endplate spur formation at C5-C7. No fracture, subluxation, or bone destruction. Uncovertebral spurs encroach upon cervical neural foramina bilaterally at C5-C6. Bone island at T7 vertebral body LEFT of midline unchanged. Soft tissues and spinal canal: Prevertebral soft tissues normal thickness. Atherosclerotic calcification of internal carotid, vertebral, and common carotid arteries bilaterally including carotid bifurcations. No proximal great vessel  atherosclerotic calcifications. Air-filled cervical esophagus and upper thorax. Disc levels:  No specific abnormalities Upper chest: Small subpleural lipoma at LEFT apex, 3.3 x 2.3 cm. Lung apices otherwise clear. Other: N/A IMPRESSION: Multilevel degenerative disc and facet disease changes of the cervical spine. No acute cervical spine abnormalities. Subpleural lipoma LEFT apex 3.3 x 2.3 cm in size. Extensive atherosclerotic calcifications. Electronically Signed   By: Lavonia Dana M.D.   On: 10/09/2021 13:46   DG Shoulder Left  Result Date: 10/23/2021 CLINICAL DATA:  Witnessed syncopal episode at home walking in the hallway. Fall. Unconscious for several minutes. Left shoulder pain. EXAM: LEFT SHOULDER - 2+ VIEW COMPARISON:  Left shoulder radiographs 11/06/2012 FINDINGS: There is a new oblique fracture of the left  proximal humerus surgical neck extending through the proximal diaphysis in a proximal-lateral to distal-medial orientation. Mild anterior apex angulation of the fracture line on lateral transscapular Y-view. Minimal medial apex angulation of the fracture on frontal view. Mild glenohumeral and acromioclavicular osteoarthritis. The visualized left lung is unremarkable. IMPRESSION: Mildly angulated proximal left humeral surgical neck and diaphyseal fracture. Electronically Signed   By: Yvonne Kendall M.D.   On: 10/07/2021 13:05   CT Maxillofacial Wo Contrast  Result Date: 10/23/2021 CLINICAL DATA:  Head trauma, minor (Age >= 65y); Facial trauma, blunt EXAM: CT HEAD WITHOUT CONTRAST CT MAXILLOFACIAL WITHOUT CONTRAST TECHNIQUE: Multidetector CT imaging of the head and maxillofacial structures were performed using the standard protocol without intravenous contrast. Multiplanar CT image reconstructions of the maxillofacial structures were also generated. RADIATION DOSE REDUCTION: This exam was performed according to the departmental dose-optimization program which includes automated exposure control,  adjustment of the mA and/or kV according to patient size and/or use of iterative reconstruction technique. COMPARISON:  None. FINDINGS: CT HEAD FINDINGS Brain: Small volume of acute subarachnoid hemorrhage overlying bilateral frontal convexities and the right temporal convexity. Additional small (4 mm thick) spine acute probably extra-axial hemorrhage along the left frontal convexity (favor subdural over epidural). Trace intraventricular hemorrhage layering in the left occipital lobe. No evidence of acute hemorrhage, mass effect, midline shift, mass lesion. Vascular: Calcific atherosclerosis. No hyperdense vessel identified. Skull: No evidence of acute fracture. Other: No mastoid effusions. CT MAXILLOFACIAL FINDINGS Motion limited.  Within this limitation Osseous: No fracture or mandibular dislocation. No destructive process. Orbits: Negative. No traumatic or inflammatory finding. Sinuses: Mild paranasal sinus mucosal thickening. No air-fluid levels. Soft tissues: Negative. IMPRESSION: 1. Multifocal, multi compartment hemorrhage including small volume of bifrontal and right temporal subarachnoid hemorrhage, additional left frontal extra-axial hemorrhage (favor subdural over epidural), and trace intraventricular hemorrhage. 2. Motion limited maxillofacial CT without evidence of acute fracture. Findings discussed with Dr. Verner Chol via telephone at 1:40 p.m. Electronically Signed   By: Margaretha Sheffield M.D.   On: 10/14/2021 13:43    Procedures .Critical Care Performed by: Kateri Plummer, PA-C Authorized by: Kateri Plummer, PA-C   Critical care provider statement:    Critical care time (minutes):  30   Critical care was necessary to treat or prevent imminent or life-threatening deterioration of the following conditions:  Trauma   Critical care was time spent personally by me on the following activities:  Development of treatment plan with patient or surrogate, discussions with consultants, evaluation  of patient's response to treatment, examination of patient, ordering and review of laboratory studies, ordering and review of radiographic studies, ordering and performing treatments and interventions, pulse oximetry, re-evaluation of patient's condition, review of old charts and obtaining history from patient or surrogate   Care discussed with: admitting provider      Medications Ordered in ED Medications  acetaminophen (TYLENOL) tablet 1,000 mg (has no administration in time range)  oxyCODONE (Oxy IR/ROXICODONE) immediate release tablet 2.5-5 mg (has no administration in time range)  morphine (PF) 2 MG/ML injection 1-2 mg (has no administration in time range)  docusate sodium (COLACE) capsule 100 mg (has no administration in time range)  ondansetron (ZOFRAN-ODT) disintegrating tablet 4 mg (has no administration in time range)    Or  ondansetron (ZOFRAN) injection 4 mg (has no administration in time range)  0.9 %  sodium chloride infusion (has no administration in time range)  levETIRAcetam (KEPPRA) IVPB 500 mg/100 mL premix (has no administration in time range)  allopurinol (ZYLOPRIM) tablet 300 mg (has no administration in time range)  atorvastatin (LIPITOR) tablet 20 mg (has no administration in time range)  amLODipine (NORVASC) tablet 10 mg (has no administration in time range)  divalproex (DEPAKOTE) DR tablet 500 mg (has no administration in time range)  donepezil (ARICEPT) tablet 10 mg (has no administration in time range)  gabapentin (NEURONTIN) capsule 100 mg (has no administration in time range)  glimepiride (AMARYL) tablet 1 mg (has no administration in time range)  ipratropium (ATROVENT) 0.06 % nasal spray 2 spray (has no administration in time range)  memantine (NAMENDA) tablet 10 mg (has no administration in time range)  mirtazapine (REMERON) tablet 15 mg (has no administration in time range)  linagliptin (TRADJENTA) tablet 5 mg (has no administration in time range)   tamsulosin (FLOMAX) capsule 0.4 mg (has no administration in time range)  vitamin B-12 (CYANOCOBALAMIN) tablet 1,000 mcg (has no administration in time range)  furosemide (LASIX) tablet 20 mg (has no administration in time range)  furosemide (LASIX) tablet 20 mg (has no administration in time range)  diphenhydrAMINE (BENADRYL) 12.5 MG/5ML elixir 6.25-12.5 mg (has no administration in time range)    ED Course/ Medical Decision Making/ A&P                           Medical Decision Making Amount and/or Complexity of Data Reviewed Labs: ordered. Radiology: ordered.  Risk Decision regarding hospitalization.   This patient presents to the ED for concern of syncope versus seizure, this involves an extensive number of treatment options, and is a complaint that carries with it a high risk of complications and morbidity. The emergent differential diagnosis includes, but is not limited to,  The differential diagnosis of weakness includes but is not limited to neurologic causes (GBS, myasthenia gravis, CVA, MS, ALS, transverse myelitis, spinal cord injury, CVA, botulism, ) and other causes: ACS, Arrhythmia, syncope, orthostatic hypotension, sepsis, hypoglycemia, electrolyte disturbance, hypothyroidism, respiratory failure, symptomatic anemia, dehydration, heat injury, polypharmacy, malignancy.   Co morbidities that complicate the patient evaluation: dementia with behavioral disturbance, CKG stage III, diabetes, HTN Social Determinants of Health: significant smoking hx  Additional history obtained from son at bedside. External records from outside source obtained and reviewed including last neurology visit, that states the patient is on Depakote for behavioral disturbance and has no history of seizures.  Physical Exam: Physical exam performed. The pertinent findings include: Patient has bruising over the left orbit, PERRLA, abdomen is soft, chest and hips are stable, good pulses in all  extremities  Lab Tests: I Ordered, and personally interpreted labs.  The pertinent results include: Mild leukocytosis of 11, hemoglobin 10.1 stable compared to prior, creatinine 3.19 stable compared to prior, electrolytes grossly within normal limits   Imaging Studies: I ordered imaging studies including CT head, CT maxillofacial, CT cervical spine, left shoulder and chest x-rays. I independently visualized and interpreted imaging which showed mildly angulated proximal left humeral surgical neck and diaphyseal fracture, CT head showed multifocal multicompartment hemorrhages including small subarachnoid, subdural, and intraventricular hemorrhages.. I agree with the radiologist interpretation.   Medications: I have reviewed the patients home medicines.  It appears he has previously been on Depakote, but according to his son's history as well as the neurologist note he was not on this for seizures.  He was on this for behavioral disturbances related to dementia.  Consultations Obtained: I requested consultation with the neurosurgical team Dr. Shelba Flake, we discussed imaging findings  as well as pertinent plan - they recommend: Admit to medicine service for seizure work-up and they will consult on the patient.  I requested consultation with the orthopedic team Hilbert Odor, PA-C, he reviewed the patient's imaging, and recommended nonsurgical treatment, medical admission and Ortho follow-up in clinic.  I requested consultation with the hospitalist team Dr. Sherial Ebrahim [facility], and discussed lab and imaging findings as well as pertinent plan - they recommend: Admit to trauma service  Dispostion: After consideration of the diagnostic results and the patients response to treatment, I feel that patient is requiring admission and inpatient treatment for his symptoms.  Consulted trauma physician Dr. Bobbye Morton who will admit the patient. The patient appears reasonably stabilized for admission considering the current resources,  flow, and capabilities available in the ED at this time, and I doubt any other [facility] requiring further screening and/or treatment in the ED prior to admission.   Final Clinical Impression(s) / ED Diagnoses Final diagnoses:  New onset seizure ([employer])  Brain bleed ([place])  Closed 2-part displaced fracture of surgical neck of left humerus, initial encounter  Trauma    Rx / DC Orders ED Discharge Orders     None      I discussed this case with my attending physician Dr. Regenia Skeeter who cosigned this note including patient's presenting symptoms, physical exam, and planned diagnostics and interventions. Attending physician stated agreement with plan or made changes to plan which were implemented.    Portions of this report may have been transcribed using voice recognition software. Every effort was made to ensure accuracy; however, inadvertent computerized transcription errors may be present.    Kateri Plummer, PA-C 10/19/2021 1510    Rubye Strohmeyer T, PA-C 10/27/2021 1520    Sherwood Gambler, MD 10/26/2021 [id number]
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
## 2021-10-12 NOTE — Assessment & Plan Note (Addendum)
-  Patient with moderate to advanced dementia, often does not recognize his son. MMSE 15/30 during 04/2021 neurology appointment. Continue home meds for behavorial issues - remeron, Namenda, Aricept, Depakote -ativan may be worsening-- could do trial of haldol (Qtc ok)

## 2021-10-12 NOTE — Assessment & Plan Note (Addendum)
-  d/c norvasc

## 2021-10-13 ENCOUNTER — Inpatient Hospital Stay (HOSPITAL_COMMUNITY): Payer: Medicare Other

## 2021-10-13 ENCOUNTER — Inpatient Hospital Stay (HOSPITAL_COMMUNITY): Payer: Medicare Other | Admitting: Anesthesiology

## 2021-10-13 ENCOUNTER — Encounter (HOSPITAL_COMMUNITY): Admission: EM | Disposition: E | Payer: Self-pay | Source: Home / Self Care

## 2021-10-13 ENCOUNTER — Encounter (HOSPITAL_COMMUNITY): Payer: Self-pay

## 2021-10-13 ENCOUNTER — Other Ambulatory Visit: Payer: Self-pay

## 2021-10-13 DIAGNOSIS — I4719 Other supraventricular tachycardia: Secondary | ICD-10-CM

## 2021-10-13 DIAGNOSIS — N184 Chronic kidney disease, stage 4 (severe): Secondary | ICD-10-CM | POA: Diagnosis not present

## 2021-10-13 DIAGNOSIS — I619 Nontraumatic intracerebral hemorrhage, unspecified: Secondary | ICD-10-CM | POA: Diagnosis not present

## 2021-10-13 DIAGNOSIS — I471 Supraventricular tachycardia: Secondary | ICD-10-CM

## 2021-10-13 DIAGNOSIS — T1490XA Injury, unspecified, initial encounter: Secondary | ICD-10-CM | POA: Diagnosis not present

## 2021-10-13 DIAGNOSIS — N400 Enlarged prostate without lower urinary tract symptoms: Secondary | ICD-10-CM

## 2021-10-13 DIAGNOSIS — S42222A 2-part displaced fracture of surgical neck of left humerus, initial encounter for closed fracture: Secondary | ICD-10-CM | POA: Diagnosis not present

## 2021-10-13 DIAGNOSIS — S72002A Fracture of unspecified part of neck of left femur, initial encounter for closed fracture: Secondary | ICD-10-CM

## 2021-10-13 DIAGNOSIS — W19XXXA Unspecified fall, initial encounter: Secondary | ICD-10-CM | POA: Diagnosis not present

## 2021-10-13 DIAGNOSIS — Z66 Do not resuscitate: Secondary | ICD-10-CM | POA: Diagnosis not present

## 2021-10-13 HISTORY — PX: HIP ARTHROPLASTY: SHX981

## 2021-10-13 LAB — CBC
HCT: 26.6 % — ABNORMAL LOW (ref 39.0–52.0)
Hemoglobin: 8.8 g/dL — ABNORMAL LOW (ref 13.0–17.0)
MCH: 32.1 pg (ref 26.0–34.0)
MCHC: 33.1 g/dL (ref 30.0–36.0)
MCV: 97.1 fL (ref 80.0–100.0)
Platelets: 131 10*3/uL — ABNORMAL LOW (ref 150–400)
RBC: 2.74 MIL/uL — ABNORMAL LOW (ref 4.22–5.81)
RDW: 14.9 % (ref 11.5–15.5)
WBC: 15.8 10*3/uL — ABNORMAL HIGH (ref 4.0–10.5)
nRBC: 0 % (ref 0.0–0.2)

## 2021-10-13 LAB — MRSA NEXT GEN BY PCR, NASAL: MRSA by PCR Next Gen: NOT DETECTED

## 2021-10-13 LAB — RESP PANEL BY RT-PCR (FLU A&B, COVID) ARPGX2
Influenza A by PCR: NEGATIVE
Influenza B by PCR: NEGATIVE
SARS Coronavirus 2 by RT PCR: NEGATIVE

## 2021-10-13 LAB — BASIC METABOLIC PANEL
Anion gap: 16 — ABNORMAL HIGH (ref 5–15)
BUN: 41 mg/dL — ABNORMAL HIGH (ref 8–23)
CO2: 19 mmol/L — ABNORMAL LOW (ref 22–32)
Calcium: 8.5 mg/dL — ABNORMAL LOW (ref 8.9–10.3)
Chloride: 109 mmol/L (ref 98–111)
Creatinine, Ser: 3.18 mg/dL — ABNORMAL HIGH (ref 0.61–1.24)
GFR, Estimated: 18 mL/min — ABNORMAL LOW (ref >60)
Glucose, Bld: 184 mg/dL — ABNORMAL HIGH (ref 70–99)
Potassium: 4.2 mmol/L (ref 3.5–5.1)
Sodium: 144 mmol/L (ref 135–145)

## 2021-10-13 LAB — GLUCOSE, CAPILLARY
Glucose-Capillary: 180 mg/dL — ABNORMAL HIGH (ref 70–99)
Glucose-Capillary: 142 mg/dL — ABNORMAL HIGH (ref 70–99)
Glucose-Capillary: 191 mg/dL — ABNORMAL HIGH (ref 70–99)
Glucose-Capillary: 206 mg/dL — ABNORMAL HIGH (ref 70–99)

## 2021-10-13 LAB — PREPARE RBC (CROSSMATCH)

## 2021-10-13 LAB — ABO/RH: ABO/RH(D): A POS

## 2021-10-13 SURGERY — HEMIARTHROPLASTY, HIP, DIRECT ANTERIOR APPROACH, FOR FRACTURE
Anesthesia: General | Site: Hip | Laterality: Left

## 2021-10-13 MED ORDER — ROCURONIUM BROMIDE 10 MG/ML (PF) SYRINGE
PREFILLED_SYRINGE | INTRAVENOUS | Status: DC | PRN
  Administered 2021-10-13: 20 mg via INTRAVENOUS
  Administered 2021-10-13: 70 mg via INTRAVENOUS

## 2021-10-13 MED ORDER — SODIUM CHLORIDE 0.9 % IR SOLN
Status: DC | PRN
  Administered 2021-10-13: 3000 mL

## 2021-10-13 MED ORDER — FENTANYL CITRATE (PF) 100 MCG/2ML IJ SOLN
INTRAMUSCULAR | Status: AC
  Filled 2021-10-13: qty 2

## 2021-10-13 MED ORDER — ROCURONIUM BROMIDE 10 MG/ML (PF) SYRINGE
PREFILLED_SYRINGE | INTRAVENOUS | Status: AC
  Filled 2021-10-13: qty 30

## 2021-10-13 MED ORDER — SODIUM CHLORIDE 0.9% IV SOLUTION: Freq: Once | INTRAVENOUS | Status: AC

## 2021-10-13 MED ORDER — FENTANYL CITRATE (PF) 100 MCG/2ML IJ SOLN
25.0000 ug | INTRAMUSCULAR | Status: DC | PRN
  Administered 2021-10-13: 50 ug via INTRAVENOUS

## 2021-10-13 MED ORDER — CHLORHEXIDINE GLUCONATE 0.12 % MT SOLN
15.0000 mL | Freq: Once | OROMUCOSAL | Status: AC
  Administered 2021-10-13: 15 mL via OROMUCOSAL
  Filled 2021-10-13: qty 15

## 2021-10-13 MED ORDER — ACETAMINOPHEN 10 MG/ML IV SOLN: 1000.0000 mg | Freq: Once | INTRAVENOUS | Status: DC | PRN

## 2021-10-13 MED ORDER — PHENYLEPHRINE HCL-NACL 20-0.9 MG/250ML-% IV SOLN
INTRAVENOUS | Status: DC | PRN
  Administered 2021-10-13: 50 ug/min via INTRAVENOUS

## 2021-10-13 MED ORDER — LACTATED RINGERS IV SOLN: INTRAVENOUS | Status: DC

## 2021-10-13 MED ORDER — ACETAMINOPHEN 160 MG/5ML PO SOLN: 1000.0000 mg | Freq: Once | ORAL | Status: DC | PRN

## 2021-10-13 MED ORDER — BUPIVACAINE HCL 0.25 % IJ SOLN
INTRAMUSCULAR | Status: DC | PRN
  Administered 2021-10-13: 30 mL

## 2021-10-13 MED ORDER — DEXAMETHASONE SODIUM PHOSPHATE 10 MG/ML IJ SOLN
INTRAMUSCULAR | Status: AC
  Filled 2021-10-13: qty 4

## 2021-10-13 MED ORDER — ALBUMIN HUMAN 5 % IV SOLN
INTRAVENOUS | Status: DC | PRN
Start: 2021-10-13 — End: 2021-10-13

## 2021-10-13 MED ORDER — BUPIVACAINE HCL (PF) 0.25 % IJ SOLN
INTRAMUSCULAR | Status: AC
  Filled 2021-10-13: qty 30

## 2021-10-13 MED ORDER — ACETAMINOPHEN 500 MG PO TABS: 1000.0000 mg | ORAL_TABLET | Freq: Once | ORAL | Status: DC | PRN

## 2021-10-13 MED ORDER — FENTANYL CITRATE (PF) 250 MCG/5ML IJ SOLN
INTRAMUSCULAR | Status: DC | PRN
  Administered 2021-10-13: 100 ug via INTRAVENOUS

## 2021-10-13 MED ORDER — PROPOFOL 10 MG/ML IV BOLUS
INTRAVENOUS | Status: DC | PRN
  Administered 2021-10-13: 40 mg via INTRAVENOUS

## 2021-10-13 MED ORDER — SODIUM CHLORIDE 0.9 % IV SOLN: INTRAVENOUS | Status: DC | PRN

## 2021-10-13 MED ORDER — PROPOFOL 10 MG/ML IV BOLUS
INTRAVENOUS | Status: AC
  Filled 2021-10-13: qty 20

## 2021-10-13 MED ORDER — SUCCINYLCHOLINE CHLORIDE 200 MG/10ML IV SOSY
PREFILLED_SYRINGE | INTRAVENOUS | Status: AC
  Filled 2021-10-13: qty 10

## 2021-10-13 MED ORDER — METOPROLOL TARTRATE 25 MG PO TABS
12.5000 mg | ORAL_TABLET | Freq: Two times a day (BID) | ORAL | Status: DC
  Administered 2021-10-13 – 2021-10-15 (×5): 12.5 mg via ORAL
  Filled 2021-10-13 (×5): qty 1

## 2021-10-13 MED ORDER — CHLORHEXIDINE GLUCONATE 4 % EX LIQD: 60.0000 mL | Freq: Once | CUTANEOUS | Status: DC

## 2021-10-13 MED ORDER — PHENYLEPHRINE HCL (PRESSORS) 10 MG/ML IV SOLN
INTRAVENOUS | Status: DC | PRN
  Administered 2021-10-13 (×5): 80 ug via INTRAVENOUS

## 2021-10-13 MED ORDER — EPHEDRINE 5 MG/ML INJ
INTRAVENOUS | Status: AC
  Filled 2021-10-13: qty 5

## 2021-10-13 MED ORDER — CEFAZOLIN SODIUM-DEXTROSE 2-4 GM/100ML-% IV SOLN
INTRAVENOUS | Status: AC
  Filled 2021-10-13: qty 100

## 2021-10-13 MED ORDER — FENTANYL CITRATE (PF) 250 MCG/5ML IJ SOLN
INTRAMUSCULAR | Status: AC
  Filled 2021-10-13: qty 5

## 2021-10-13 MED ORDER — SUGAMMADEX SODIUM 200 MG/2ML IV SOLN
INTRAVENOUS | Status: DC | PRN
Start: 2021-10-13 — End: 2021-10-13
  Administered 2021-10-13: 200 mg via INTRAVENOUS

## 2021-10-13 MED ORDER — ORAL CARE MOUTH RINSE: 15.0000 mL | Freq: Once | OROMUCOSAL | Status: AC

## 2021-10-13 MED ORDER — ONDANSETRON HCL 4 MG/2ML IJ SOLN
INTRAMUSCULAR | Status: DC | PRN
  Administered 2021-10-13: 4 mg via INTRAVENOUS

## 2021-10-13 MED ORDER — CHLORHEXIDINE GLUCONATE CLOTH 2 % EX PADS
6.0000 | MEDICATED_PAD | Freq: Every day | CUTANEOUS | Status: DC
  Administered 2021-10-14: 6 via TOPICAL

## 2021-10-13 MED ORDER — 0.9 % SODIUM CHLORIDE (POUR BTL) OPTIME
TOPICAL | Status: DC | PRN
  Administered 2021-10-13: 1000 mL

## 2021-10-13 MED ORDER — POVIDONE-IODINE 10 % EX SWAB
2.0000 | Freq: Once | CUTANEOUS | Status: AC
  Administered 2021-10-13: 2 via TOPICAL

## 2021-10-13 MED ORDER — CEFAZOLIN SODIUM-DEXTROSE 2-4 GM/100ML-% IV SOLN: 2.0000 g | INTRAVENOUS | Status: DC

## 2021-10-13 MED ORDER — ONDANSETRON HCL 4 MG/2ML IJ SOLN
INTRAMUSCULAR | Status: AC
  Filled 2021-10-13: qty 12

## 2021-10-13 SURGICAL SUPPLY — 54 items
BLADE SAGITTAL 25.0X1.27X90 (BLADE) ×2 IMPLANT
BRUSH FEMORAL CANAL (MISCELLANEOUS) ×1 IMPLANT
CEMENT BONE SIMPLEX SPEEDSET (Cement) ×2 IMPLANT
CHLORAPREP W/TINT 26 (MISCELLANEOUS) ×4 IMPLANT
COVER SURGICAL LIGHT HANDLE (MISCELLANEOUS) ×2 IMPLANT
DERMABOND ADVANCED (GAUZE/BANDAGES/DRESSINGS) ×1
DERMABOND ADVANCED .7 DNX12 (GAUZE/BANDAGES/DRESSINGS) ×1 IMPLANT
DRAPE HALF SHEET 40X57 (DRAPES) ×2 IMPLANT
DRAPE HIP W/POCKET STRL (MISCELLANEOUS) ×2 IMPLANT
DRAPE IMP U-DRAPE 54X76 (DRAPES) ×2 IMPLANT
DRAPE INCISE IOBAN 66X45 STRL (DRAPES) ×2 IMPLANT
DRAPE INCISE IOBAN 85X60 (DRAPES) ×2 IMPLANT
DRAPE POUCH INSTRU U-SHP 10X18 (DRAPES) ×1 IMPLANT
DRAPE U-SHAPE 47X51 STRL (DRAPES) ×4 IMPLANT
DRSG AQUACEL AG ADV 3.5X10 (GAUZE/BANDAGES/DRESSINGS) ×1 IMPLANT
ELECT BLADE 4.0 EZ CLEAN MEGAD (MISCELLANEOUS) ×2
ELECT REM PT RETURN 15FT ADLT (MISCELLANEOUS) ×2 IMPLANT
ELECTRODE BLDE 4.0 EZ CLN MEGD (MISCELLANEOUS) ×1 IMPLANT
FEMORAL HEAD LFIT V40 28MM P4 (Head) ×1 IMPLANT
GLOVE SRG 8 PF TXTR STRL LF DI (GLOVE) ×1 IMPLANT
GLOVE SURG ORTHO LTX SZ8 (GLOVE) ×4 IMPLANT
GLOVE SURG UNDER POLY LF SZ8 (GLOVE) ×2
GOWN STRL REUS W/ TWL XL LVL3 (GOWN DISPOSABLE) ×1 IMPLANT
GOWN STRL REUS W/TWL XL LVL3 (GOWN DISPOSABLE) ×2
HANDPIECE INTERPULSE COAX TIP (DISPOSABLE) ×2
HEAD BP UNV 49X28XHIP FEM (Head) IMPLANT
HEAD OSTEO BIPOLAR (Head) ×2 IMPLANT
HOOD PEEL AWAY FLYTE STAYCOOL (MISCELLANEOUS) ×6 IMPLANT
KIT BASIN OR (CUSTOM PROCEDURE TRAY) ×2 IMPLANT
KIT TURNOVER KIT A (KITS) ×2 IMPLANT
MANIFOLD NEPTUNE II (INSTRUMENTS) ×2 IMPLANT
MARKER SKIN DUAL TIP RULER LAB (MISCELLANEOUS) ×2 IMPLANT
NDL 18GX1X1/2 (RX/OR ONLY) (NEEDLE) IMPLANT
NEEDLE 18GX1X1/2 (RX/OR ONLY) (NEEDLE) ×2 IMPLANT
NS IRRIG 1000ML POUR BTL (IV SOLUTION) ×2 IMPLANT
PACK TOTAL JOINT (CUSTOM PROCEDURE TRAY) ×2 IMPLANT
PRESSURIZER FEMORAL UNIV (MISCELLANEOUS) ×1 IMPLANT
RESTRICTOR CEMENT PE SZ 2 (Cement) ×1 IMPLANT
RETRIEVER SUT HEWSON (MISCELLANEOUS) ×2 IMPLANT
SEALER BIPOLAR AQUA 6.0 (INSTRUMENTS) ×3 IMPLANT
SET HNDPC FAN SPRY TIP SCT (DISPOSABLE) IMPLANT
SET INTERPULSE LAVAGE W/TIP (ORTHOPEDIC DISPOSABLE SUPPLIES) ×2 IMPLANT
SPONGE T-LAP 18X18 ~~LOC~~+RFID (SPONGE) ×4 IMPLANT
STEM HIP ACCOLADE SZ4 35X137 (Stem) ×1 IMPLANT
STOCKINETTE IMPERVIOUS LG (DRAPES) ×2 IMPLANT
SUT ETHIBOND 2 V 37 (SUTURE) ×2 IMPLANT
SUT MNCRL AB 3-0 PS2 18 (SUTURE) ×2 IMPLANT
SUT STRATAFIX 1PDS 45CM VIOLET (SUTURE) ×4 IMPLANT
SUT VIC AB 0 CT1 27 (SUTURE) ×2
SUT VIC AB 0 CT1 27XBRD ANBCTR (SUTURE) ×1 IMPLANT
SUT VIC AB 2-0 CT2 27 (SUTURE) ×4 IMPLANT
TOWEL GREEN STERILE (TOWEL DISPOSABLE) ×2 IMPLANT
TOWER CARTRIDGE SMART MIX (DISPOSABLE) ×1 IMPLANT
TUBE SUCT ARGYLE STRL (TUBING) ×2 IMPLANT

## 2021-10-13 NOTE — Assessment & Plan Note (Signed)
-  management per trauma team

## 2021-10-13 NOTE — Progress Notes (Signed)
Daily Progress Note   Patient Name: Samuel [name]       Date: 10/21/2021 DOB: 18-Apr-1930  Age: 88 y.o. MRN#: [id number] Attending Physician: Particia Jasper, MD Primary Care Physician: Haywood Pao, MD Admit Date: 10/11/2021  Reason for Consultation/Follow-up: Establishing goals of care  Subjective: Chart review performed. Received report from primary RN - no acute concerns. RN states patient had a "rough night" with confusion and multiple attempts to get up. Patient had issues taking pills this morning - when he finally took them RN reports significant coughing. RN also reports patient was pulling at tubes, which required the placement of mitts.  In light of orthopedics recommendation for ORIF surgical intervention for hip fracture, discussed case with Dr. Grandville Silos.   Went to visit patient at bedside - no family/visitors present. Patient was lying in bed asleep - I did not attempt to wake him. He has mitts in place. No signs or non-verbal gestures of pain or discomfort noted. No respiratory distress, increased work of breathing, or secretions noted.   1:30 PM Called patient's wife/Lela for family meeting. Family members present with wife on speakerphone for continued GOC today were: wife/Lela, son/[name], son/Del, granddaughter/Nikki, daughter/Cathy, granddaughter/[name], DIL/[name], son/[name], grandson/[name].  Reviewed patient's interval history since his admission. Provided updates on RN and my assessment from today.   Lenghty discussion was had around whether family wished for patient to pursue surgical intervention. Reviewed how surgical intervention was not needed to pursue full comfort measures, but per ortho, the surgery could improve patient's pain. Discussed what ORIF was and education  provided per their request. After discussion, family have opted to move forward with surgery. Notified them surgery was scheduled to start at 1455.   Family members present for meeting yesterday request information again be reviewed.  We discussed patient's current illness and what it means in the larger context of patient's on-going co-morbidities.  Son/Del has a clear understanding that dementia and CKD are progressive, non-curable diseases underlying the patient's current acute medical conditions. Acute medical situation was reviewed in detail. Patient's history is significant for gradually worsening progressive symptoms of dementia. Natural disease trajectory and expectations at EOL were discussed. I attempted to elicit values and goals of care important to the patient. The difference between aggressive medical intervention and comfort care was considered in light of  the patient's goals of care.    Hospice and Palliative Care services outpatient were explained and offered. Family are considering patient discharge with home hospice services and private duty caregivers vs rehab. Discussed that the goal of rehab is improvement/stabalization of functional status,  which can be a difficult goal to meet for patients with advanced illness and multiple medical conditions. Reviewed what is needed for someone to have a positive rehabilitation experience to include adequate nutritional intake as well as willingness/ability to participate. Family request further information on rehab facilities in case this is the option they decide to pursue.   Encouraged family to consider DNR/DNI status understanding evidenced based poor outcomes in similar hospitalized patient, as the cause of arrest is likely associated with advanced chronic/terminal illness rather than an easily reversible acute cardio-pulmonary event. I explained that DNR/DNI does not change the medical plan and it only comes into effect after a person has  arrested (died).  It is a protective measure to keep [place] from harming the patient in their last moments of life. Patient's wife was agreeable to DNR/DNI with understanding that he would not receive CPR, defibrillation, ACLS medications, or intubation.   Family inquire about medication use: Reviewed the delicate balance of symptom management with sedating medications for anxiety and pain while pursuing full aggressive interventions in context of brain bleed. Reviewed follow up head CT was done today in light of increasing lethargy and decreased interaction.  Recommended to allow patient at least one day post-op to see how he's doing prior to another family meeting to discuss next steps - family meeting scheduled for Thursday 2/9 at 3pm.  Will call granddaughter/Nikki tomorrow to touch base per their request.   All questions and concerns addressed. Encouraged to call with questions and/or concerns. PMT card previously provided.   Length of Stay: 1  Current Medications: Scheduled Meds:   [MAR Hold] acetaminophen  1,000 mg Oral Q6H   [MAR Hold] allopurinol  100 mg Oral QHS   [MAR Hold] amLODipine  10 mg Oral q morning   [MAR Hold] atorvastatin  20 mg Oral q morning   chlorhexidine  60 mL Topical Once   [MAR Hold] Chlorhexidine Gluconate Cloth  6 each Topical Daily   [MAR Hold] divalproex  500 mg Oral QPM   [MAR Hold] docusate sodium  100 mg Oral BID   [MAR Hold] donepezil  10 mg Oral QHS   [MAR Hold] furosemide  20 mg Oral Daily   [MAR Hold] gabapentin  100 mg Oral QHS   [MAR Hold] insulin aspart  0-5 Units Subcutaneous QHS   [MAR Hold] insulin aspart  0-9 Units Subcutaneous TID WC   [MAR Hold] memantine  10 mg Oral BID   [MAR Hold] metoprolol tartrate  12.5 mg Oral BID   [MAR Hold] mirtazapine  15 mg Oral QHS   [MAR Hold] tamsulosin  0.4 mg Oral QPM   [MAR Hold] vitamin B-12  1,000 mcg Oral q morning    Continuous Infusions:  sodium chloride 75 mL/hr at 11/01/2021 0700   ceFAZolin      [START ON 10/14/2021]  ceFAZolin (ANCEF) IV     lactated ringers 10 mL/hr at 10/23/2021 1512   [MAR Hold] levETIRAcetam 500 mg (10/08/2021 1403)    PRN Meds: [MAR Hold] diphenhydrAMINE, [MAR Hold] ipratropium, [MAR Hold]  morphine injection, [MAR Hold] ondansetron **OR** [MAR Hold] ondansetron (ZOFRAN) IV, [MAR Hold] oxyCODONE  Physical Exam Vitals and nursing note reviewed.  Constitutional:  General: He is not in acute distress.    Appearance: He is ill-appearing.  Pulmonary:     Effort: No respiratory distress.  Skin:    General: Skin is warm and dry.  Neurological:     Mental Status: He is lethargic and confused.     Motor: Weakness present.            Vital Signs: BP (!) 142/63    Pulse 88    Temp 98.6 F (37 C) (Axillary)    Resp 16    Ht 5\' 4"  (1.626 m)    Wt 66.7 kg    SpO2 99%    BMI 25.24 kg/m  SpO2: SpO2: 99 % O2 Device: O2 Device: Nasal Cannula O2 Flow Rate: O2 Flow Rate (L/min): 2 L/min  Intake/output summary:  Intake/Output Summary (Last 24 hours) at 10/28/2021 1537 Last data filed at 10/28/2021 0700 Gross per 24 hour  Intake 630.54 ml  Output 325 ml  Net 305.54 ml   LBM: Last BM Date:  (PTA) Baseline Weight: Weight: 66.7 kg Most recent weight: Weight: 66.7 kg       Palliative Assessment/Data: PPS 10% due to NPO for surgery      Patient Active Problem List   Diagnosis Date Noted   Left displaced femoral neck fracture (HCC) 10/19/2021   BPH (benign prostatic hyperplasia) 10/07/2021   Multifocal atrial tachycardia ([place]) 10/26/2021   ICH (intracerebral hemorrhage) ([place]) 10/22/2021   Fall at home, initial encounter 10/15/2021   Humerus fracture 10/25/2021   CKD (chronic kidney disease) stage 4, GFR 15-29 ml/min (HCC) 10/09/2021   DNR (do not resuscitate) 10/30/2021   Moderate dementia with behavioral disturbance 04/14/2021   Mild cognitive impairment 08/05/2016   Essential hypertension 08/05/2016   Other hyperlipidemia 08/05/2016   Type 2 diabetes  mellitus without complication, with long-term current use of insulin ([place]) 08/05/2016    Palliative Care Assessment & Plan   Patient Profile: 88 y.o. male  with past medical history of BPH, stage 3b CKD, DM, Alzheimer's dementia, HTN, and HLD presented to ED on 10/30/2021 from home after wife witnessed syncopal event vs seizure with fall. He lost consciousness for several minutes. Neurosurgery was consulted who recommended patient be admitted for seizure work up. Orthopedics were consulted and recommended non-surgical treatment with sling, NWB, and outpatient follow up. Patient was admitted on 10/14/2021 with fall at home, CKD stage 4, humerus fracture, intracranial hemorrhage.   Assessment: Left femoral neck fracture Left proximal humerus fracture Fall at home Multifactorial tachycardia CKD stage 4 ICH Moderate dementia with behavioral disturbance  Recommendations/Plan: Family agreeable for patient to proceed with surgical intervention for hip fracture today Confirmed DNR/DNI with patient's wife PMT will follow up with granddaughter who is a nurse tomorrow at 3p per her request. Another family meeting scheduled for Thursday 2/9 at 3p to continue [place] pending patient's clinical course post-op Family are considering patient's discharge home with hospice and private duty caregivers vs rehab [facility] notified and consulted for: family's request for rehab facility options PMT will continue to follow and support holistically  Goals of Care and Additional Recommendations: Limitations on Scope of Treatment: Full Scope Treatment and No Tracheostomy  Code Status:    Code Status Orders  (From admission, onward)           Start     Ordered   10/20/2021 1549  Do not attempt resuscitation (DNR)  Continuous       Question Answer Comment  In the event of  cardiac or respiratory ARREST Do not call a code blue   In the event of cardiac or respiratory ARREST Do not perform Intubation, CPR, defibrillation  or ACLS   In the event of cardiac or respiratory ARREST Use medication by any route, position, wound care, and other measures to relive pain and suffering. May use oxygen, suction and manual treatment of airway obstruction as needed for comfort.      10/08/2021 1549           Code Status History     Date Active Date Inactive Code Status Order ID Comments User Context   10/11/2021 1515 10/25/2021 1549 Full Code [id number]  Jesusita Oka, MD ED       Prognosis:  Unable to determine  Discharge Planning: To Be Determined  Care plan was discussed with primary RN, Dr. Grandville Silos, [facility], patient's family  Thank you for allowing the Palliative Medicine Team to assist in the care of this patient.   Total Time 90 minutes Prolonged Time Billed  yes       Greater than 50%  of this time was spent counseling and coordinating care related to the above assessment and plan.  Lin Landsman, NP  Please contact Palliative Medicine Team phone at [phone] for questions and concerns.

## 2021-10-13 NOTE — Evaluation (Signed)
Physical Therapy Evaluation Patient Details Name: Samuel [name] MRN: [id number] DOB: Feb 25, 1930 Today's Date: 10/17/2021  History of Present Illness  Pt is 88 yo male who presents after syncopal vs seizure episode at home with a fall.Pt with L humeral neck fx (non-op mgmt with sling) and L displaced femoral neck fx.  CT showed multiple small ICH. PMH: dementia, DM, HTN, IDA, CKD, BPH  Clinical Impression  Pt admitted with above diagnosis. Pt presents from home where per his report he was independent with ambulation and bathing and dressing. Pt lethargic and significantly limited by pain on eval. Needed max A +2 to come to EOB, max A to maintain sitting due to posterior lean, and max A +2 to return to bed. At this point, recommend SNF level care, however given pt's dementia would likely do better at home if he has 24 hr max A care.  Pt currently with functional limitations due to the deficits listed below (see PT Problem List). Pt will benefit from skilled PT to increase their independence and safety with mobility to allow discharge to the venue listed below.          Recommendations for follow up therapy are one component of a multi-disciplinary discharge planning process, led by the attending physician.  Recommendations may be updated based on patient status, additional functional criteria and insurance authorization.  Follow Up Recommendations Skilled nursing-short term rehab (<3 hours/day) unless he has 24 hr caregiver than can provide max A in which case would recommend home.     Assistance Recommended at Discharge Frequent or constant Supervision/Assistance  Patient can return home with the following  Two people to help with bathing/dressing/bathroom;Assistance with feeding;Assistance with cooking/housework;Direct supervision/assist for medications management;Direct supervision/assist for financial management;Assist for transportation;Help with stairs or ramp for entrance;Two people to help  with walking and/or transfers    Equipment Recommendations Wheelchair (measurements PT)  Recommendations for Other Services       Functional Status Assessment Patient has had a recent decline in their functional status and demonstrates the ability to make significant improvements in function in a reasonable and predictable amount of time.     Precautions / Restrictions Precautions Precautions: Fall Required Braces or Orthoses: Sling Restrictions Weight Bearing Restrictions: Yes LUE Weight Bearing: Non weight bearing LLE Weight Bearing: Non weight bearing      Mobility  Bed Mobility Overal bed mobility: Needs Assistance Bed Mobility: Supine to Sit, Sit to Supine     Supine to sit: Max assist, +2 for physical assistance Sit to supine: Max assist, +2 for physical assistance   General bed mobility comments: pt painful with all mvmt, needed full support pivoting to EOB and returning to supine    Transfers                   General transfer comment: unable to tolerate today    Ambulation/Gait               General Gait Details: unable  Stairs            Wheelchair Mobility    Modified Rankin (Stroke Patients Only)       Balance Overall balance assessment: Needs assistance, History of Falls Sitting-balance support: Single extremity supported, Feet supported Sitting balance-Leahy Scale: Zero Sitting balance - Comments: max A due to posterior lean, pt had difficulty wt shifting anterior due to L hip pain Postural control: Posterior lean, Right lateral lean  Pertinent Vitals/Pain Pain Assessment Pain Assessment: Faces Faces Pain Scale: Hurts whole lot Pain Location: L shoulder and L hip Pain Descriptors / Indicators: Aching, Sore Pain Intervention(s): Limited activity within patient's tolerance, Monitored during session, Repositioned    Home Living Family/patient expects to be discharged to::  Private residence Living Arrangements: Spouse/significant other Available Help at Discharge: Family;Available 24 hours/day               Additional Comments: per chart pt lives with wife of 52 yrs and son. Pt not a reliable historian    Prior Function Prior Level of Function : Needs assist;Driving             Mobility Comments: pt reports he ambulated without AD. Chart states that he still drives but only with someone so he doesn't get lost ADLs Comments: pt reports that he bathes and dresses himself     Hand Dominance   Dominant Hand: Right    Extremity/Trunk Assessment   Upper Extremity Assessment Upper Extremity Assessment: Defer to OT evaluation    Lower Extremity Assessment Lower Extremity Assessment: RLE deficits/detail;Generalized weakness;LLE deficits/detail RLE Deficits / Details: hip flex >3/5, knee ext >3/5 RLE: Unable to fully assess due to pain RLE Sensation: WNL RLE Coordination: WNL LLE Deficits / Details: painful with all mvmt of LLE. Unable to lift LLE, able to extend knee in sitting 2/5 LLE: Unable to fully assess due to pain LLE Coordination: decreased gross motor    Cervical / Trunk Assessment Cervical / Trunk Assessment: Kyphotic  Communication   Communication: No difficulties  Cognition Arousal/Alertness: Lethargic, Suspect due to medications Behavior During Therapy: WFL for tasks assessed/performed Overall Cognitive Status: History of cognitive impairments - at baseline                                 General Comments: oriented to self and location, assessment limited by pt's lethargy and pain        General Comments General comments (skin integrity, edema, etc.): VSS, pt with L arm skin tear, dressed by RN while pt sitting    Exercises General Exercises - Lower Extremity Quad Sets: AROM, Left, 5 reps, Supine Long Arc Quad: AROM, Right, 5 reps, Seated, Left, PROM   Assessment/Plan    PT Assessment Patient needs  continued PT services  PT Problem List Decreased strength;Decreased range of motion;Decreased activity tolerance;Decreased balance;Decreased mobility;Decreased coordination;Decreased cognition;Decreased knowledge of use of DME;Decreased safety awareness;Decreased knowledge of precautions;Pain       PT Treatment Interventions DME instruction;Functional mobility training;Therapeutic activities;Therapeutic exercise;Balance training;Patient/family education;Neuromuscular re-education    PT Goals (Current goals can be found in the Care Plan section)  Acute Rehab PT Goals Patient Stated Goal: none stated PT Goal Formulation: With patient Time For Goal Achievement: 10/27/21 Potential to Achieve Goals: Fair    Frequency Min 3X/week     Co-evaluation PT/OT/SLP Co-Evaluation/Treatment: Yes Reason for Co-Treatment: Complexity of the patient's impairments (multi-system involvement);Necessary to address cognition/behavior during functional activity;For patient/therapist safety PT goals addressed during session: Mobility/safety with mobility;Balance         AM-PAC PT "6 Clicks" Mobility  Outcome Measure Help needed turning from your back to your side while in a flat bed without using bedrails?: Total Help needed moving from lying on your back to sitting on the side of a flat bed without using bedrails?: Total Help needed moving to and from a bed to a chair (including a wheelchair)?:  Total Help needed standing up from a chair using your arms (e.g., wheelchair or bedside chair)?: Total Help needed to walk in hospital room?: Total Help needed climbing 3-5 steps with a railing? : Total 6 Click Score: 6    End of Session   Activity Tolerance: Patient limited by pain Patient left: in bed;with call bell/phone within reach;with bed alarm set Nurse Communication: Mobility status PT Visit Diagnosis: Muscle weakness (generalized) (M62.81);Difficulty in walking, not elsewhere classified  (R26.2);Pain Pain - Right/Left: Left Pain - part of body: Shoulder;Hip    Time: 9847-3085 PT Time Calculation (min) (ACUTE ONLY): 27 min   Charges:   PT Evaluation $PT Eval Moderate Complexity: [place]  Pager [phone] Office [place] 10/15/2021, 10:45 AM

## 2021-10-13 NOTE — Anesthesia Procedure Notes (Signed)
Procedure Name: Intubation Date/Time: 10/10/2021 4:40 PM Performed by: Eligha Bridegroom, CRNA Pre-anesthesia Checklist: Patient identified, Emergency Drugs available, Suction available, Patient being monitored and Timeout performed Patient Re-evaluated:Patient Re-evaluated prior to induction Oxygen Delivery Method: Circle system utilized Preoxygenation: Pre-oxygenation with 100% oxygen Induction Type: IV induction Laryngoscope Size: Mac and 3 Grade View: Grade I Tube type: Oral Number of attempts: 1 Airway Equipment and Method: Stylet Placement Confirmation: ETT inserted through vocal cords under direct vision, positive ETCO2 and breath sounds checked- equal and bilateral Secured at: 21 cm Tube secured with: Tape Dental Injury: Teeth and Oropharynx as per pre-operative assessment

## 2021-10-13 NOTE — Interval H&P Note (Signed)
The patient has been re-examined, and the chart reviewed, and there have been no interval changes to the documented history and physical.   The operative side was examined and the patient was confirmed to have. Spontaneous ankle ROM, and DP 2+, No significant edema.   The risks, benefits, and alternatives have been discussed at length with patient's family, and the family is willing to proceed.  Left hip marked. Consent has been signed.

## 2021-10-13 NOTE — Progress Notes (Signed)
Patient ID: Samuel [name], male   DOB: 08/30/1930, 88 y.o.   MRN: [id number] I spoke with the Ortho team and they plan ORIF L hip FX by Dr. Caro Laroche and I agree with that. F/U CT head per NS noted. I called Samuel' son, [name], and updated him. He plans to meet with Palliative Care this afternoon as well. Georganna Skeans, MD, MPH, FACS Please use [email or web address] to contact on call provider

## 2021-10-13 NOTE — Progress Notes (Signed)
Seems more lethargic this morning and not really answering questions for [place].  We will order follow-up head CT.

## 2021-10-13 NOTE — Progress Notes (Signed)
Pt in pain this morning. I was going to give tylenol, but pt unable to drink from straw this morning. Morphine given.

## 2021-10-13 NOTE — TOC CAGE-AID Note (Signed)
Transition of Care [facility]) - CAGE-AID Screening   Patient Details  Name: Samuel [name] MRN: [id number] Date of Birth: 03-31-1930  Transition of Care [facility]) CM/SW Contact:    Dineen Conradt C Tarpley-Carter, [place] Phone Number: 10/31/2021, 9:34 AM   Clinical Narrative: Pt is unable to participate in Cage Aid. Pt is experiencing dementia.    Kimber Fritts Tarpley-Carter, MSW, LCSW-A Pronouns:  She/Her/[place] Transitions of Care Clinical Social Worker Direct Number:  [phone] [email or web address]  CAGE-AID Screening: Substance Abuse Screening unable to be completed due to: : Patient unable to participate             Substance Abuse Education Offered: No

## 2021-10-13 NOTE — Progress Notes (Signed)
Report called to [facility] in preop.

## 2021-10-13 NOTE — Anesthesia Postprocedure Evaluation (Signed)
Anesthesia Post Note  Patient: Samuel [name]  Procedure(s) Performed: ARTHROPLASTY BIPOLAR HIP (HEMIARTHROPLASTY) (Left: Hip)     Patient location during evaluation: PACU Anesthesia Type: General Level of consciousness: awake Pain management: pain level controlled Vital Signs Assessment: post-procedure vital signs reviewed and stable Respiratory status: spontaneous breathing Cardiovascular status: stable Postop Assessment: no apparent nausea or vomiting Anesthetic complications: no   No notable events documented.  Last Vitals:  Vitals:   10/31/2021 1915 10/25/2021 1930  BP: (!) 130/57 129/90  Pulse: 72 84  Resp: 20 16  Temp:    SpO2: 94% 91%    Last Pain:  Vitals:   10/17/2021 1200  TempSrc: Axillary  PainSc:                  [name]

## 2021-10-13 NOTE — Assessment & Plan Note (Signed)
-  telemetry irregular, A fib vs MAT? Obtain 12 lead, continue to monitor. Low dose Metoprolol. Unable to anticoagulate due to [place]

## 2021-10-13 NOTE — Anesthesia Preprocedure Evaluation (Addendum)
Anesthesia Evaluation  Patient identified by MRN, date of birth, ID band Patient awake    Reviewed: Allergy & Precautions, NPO status , Patient's Chart, lab work & pertinent test results  Airway Mallampati: II  TM Distance: >3 FB Neck ROM: Full    Dental  (+) Dental Advisory Given, Missing   Pulmonary former smoker,    Pulmonary exam normal breath sounds clear to auscultation       Cardiovascular hypertension, Pt. on medications Normal cardiovascular exam Rhythm:Regular Rate:Normal     Neuro/Psych PSYCHIATRIC DISORDERS Dementia Multifocal intracranial hemorrhage     GI/Hepatic negative GI ROS, Neg liver ROS,   Endo/Other  diabetes, Type 2, Oral Hypoglycemic Agents  Renal/GU Renal InsufficiencyRenal disease     Musculoskeletal negative musculoskeletal ROS (+) Left humeral fracture-Per Dr. Doreatha Martin, nonop in a sling. Left femoral neck fracture   Abdominal   Peds  Hematology  (+) Blood dyscrasia (Thrombocytopenia), anemia ,   Anesthesia Other Findings Day of surgery medications reviewed with the patient.  Reproductive/Obstetrics                            Anesthesia Physical Anesthesia Plan  ASA: 3  Anesthesia Plan: General   Post-op Pain Management: Tylenol PO (pre-op)   Induction: Intravenous  PONV Risk Score and Plan: 2 and Dexamethasone and Ondansetron  Airway Management Planned: Oral ETT  Additional Equipment:   Intra-op Plan:   Post-operative Plan: Extubation in OR  Informed Consent: I have reviewed the patients History and Physical, chart, labs and discussed the procedure including the risks, benefits and alternatives for the proposed anesthesia with the patient or authorized representative who has indicated his/her understanding and acceptance.   Patient has DNR.  Discussed DNR with power of attorney and Continue DNR.   Dental advisory given and Consent reviewed with  POA  Plan Discussed with: CRNA  Anesthesia Plan Comments: (PHONE CONSENT WITH PATIENT'S SON)       Anesthesia Quick Evaluation

## 2021-10-13 NOTE — Assessment & Plan Note (Signed)
--  continue flomax

## 2021-10-13 NOTE — Progress Notes (Signed)
Orthopedic Tech Progress Note Patient Details:  Samuel [name] June 19, 1930 [id number]  RN called requesting for a new SHOULDER SLING, patient arm wound had soiled the 1st shoulder sling.   Ortho Devices Type of Ortho Device: Shoulder immobilizer Ortho Device/Splint Location: LUE Ortho Device/Splint Interventions: Ordered, Application, Adjustment   Post Interventions Patient Tolerated: Well Instructions Provided: [facility] [place] 10/18/2021, 9:43 AM

## 2021-10-13 NOTE — Progress Notes (Signed)
PROGRESS NOTE  Samuel [name] UUV:[id number] DOB: [birth year] DOA: 10/24/2021 PCP: Haywood Pao, MD   LOS: 1 day   Brief Narrative / Interim history: 88 y.o. male with medical history significant of BPH, CKD, DM, dementia (still driving with supervising family), HTN, and HLD presenting with a fall. The morning of admission, the patient was thrashing about in bed and his wife was concerned that he was having a "diabetic seizure."  He has reportedly had one prior.  He is no longer on insulin and just "sometimes" takes a pill.  This morning, his wife went to get him a "sugar pill" and then when that didn't help she went to get him another.   While she was gone, he got out of the bed and fell. At baseline he ambulates without assistance, family has occasional caregiver.  Subjective / 24h Interval events: -confused, lethargic   Assessment and Plan: Fall at home, initial encounter -on trauma service due to multiple fractures, ICH  Type 2 diabetes mellitus without complication, with long-term current use of insulin ([place]) -Patient was taken off insulin, currently on Amaryl and Januvia at home. Hold oral meds, cover with sensitive-scale SSI  CBG (last 3)  Recent Labs    10/15/2021 1215 11/01/2021 2226  GLUCAP 143* 309*    Multifocal atrial tachycardia (HCC) -telemetry irregular, A fib vs MAT? Obtain 12 lead, continue to monitor. Low dose Metoprolol. Unable to anticoagulate due to [place]  CKD (chronic kidney disease) stage 4, GFR 15-29 ml/min (HCC)- (present on admission) -most recent Cr 2.77 in 2020. Currently at 3.2, suspect new baseline. Avoid nephrotoxic medications where possible  ICH (intracerebral hemorrhage) ([place])- (present on admission) -per neurosurgery, on Keppra, Depakote  Essential hypertension- (present on admission) -Continue Norvasc, Lasix. Monitor renal function  Moderate dementia with behavioral disturbance- (present on admission) -Patient with moderate to advanced  dementia, often does not recognize his son. MMSE 15/30 during 04/2021 neurology appointment. Continue home meds for behavorial issues - remeron, Namenda, Aricept, Depakote  Other hyperlipidemia- (present on admission) -Continue Lipitor  DNR (do not resuscitate)- (present on admission) -confirmed with son by admitting  BPH (benign prostatic hyperplasia) -continue flomax  Left displaced femoral neck fracture ([place]) -management per trauma team  Humerus fracture- (present on admission) -Ortho has consulted, sling recommended, non-surgical    Scheduled Meds:  acetaminophen  1,000 mg Oral Q6H   allopurinol  100 mg Oral QHS   amLODipine  10 mg Oral q morning   atorvastatin  20 mg Oral q morning   Chlorhexidine Gluconate Cloth  6 each Topical Daily   divalproex  500 mg Oral QPM   docusate sodium  100 mg Oral BID   donepezil  10 mg Oral QHS   furosemide  20 mg Oral Daily   gabapentin  100 mg Oral QHS   insulin aspart  0-5 Units Subcutaneous QHS   insulin aspart  0-9 Units Subcutaneous TID WC   memantine  10 mg Oral BID   metoprolol tartrate  12.5 mg Oral BID   mirtazapine  15 mg Oral QHS   tamsulosin  0.4 mg Oral QPM   vitamin B-12  1,000 mcg Oral q morning   Continuous Infusions:  sodium chloride 75 mL/hr at 10/08/2021 0700   levETIRAcetam Stopped (10/26/2021 0336)   PRN Meds:.diphenhydrAMINE, ipratropium, morphine injection, ondansetron **OR** ondansetron (ZOFRAN) IV, oxyCODONE  Diet Orders (From admission, onward)     Start     Ordered   11/02/2021 0001  Diet NPO time  specified  Diet effective midnight        10/11/2021 1616            DVT prophylaxis: SCDs Start: 10/27/2021 1513   Lab Results  Component Value Date   PLT 131 (L) 10/25/2021      Code Status: DNR  Family Communication: no family at bedside   Level of care: ICU  Procedures:  none  Microbiology  none  Antimicrobials: none    Objective: Vitals:   10/22/2021 0500 10/23/2021 0508 10/15/2021 0600  10/07/2021 0700  BP: (!) 197/155 (!) 175/50 137/74 (!) 166/122  Pulse: (!) 116  (!) 101 100  Resp: (!) 27  (!) 28 17  Temp:      TempSrc:      SpO2: 91%  93% 95%  Weight:      Height:        Intake/Output Summary (Last 24 hours) at 10/07/2021 0713 Last data filed at 10/11/2021 0700 Gross per 24 hour  Intake 630.54 ml  Output 325 ml  Net 305.54 ml   Wt Readings from Last 3 Encounters:  10/27/2021 66.7 kg  04/14/21 66.7 kg  06/25/19 70 kg    Examination:  Constitutional: comfortable, appears battered Eyes: left periorbital ecchimosis ENMT: Mucous membranes are moist.  Neck: normal, supple Respiratory: clear to auscultation bilaterally, no wheezing, no crackles. Normal respiratory effort.  Cardiovascular: irregular Abdomen: non distended, no tenderness.  Musculoskeletal: no clubbing / cyanosis.  Skin: no rashes Neurologic: doesn't follow commands but appears non focal .    Data Reviewed: I have independently reviewed following labs and imaging studies   CBC Recent Labs  Lab 10/22/2021 1223 10/26/2021 0457  WBC 11.0* 15.8*  HGB 10.1* 8.8*  HCT 31.3* 26.6*  PLT 156 131*  MCV 99.4 97.1  MCH 32.1 32.1  MCHC 32.3 33.1  RDW 14.8 14.9    Recent Labs  Lab 10/18/2021 1223 10/10/2021 0457  NA 142 144  K 4.8 4.2  CL 108 109  CO2 19* 19*  GLUCOSE 158* 184*  BUN 35* 41*  CREATININE 3.19* 3.18*  CALCIUM 8.5* 8.5*  AST 19  --   ALT 14  --   ALKPHOS 64  --   BILITOT 0.9  --   ALBUMIN 3.3*  --     ------------------------------------------------------------------------------------------------------------------ No results for input(s): CHOL, HDL, LDLCALC, TRIG, CHOLHDL, LDLDIRECT in the last 72 hours.  No results found for: HGBA1C ------------------------------------------------------------------------------------------------------------------ No results for input(s): TSH, T4TOTAL, T3FREE, THYROIDAB in the last 72 hours.  Invalid input(s): FREET3  Cardiac Enzymes No  results for input(s): CKMB, TROPONINI, MYOGLOBIN in the last 168 hours.  Invalid input(s): CK ------------------------------------------------------------------------------------------------------------------ No results found for: BNP  CBG: Recent Labs  Lab 10/23/2021 1215 10/20/2021 2226  GLUCAP 143* 309*    Recent Results (from the past 240 hour(s))  MRSA Next Gen by PCR, Nasal     Status: None   Collection Time: 10/28/2021  3:24 AM   Specimen: Nasal Mucosa; Nasal Swab  Result Value Ref Range Status   MRSA by PCR Next Gen NOT DETECTED NOT DETECTED Final    Comment: (NOTE) The GeneXpert MRSA Assay (FDA approved for NASAL specimens only), is one component of a comprehensive MRSA colonization surveillance program. It is not intended to diagnose MRSA infection nor to guide or monitor treatment for MRSA infections. Test performance is not FDA approved in patients less than 19 years old. Performed at [facility], [place] [address], [place], [place] [address]  Resp Panel by RT-PCR (Flu A&B, Covid) Nasopharyngeal Swab     Status: None   Collection Time: 10/11/2021  3:27 AM   Specimen: Nasopharyngeal Swab; Nasopharyngeal(NP) swabs in vial transport medium  Result Value Ref Range Status   SARS Coronavirus 2 by RT PCR NEGATIVE NEGATIVE Final    Comment: (NOTE) SARS-CoV-2 target nucleic acids are NOT DETECTED.  The SARS-CoV-2 RNA is generally detectable in upper respiratory specimens during the acute phase of infection. The lowest concentration of SARS-CoV-2 viral copies this assay can detect is 138 copies/mL. A negative result does not preclude SARS-Cov-2 infection and should not be used as the sole basis for treatment or other patient management decisions. A negative result may occur with  improper specimen collection/handling, submission of specimen other than nasopharyngeal swab, presence of viral mutation(s) within the areas targeted by this assay, and inadequate number of  viral copies(<138 copies/mL). A negative result must be combined with clinical observations, patient history, and epidemiological information. The expected result is Negative.  Fact Sheet for Patients:  EntrepreneurPulse.com.au  Fact Sheet for Healthcare Providers:  IncredibleEmployment.be  This test is no t yet approved or cleared by the [place] FDA and  has been authorized for detection and/or diagnosis of SARS-CoV-2 by FDA under an Emergency Use Authorization (EUA). This EUA will remain  in effect (meaning this test can be used) for the duration of the COVID-19 declaration under Section 564(b)(1) of the Act, 21 U.S.C.section 360bbb-3(b)(1), unless the authorization is terminated  or revoked sooner.       Influenza A by PCR NEGATIVE NEGATIVE Final   Influenza B by PCR NEGATIVE NEGATIVE Final    Comment: (NOTE) The Xpert Xpress SARS-CoV-2/FLU/RSV plus assay is intended as an aid in the diagnosis of influenza from Nasopharyngeal swab specimens and should not be used as a sole basis for treatment. Nasal washings and aspirates are unacceptable for Xpert Xpress SARS-CoV-2/FLU/RSV testing.  Fact Sheet for Patients: EntrepreneurPulse.com.au  Fact Sheet for Healthcare Providers: IncredibleEmployment.be  This test is not yet approved or cleared by the [place] FDA and has been authorized for detection and/or diagnosis of SARS-CoV-2 by FDA under an Emergency Use Authorization (EUA). This EUA will remain in effect (meaning this test can be used) for the duration of the COVID-19 declaration under Section 564(b)(1) of the Act, 21 U.S.C. section 360bbb-3(b)(1), unless the authorization is terminated or revoked.  Performed at [facility], [place] [address], [place], [place] [address]      Radiology Studies: DG Chest 1 View  Result Date: 10/15/2021 CLINICAL DATA:  Fall, witness syncopal episode  at home while walking in hallway, unconscious for several minutes, history of dementia with baseline confusion, complaining of LEFT shoulder pain EXAM: CHEST  1 VIEW COMPARISON:  03/20/2019 FINDINGS: Normal heart size, mediastinal contours, and pulmonary vascularity. Atherosclerotic calcification aorta. Chronic RIGHT basilar subsegmental atelectasis. No acute infiltrate, pleural effusion, or pneumothorax. Osseous demineralization with displaced fracture at surgical neck of LEFT humerus. IMPRESSION: RIGHT basilar atelectasis. Displaced fracture at surgical neck LEFT humerus. Aortic Atherosclerosis (ICD10-I70.0). Electronically Signed   By: Lavonia Dana M.D.   On: 10/20/2021 13:05   CT Head Wo Contrast  Result Date: 10/27/2021 CLINICAL DATA:  Head trauma, minor (Age >= 65y); Facial trauma, blunt EXAM: CT HEAD WITHOUT CONTRAST CT MAXILLOFACIAL WITHOUT CONTRAST TECHNIQUE: Multidetector CT imaging of the head and maxillofacial structures were performed using the standard protocol without intravenous contrast. Multiplanar CT image reconstructions of the maxillofacial structures were also generated.  RADIATION DOSE REDUCTION: This exam was performed according to the departmental dose-optimization program which includes automated exposure control, adjustment of the mA and/or kV according to patient size and/or use of iterative reconstruction technique. COMPARISON:  None. FINDINGS: CT HEAD FINDINGS Brain: Small volume of acute subarachnoid hemorrhage overlying bilateral frontal convexities and the right temporal convexity. Additional small (4 mm thick) spine acute probably extra-axial hemorrhage along the left frontal convexity (favor subdural over epidural). Trace intraventricular hemorrhage layering in the left occipital lobe. No evidence of acute hemorrhage, mass effect, midline shift, mass lesion. Vascular: Calcific atherosclerosis. No hyperdense vessel identified. Skull: No evidence of acute fracture. Other: No mastoid  effusions. CT MAXILLOFACIAL FINDINGS Motion limited.  Within this limitation Osseous: No fracture or mandibular dislocation. No destructive process. Orbits: Negative. No traumatic or inflammatory finding. Sinuses: Mild paranasal sinus mucosal thickening. No air-fluid levels. Soft tissues: Negative. IMPRESSION: 1. Multifocal, multi compartment hemorrhage including small volume of bifrontal and right temporal subarachnoid hemorrhage, additional left frontal extra-axial hemorrhage (favor subdural over epidural), and trace intraventricular hemorrhage. 2. Motion limited maxillofacial CT without evidence of acute fracture. Findings discussed with Dr. Verner Chol via telephone at 1:40 p.m. Electronically Signed   By: Margaretha Sheffield M.D.   On: 11/01/2021 13:43   CT Cervical Spine Wo Contrast  Result Date: 10/20/2021 CLINICAL DATA:  Neck trauma, fall, loss of consciousness, bruising to LEFT eye and LEFT side of face, EXAM: CT CERVICAL SPINE WITHOUT CONTRAST TECHNIQUE: Multidetector CT imaging of the cervical spine was performed without intravenous contrast. Multiplanar CT image reconstructions were also generated. RADIATION DOSE REDUCTION: This exam was performed according to the departmental dose-optimization program which includes automated exposure control, adjustment of the mA and/or kV according to patient size and/or use of iterative reconstruction technique. COMPARISON:  11/06/2012 FINDINGS: Alignment: Normal Skull base and vertebrae: Osseous demineralization. Skull base intact. Head rotated on exam. Multilevel facet degenerative changes. Disc space narrowing and endplate spur formation at C5-C7. No fracture, subluxation, or bone destruction. Uncovertebral spurs encroach upon cervical neural foramina bilaterally at C5-C6. Bone island at T7 vertebral body LEFT of midline unchanged. Soft tissues and spinal canal: Prevertebral soft tissues normal thickness. Atherosclerotic calcification of internal carotid,  vertebral, and common carotid arteries bilaterally including carotid bifurcations. No proximal great vessel atherosclerotic calcifications. Air-filled cervical esophagus and upper thorax. Disc levels:  No specific abnormalities Upper chest: Small subpleural lipoma at LEFT apex, 3.3 x 2.3 cm. Lung apices otherwise clear. Other: N/A IMPRESSION: Multilevel degenerative disc and facet disease changes of the cervical spine. No acute cervical spine abnormalities. Subpleural lipoma LEFT apex 3.3 x 2.3 cm in size. Extensive atherosclerotic calcifications. Electronically Signed   By: Lavonia Dana M.D.   On: 10/18/2021 13:46   DG Shoulder Left  Result Date: 11/02/2021 CLINICAL DATA:  Witnessed syncopal episode at home walking in the hallway. Fall. Unconscious for several minutes. Left shoulder pain. EXAM: LEFT SHOULDER - 2+ VIEW COMPARISON:  Left shoulder radiographs 11/06/2012 FINDINGS: There is a new oblique fracture of the left proximal humerus surgical neck extending through the proximal diaphysis in a proximal-lateral to distal-medial orientation. Mild anterior apex angulation of the fracture line on lateral transscapular Y-view. Minimal medial apex angulation of the fracture on frontal view. Mild glenohumeral and acromioclavicular osteoarthritis. The visualized left lung is unremarkable. IMPRESSION: Mildly angulated proximal left humeral surgical neck and diaphyseal fracture. Electronically Signed   By: Yvonne Kendall M.D.   On: 10/09/2021 13:05   DG Hip Unilat W or Wo Pelvis 2-3  Views Left  Result Date: 10/17/2021 CLINICAL DATA:  Left hip pain following a fall. EXAM: DG HIP (WITH OR WITHOUT PELVIS) 2-3V LEFT COMPARISON:  None. FINDINGS: Left femoral neck fracture with varus angulation and proximal displacement of the distal fragment. Diffuse osteopenia. Lower lumbar spine degenerative changes. Extensive arterial calcifications. IMPRESSION: 1. Left femoral neck fracture with varus angulation. 2. Extensive arterial  calcifications compatible with the history of chronic kidney disease. Electronically Signed   By: Claudie Revering M.D.   On: 10/27/2021 16:10   CT Maxillofacial Wo Contrast  Result Date: 10/27/2021 CLINICAL DATA:  Head trauma, minor (Age >= 65y); Facial trauma, blunt EXAM: CT HEAD WITHOUT CONTRAST CT MAXILLOFACIAL WITHOUT CONTRAST TECHNIQUE: Multidetector CT imaging of the head and maxillofacial structures were performed using the standard protocol without intravenous contrast. Multiplanar CT image reconstructions of the maxillofacial structures were also generated. RADIATION DOSE REDUCTION: This exam was performed according to the departmental dose-optimization program which includes automated exposure control, adjustment of the mA and/or kV according to patient size and/or use of iterative reconstruction technique. COMPARISON:  None. FINDINGS: CT HEAD FINDINGS Brain: Small volume of acute subarachnoid hemorrhage overlying bilateral frontal convexities and the right temporal convexity. Additional small (4 mm thick) spine acute probably extra-axial hemorrhage along the left frontal convexity (favor subdural over epidural). Trace intraventricular hemorrhage layering in the left occipital lobe. No evidence of acute hemorrhage, mass effect, midline shift, mass lesion. Vascular: Calcific atherosclerosis. No hyperdense vessel identified. Skull: No evidence of acute fracture. Other: No mastoid effusions. CT MAXILLOFACIAL FINDINGS Motion limited.  Within this limitation Osseous: No fracture or mandibular dislocation. No destructive process. Orbits: Negative. No traumatic or inflammatory finding. Sinuses: Mild paranasal sinus mucosal thickening. No air-fluid levels. Soft tissues: Negative. IMPRESSION: 1. Multifocal, multi compartment hemorrhage including small volume of bifrontal and right temporal subarachnoid hemorrhage, additional left frontal extra-axial hemorrhage (favor subdural over epidural), and trace  intraventricular hemorrhage. 2. Motion limited maxillofacial CT without evidence of acute fracture. Findings discussed with Dr. Verner Chol via telephone at 1:40 p.m. Electronically Signed   By: Margaretha Sheffield M.D.   On: 10/15/2021 13:43     Marzetta Board, MD, PhD [facility]  Between 7 am - 7 pm I am available, please contact me via Amion (for emergencies) or Securechat (non urgent messages)  Between 7 pm - 7 am I am not available, please contact night coverage MD/APP via Amion

## 2021-10-13 NOTE — Evaluation (Signed)
Occupational Therapy Evaluation Patient Details Name: Samuel [name] MRN: [id number] DOB: 1930/01/24 Today's Date: 10/23/2021   History of Present Illness Pt is 88 yo male who presents after syncopal vs seizure episode at home with a fall.Pt with L humeral neck fx (non-op mgmt with sling) and L displaced femoral neck fx.  CT showed multiple small ICH. PMH: dementia, DM, HTN, IDA, CKD, BPH   Clinical Impression   Pt is typically mod I for ADL and mobility (per patient and chart review - although not confirmed with family). TOday he is limited by pain in L side. He was max to total A of 2 for all aspects of bed mobility today and unable to attempt any transfers. He was able to participate in seated grooming with max A for seated position. He requires assist for all aspects of Bil hand tasks at this time. Please see UE eval section for information on sling from todays session. At this time Pt will require SNF level care post-acute. This therapist is aware that family is having conversations with Palliative medicine (scheduled for this afternoon) and due to his dementia may prefer to take him home. He will require max A along with hospital bed and potentially lift equipment if this is what the family elects to do. OT will continue to follow acutely - overall he is max A for all aspects of ADL at this time (although he is R handed -  which is helpful). Next session to attempt transfer to recliner and compensatory strategies for ADL      Recommendations for follow up therapy are one component of a multi-disciplinary discharge planning process, led by the attending physician.  Recommendations may be updated based on patient status, additional functional criteria and insurance authorization.   Follow Up Recommendations  Skilled nursing-short term rehab (<3 hours/day) (may go home with [facility] due to dementia)    Assistance Recommended at Discharge Frequent or constant Supervision/Assistance  Patient can  return home with the following Two people to help with walking and/or transfers;A lot of help with bathing/dressing/bathroom;Assistance with feeding;Assistance with cooking/housework;Assist for transportation;Help with stairs or ramp for entrance;Other (comment) (lift equipment)    Functional Status Assessment  Patient has had a recent decline in their functional status and/or demonstrates limited ability to make significant improvements in function in a reasonable and predictable amount of time  Equipment Recommendations  Hospital bed;Wheelchair cushion (measurements OT);Wheelchair (measurements OT);BSC/3in1;Other (comment) [employer]))    Recommendations for Other Services PT consult     Precautions / Restrictions Precautions Precautions: Fall Required Braces or Orthoses: Sling Restrictions Weight Bearing Restrictions: Yes LUE Weight Bearing: Non weight bearing LLE Weight Bearing: Non weight bearing      Mobility Bed Mobility Overal bed mobility: Needs Assistance Bed Mobility: Supine to Sit, Sit to Supine     Supine to sit: Max assist, +2 for physical assistance Sit to supine: Max assist, +2 for physical assistance   General bed mobility comments: pt painful with all mvmt, needed full support pivoting to EOB and returning to supine    Transfers                   General transfer comment: unable to tolerate today      Balance Overall balance assessment: Needs assistance, History of Falls Sitting-balance support: Single extremity supported, Feet supported Sitting balance-Leahy Scale: Zero Sitting balance - Comments: max A due to posterior lean, pt had difficulty wt shifting anterior due to L hip  pain Postural control: Posterior lean, Right lateral lean                                 ADL either performed or assessed with clinical judgement   ADL Overall ADL's : Needs assistance/impaired Eating/Feeding: Minimal assistance;Bed level  (HOB elevated)   Grooming: Wash/dry face;Minimal assistance;Sitting Grooming Details (indicate cue type and reason): requires assist for all Bil hand activities Upper Body Bathing: Maximal assistance   Lower Body Bathing: Maximal assistance   Upper Body Dressing : Maximal assistance   Lower Body Dressing: Total assistance   Toilet Transfer: Total assistance Toilet Transfer Details (indicate cue type and reason): clinical judgement - not completed this session Toileting- Clothing Manipulation and Hygiene: Total assistance         General ADL Comments: R handed (L arm affected), but significant assist needed for all aspects of ADL     Vision Baseline Vision/History: 1 Wears glasses (reading only per Pt) Ability to See in Adequate Light: 0 Adequate Additional Comments: L eye contusion     Perception     Praxis      Pertinent Vitals/Pain Pain Assessment Pain Assessment: Faces Faces Pain Scale: Hurts whole lot Pain Location: L shoulder and L hip Pain Descriptors / Indicators: Aching, Sore Pain Intervention(s): Limited activity within patient's tolerance, Monitored during session, Repositioned     Hand Dominance Right   Extremity/Trunk Assessment Upper Extremity Assessment Upper Extremity Assessment: LUE deficits/detail LUE Deficits / Details: non-operative management. No ROM protocol in the chart so left immobilized, removed sling this session because of lacerations/bleeding it had become adhered to the skin on his forearm. RN present and assisted with removal of sling and placing bandaging. Sling then too wet to replace. RN ordered another sling. LUE VERY painful with any movement LUE: Unable to fully assess due to pain;Unable to fully assess due to immobilization LUE Coordination: decreased fine motor;decreased gross motor   Lower Extremity Assessment Lower Extremity Assessment: Defer to PT evaluation RLE Deficits / Details: hip flex >3/5, knee ext >3/5 RLE: Unable to  fully assess due to pain RLE Sensation: WNL RLE Coordination: WNL LLE Deficits / Details: painful with all mvmt of LLE. Unable to lift LLE, able to extend knee in sitting 2/5 LLE: Unable to fully assess due to pain LLE Coordination: decreased gross motor   Cervical / Trunk Assessment Cervical / Trunk Assessment: Kyphotic   Communication Communication Communication: No difficulties   Cognition Arousal/Alertness: Lethargic, Suspect due to medications Behavior During Therapy: WFL for tasks assessed/performed Overall Cognitive Status: History of cognitive impairments - at baseline                                 General Comments: oriented to self and location, assessment limited by pt's lethargy and pain     General Comments  VSS, pt with L arm skin tear, dressed by RN while pt sitting    Exercises     Shoulder Instructions      Home Living Family/patient expects to be discharged to:: Private residence Living Arrangements: Spouse/significant other Available Help at Discharge: Family;Available 24 hours/day                             Additional Comments: per chart pt lives with wife of 57 yrs and son. Pt  not a reliable historian      Prior Functioning/Environment Prior Level of Function : Needs assist;Driving             Mobility Comments: pt reports he ambulated without AD. Chart states that he still drives but only with someone so he doesn't get lost ADLs Comments: pt reports that he bathes and dresses himself        OT Problem List: Decreased strength;Decreased range of motion;Decreased activity tolerance;Impaired balance (sitting and/or standing);Decreased cognition;Decreased safety awareness;Decreased knowledge of use of DME or AE;Decreased knowledge of precautions;Impaired UE functional use;Pain      OT Treatment/Interventions: Self-care/ADL training;DME and/or AE instruction;Manual therapy;Therapeutic activities;Patient/family  education;Balance training    OT Goals(Current goals can be found in the care plan section) Acute Rehab OT Goals Patient Stated Goal: decrease pain OT Goal Formulation: With patient Time For Goal Achievement: 10/27/21 Potential to Achieve Goals: Fair ADL Goals Pt Will Perform Grooming: with set-up;sitting Pt Will Perform Upper Body Dressing: with min assist;with caregiver independent in assisting;sitting Pt Will Perform Lower Body Dressing: with max assist;sitting/lateral leans Pt Will Transfer to Toilet: with max assist;with +2 assist;bedside commode Pt Will Perform Toileting - Clothing Manipulation and hygiene: with mod assist;sitting/lateral leans;with caregiver independent in assisting Additional ADL Goal #1: Pt will don/doff sling for LUE at mod A  OT Frequency: Min 2X/week    Co-evaluation PT/OT/SLP Co-Evaluation/Treatment: Yes Reason for Co-Treatment: Complexity of the patient's impairments (multi-system involvement);Necessary to address cognition/behavior during functional activity;For patient/therapist safety;To address functional/ADL transfers PT goals addressed during session: Mobility/safety with mobility;Balance;Strengthening/ROM OT goals addressed during session: ADL's and self-care;Strengthening/ROM      AM-PAC OT "6 Clicks" Daily Activity     Outcome Measure Help from another person eating meals?: A Little Help from another person taking care of personal grooming?: A Little Help from another person toileting, which includes using toliet, bedpan, or urinal?: Total Help from another person bathing (including washing, rinsing, drying)?: A Lot Help from another person to put on and taking off regular upper body clothing?: A Lot Help from another person to put on and taking off regular lower body clothing?: Total 6 Click Score: 12   End of Session Equipment Utilized During Treatment: Other (comment) (sling) Nurse Communication: Mobility status;Precautions;Weight bearing  status;Need for lift equipment  Activity Tolerance: Patient limited by pain Patient left: in bed;with call bell/phone within reach;with bed alarm set;with SCD's reapplied  OT Visit Diagnosis: Unsteadiness on feet (R26.81);Other abnormalities of gait and mobility (R26.89);History of falling (Z91.81);Muscle weakness (generalized) (M62.81);Feeding difficulties (R63.3);Other symptoms and signs involving the nervous system (R29.898);Other symptoms and signs involving cognitive function;Pain Pain - Right/Left: Left Pain - part of body: Hip;Shoulder                Time: 1093-2355 OT Time Calculation (min): 31 min Charges:  OT General Charges $OT Visit: 1 Visit OT Evaluation $OT Eval Moderate Complexity: Singac OTR/L Acute Rehabilitation Services Pager: [phone] Office: [place] 10/23/2021, 12:46 PM

## 2021-10-13 NOTE — Transfer of Care (Signed)
Immediate Anesthesia Transfer of Care Note  Patient: Samuel [name]  Procedure(s) Performed: ARTHROPLASTY BIPOLAR HIP (HEMIARTHROPLASTY) (Left: Hip)  Patient Location: PACU  Anesthesia Type:General  Level of Consciousness: drowsy  Airway & Oxygen Therapy: Patient Spontanous Breathing and Patient connected to face mask oxygen  Post-op Assessment: Report given to RN and Post -op Vital signs reviewed and stable  Post vital signs: Reviewed and stable  Last Vitals:  Vitals Value Taken Time  BP 133/71 10/11/2021 1848  Temp    Pulse 71 10/17/2021 1849  Resp 14 10/09/2021 1851  SpO2 100 % 10/09/2021 1849  Vitals shown include unvalidated device data.  Last Pain:  Vitals:   10/10/2021 1200  TempSrc: Axillary  PainSc:          Complications: No notable events documented.

## 2021-10-13 NOTE — Progress Notes (Signed)
Patient ID: Samuel [name], male   DOB: November 04, 1929, 88 y.o.   MRN: [id number] Follow up - Trauma Critical Care   Patient Details:    Samuel [name] is an 88 y.o. male.  Lines/tubes : External Urinary Catheter (Active)  Collection Container Standard drainage bag 10/18/2021 0400  Site Assessment Clean;Intact 10/21/2021 0400  Output (mL) 100 mL 10/27/2021 0600    Microbiology/Sepsis markers: Results for orders placed or performed during the hospital encounter of 10/24/2021  MRSA Next Gen by PCR, Nasal     Status: None   Collection Time: 11/01/2021  3:24 AM   Specimen: Nasal Mucosa; Nasal Swab  Result Value Ref Range Status   MRSA by PCR Next Gen NOT DETECTED NOT DETECTED Final    Comment: (NOTE) The GeneXpert MRSA Assay (FDA approved for NASAL specimens only), is one component of a comprehensive MRSA colonization surveillance program. It is not intended to diagnose MRSA infection nor to guide or monitor treatment for MRSA infections. Test performance is not FDA approved in patients less than 52 years old. Performed at [facility], [place] [address] [place], [place] [address]   Resp Panel by RT-PCR (Flu A&B, Covid) Nasopharyngeal Swab     Status: None   Collection Time: 11/02/2021  3:27 AM   Specimen: Nasopharyngeal Swab; Nasopharyngeal(NP) swabs in vial transport medium  Result Value Ref Range Status   SARS Coronavirus 2 by RT PCR NEGATIVE NEGATIVE Final    Comment: (NOTE) SARS-CoV-2 target nucleic acids are NOT DETECTED.  The SARS-CoV-2 RNA is generally detectable in upper respiratory specimens during the acute phase of infection. The lowest concentration of SARS-CoV-2 viral copies this assay can detect is 138 copies/mL. A negative result does not preclude SARS-Cov-2 infection and should not be used as the sole basis for treatment or other patient management decisions. A negative result may occur with  improper specimen collection/handling, submission of specimen other than  nasopharyngeal swab, presence of viral mutation(s) within the areas targeted by this assay, and inadequate number of viral copies(<138 copies/mL). A negative result must be combined with clinical observations, patient history, and epidemiological information. The expected result is Negative.  Fact Sheet for Patients:  EntrepreneurPulse.com.au  Fact Sheet for Healthcare Providers:  IncredibleEmployment.be  This test is no t yet approved or cleared by the [place] FDA and  has been authorized for detection and/or diagnosis of SARS-CoV-2 by FDA under an Emergency Use Authorization (EUA). This EUA will remain  in effect (meaning this test can be used) for the duration of the COVID-19 declaration under Section 564(b)(1) of the Act, 21 U.S.C.section 360bbb-3(b)(1), unless the authorization is terminated  or revoked sooner.       Influenza A by PCR NEGATIVE NEGATIVE Final   Influenza B by PCR NEGATIVE NEGATIVE Final    Comment: (NOTE) The Xpert Xpress SARS-CoV-2/FLU/RSV plus assay is intended as an aid in the diagnosis of influenza from Nasopharyngeal swab specimens and should not be used as a sole basis for treatment. Nasal washings and aspirates are unacceptable for Xpert Xpress SARS-CoV-2/FLU/RSV testing.  Fact Sheet for Patients: EntrepreneurPulse.com.au  Fact Sheet for Healthcare Providers: IncredibleEmployment.be  This test is not yet approved or cleared by the [place] FDA and has been authorized for detection and/or diagnosis of SARS-CoV-2 by FDA under an Emergency Use Authorization (EUA). This EUA will remain in effect (meaning this test can be used) for the duration of the COVID-19 declaration under Section 564(b)(1) of the Act, 21 U.S.C. section  360bbb-3(b)(1), unless the authorization is terminated or revoked.  Performed at [facility], [place] [address], [place], [place] [address]      Anti-infectives:  Anti-infectives (From admission, onward)    None      Consults: Treatment Team:  Caren Griffins, MD Eustace Moore, MD    Studies:    Events:  Subjective:    Overnight Issues:   Objective:  Vital signs for last 24 hours: Temp:  [97.9 F (36.6 C)-98.6 F (37 C)] 98.1 F (36.7 C) (02/07 0336) Pulse Rate:  [89-116] 100 (02/07 0700) Resp:  [15-34] 17 (02/07 0700) BP: (80-197)/(33-155) 171/61 (02/07 0713) SpO2:  [89 %-100 %] 95 % (02/07 0700) Weight:  [66.7 kg] 66.7 kg (02/06 1200)  Hemodynamic parameters for last 24 hours:    Intake/Output from previous day: 02/06 0701 - 02/07 0700 In: 630.5 [I.V.:430.5; IV Piggyback:200] Out: 325 [Urine:325]  Intake/Output this shift: No intake/output data recorded.  Vent settings for last 24 hours:    Physical Exam:  General: no respiratory distress Neuro: arouses and follows some commands HEENT/Neck: L facial contusion periorbital Resp: clear to auscultation bilaterally CVS: IRR GI: soft, NT, ND Extremities: sling LUE, L hip tender  Results for orders placed or performed during the hospital encounter of 10/20/2021 (from the past 24 hour(s))  CBG monitoring, ED     Status: Abnormal   Collection Time: 10/19/2021 12:15 PM  Result Value Ref Range   Glucose-Capillary 143 (H) 70 - 99 mg/dL  Comprehensive metabolic panel     Status: Abnormal   Collection Time: 10/11/2021 12:23 PM  Result Value Ref Range   Sodium 142 135 - 145 mmol/L   Potassium 4.8 3.5 - 5.1 mmol/L   Chloride 108 98 - 111 mmol/L   CO2 19 (L) 22 - 32 mmol/L   Glucose, Bld 158 (H) 70 - 99 mg/dL   BUN 35 (H) 8 - 23 mg/dL   Creatinine, Ser 3.19 (H) 0.61 - 1.24 mg/dL   Calcium 8.5 (L) 8.9 - 10.3 mg/dL   Total Protein 6.1 (L) 6.5 - 8.1 g/dL   Albumin 3.3 (L) 3.5 - 5.0 g/dL   AST 19 15 - 41 U/L   ALT 14 0 - 44 U/L   Alkaline Phosphatase 64 38 - 126 U/L   Total Bilirubin 0.9 0.3 - 1.2 mg/dL   GFR, Estimated 18 (L) >60 mL/min   Anion  gap 15 5 - 15  CBC     Status: Abnormal   Collection Time: 10/27/2021 12:23 PM  Result Value Ref Range   WBC 11.0 (H) 4.0 - 10.5 K/uL   RBC 3.15 (L) 4.22 - 5.81 MIL/uL   Hemoglobin 10.1 (L) 13.0 - 17.0 g/dL   HCT 31.3 (L) 39.0 - 52.0 %   MCV 99.4 80.0 - 100.0 fL   MCH 32.1 26.0 - 34.0 pg   MCHC 32.3 30.0 - 36.0 g/dL   RDW 14.8 11.5 - 15.5 %   Platelets 156 150 - 400 K/uL   nRBC 0.0 0.0 - 0.2 %  Glucose, capillary     Status: Abnormal   Collection Time: 10/30/2021 10:26 PM  Result Value Ref Range   Glucose-Capillary 309 (H) 70 - 99 mg/dL   Comment 1 Notify RN    Comment 2 Document in Chart   MRSA Next Gen by PCR, Nasal     Status: None   Collection Time: 10/29/2021  3:24 AM   Specimen: Nasal Mucosa; Nasal Swab  Result  Value Ref Range   MRSA by PCR Next Gen NOT DETECTED NOT DETECTED  Resp Panel by RT-PCR (Flu A&B, Covid) Nasopharyngeal Swab     Status: None   Collection Time: 10/08/2021  3:27 AM   Specimen: Nasopharyngeal Swab; Nasopharyngeal(NP) swabs in vial transport medium  Result Value Ref Range   SARS Coronavirus 2 by RT PCR NEGATIVE NEGATIVE   Influenza A by PCR NEGATIVE NEGATIVE   Influenza B by PCR NEGATIVE NEGATIVE  CBC     Status: Abnormal   Collection Time: 10/30/2021  4:57 AM  Result Value Ref Range   WBC 15.8 (H) 4.0 - 10.5 K/uL   RBC 2.74 (L) 4.22 - 5.81 MIL/uL   Hemoglobin 8.8 (L) 13.0 - 17.0 g/dL   HCT 26.6 (L) 39.0 - 52.0 %   MCV 97.1 80.0 - 100.0 fL   MCH 32.1 26.0 - 34.0 pg   MCHC 33.1 30.0 - 36.0 g/dL   RDW 14.9 11.5 - 15.5 %   Platelets 131 (L) 150 - 400 K/uL   nRBC 0.0 0.0 - 0.2 %  Basic metabolic panel     Status: Abnormal   Collection Time: 10/11/2021  4:57 AM  Result Value Ref Range   Sodium 144 135 - 145 mmol/L   Potassium 4.2 3.5 - 5.1 mmol/L   Chloride 109 98 - 111 mmol/L   CO2 19 (L) 22 - 32 mmol/L   Glucose, Bld 184 (H) 70 - 99 mg/dL   BUN 41 (H) 8 - 23 mg/dL   Creatinine, Ser 3.18 (H) 0.61 - 1.24 mg/dL   Calcium 8.5 (L) 8.9 - 10.3 mg/dL   GFR,  Estimated 18 (L) >60 mL/min   Anion gap 16 (H) 5 - 15  Glucose, capillary     Status: Abnormal   Collection Time: 10/25/2021  8:04 AM  Result Value Ref Range   Glucose-Capillary 180 (H) 70 - 99 mg/dL    Assessment & Plan: Present on Admission:  ICH (intracerebral hemorrhage) ([place])  Other hyperlipidemia  Moderate dementia with behavioral disturbance  Essential hypertension  Humerus fracture  CKD (chronic kidney disease) stage 4, GFR 15-29 ml/min (HCC)  DNR (do not resuscitate)    LOS: 1 day   Additional comments:I reviewed the patient's new clinical lab test results. And x-rays Fall Multifocal intracranial hemorrhage -Per Dr. Ronnald Ramp.  No acute intervention warranted.  No follow-up head CT unless exam declines. Left humeral fracture -Per Dr. Doreatha Martin, nonop in a sling. Left femoral neck fracture -Ortho to see this AM, NPO for now Left facial ecchymosis Type 2 diabetes mellitus -appreciate TRH CKD stage 4 - appreciate TRH HTN - appreciate TRH Multifocal atrial tachycardia - appreciate TRH BPH - flomax Baseline dementia -resume home meds CKD -follow HTN -monitor FEN - NPO for ?OR VTE - on hold due to head bleed ID - none currently needed Dispo - ICU, Ortho eval, Palliative Care consult appreciated and they plan to meet with his family this afternoon. Critical Care Total Time*: 35 Minutes  Georganna Skeans, MD, MPH, FACS Trauma & General Surgery Use [email or web address] to contact on call provider  10/21/2021  *Care during the described time interval was provided by me. I have reviewed this patient's available data, including medical history, events of note, physical examination and test results as part of my evaluation.

## 2021-10-13 NOTE — Progress Notes (Signed)
SLP Cancellation Note  Patient Details Name: Samuel [name] MRN: [id number] DOB: 1930/09/03   Cancelled treatment:        Pt currently in surgery. Will complete  speech-cognitive eval when able.                                                                                                 [place] Siren 10/29/2021, 2:50 PM

## 2021-10-13 NOTE — Hospital Course (Signed)
88 y.o. male with medical history significant of BPH, CKD, DM, dementia (still driving with supervising family), HTN, and HLD presenting with a fall. The morning of admission, the patient was thrashing about in bed and his wife was concerned that he was having a "diabetic seizure."  He has reportedly had one prior.  He is no longer on insulin and just "sometimes" takes a pill.  This morning, his wife went to get him a "sugar pill" and then when that didn't help she went to get him another.   While she was gone, he got out of the bed and fell. At baseline he ambulates without assistance, family has occasional caregiver.

## 2021-10-13 NOTE — Op Note (Addendum)
10/11/2021  6:10 PM  PATIENT:  Samuel [name]   MRN: [id number]  PRE-OPERATIVE DIAGNOSIS: Left displaced femoral neck fracture  POST-OPERATIVE DIAGNOSIS:  Left displaced femoral neck fracture  PROCEDURE:  Procedure(s): ARTHROPLASTY BIPOLAR HIP (HEMIARTHROPLASTY)  PREOPERATIVE INDICATIONS:  [name] is an 88 y.o. male who was admitted 10/27/2021 with a diagnosis of Left displaced femoral neck fractureand elected for surgical management.  The risks benefits and alternatives were discussed with the patient including but not limited to the risks of nonoperative treatment, versus surgical intervention including infection, bleeding, nerve injury, periprosthetic fracture, the need for revision surgery, dislocation, leg length discrepancy, blood clots, cardiopulmonary complications, morbidity, mortality, among others, and they were willing to proceed.  Predicted outcome is good, although there will be at least a six to nine month expected recovery.   OPERATIVE REPORT     SURGEON:  Charlies Constable, MD    ASSISTANT:  Izola Price, RNFA  (Present throughout the entire procedure,  necessary for completion of procedure in a timely manner, assisting with retraction, instrumentation, and closure)     ANESTHESIA:  general  ESTIMATED BLOOD LOSS: 025KY    COMPLICATIONS:  None.   UNIQUE ASPECTS OF THE CASE: none     COMPONENTS:  Accollade C size 4, 28+4 mm cobalt chrome inner ball, 73m bipolar outer ball Implant Name Type Inv. Item Serial No. Manufacturer Lot No. LRB No. Used Action  CEMENT BONE SIMPLEX SPEEDSET - [id number] CEMENT BONE SIMPLEX SPEEDSET  [employer] [id number] Left 2 Implanted  STEM HIP ACCOLADE SZ4 35X137 - [id number] STEM HIP ACCOLADE SZ4 [id number] [employer]  Left 1 Implanted  RESTRICTOR CEMENT PE SZ 2 - [id number] RESTRICTOR CEMENT PE SZ 2  [employer] [id number] Left 1 Implanted  HEAD OSTEO BIPOLAR - [id number] HEAD OSTEO BIPOLAR   [employer] [id number] Left 1 Implanted  FEMORAL HEAD LFIT V40 28MM P4 - [id number] FEMORAL HEAD LFIT V40 28MM P4  [employer] [id number] 1 Implanted      PROCEDURE IN DETAIL: The patient was met in the holding area and identified.  The appropriate hip  was marked at the operative site. The patient was then transported to the OR and  placed under anesthesia.  At that point, the patient was  placed in the lateral decubitus position with the operative side up and  secured to the operating room table and all bony prominences padded. A subaxillary role was placed.    The operative lower extremity was prepped from the iliac crest to the ankle.  Sterile draping was performed.  2g of ancef and 1g TXA were given prior to incision. Time out was performed prior to incision.      A routine posterolateral approach was utilized via sharp dissection  carried down to the subcutaneous tissue.  Gross bleeders were Bovie  coagulated.  The iliotibial band was identified and incised  along the length of the skin incision.  A Charnley retractor was inserted with care to protect the sciatic nerve.  With the hip internally rotated, the short external rotators  were identified. The piriformis was tagged with #5 Ethibond, and the hip capsule released in a T-type fashion, and posterior sleeve of the capsule was also tagged.  The femoral neck was exposed, and I resected the femoral neck using the appropriate jig. This was performed at approximately a thumb's breadth above the lesser trochanter.    I then exposed the deep acetabulum, cleared out any tissue  including the ligamentum teres.    I then prepared the proximal femur using the box cutter, Charnley awl, and then sequentially broached.  A trial utilized, and I reduced the hip, leg lengths were assessed clinically and felt to be equal. The hip was then taken through a full range of motion, the hip was stable at full extension and 90 degrees external  rotation without anterior subluxation. The hip was also stable in the position of sleep, and in neutral abduction up to 90 degrees flexion, and 80 degrees IR. The trial components were then removed.   We then prepared canal for cementation.  The cement restrictor was measured and inserted distally.  The canal was then irrigated with the pulse lavage and 3 L of normal saline.  2 bags of Simplex cement were prepared.  Using the cement gun the cement was inserted distally and the canal was filled.  We then pressurized the canal. The real implant was then inserted matching the patient's native anteversion of approximately 30 degrees.  We then waited for 13 minutes for the cement to be fully set.  Excess cement was removed.  A lap was placed in the acetabulum prior to cementing was also removed and the acetabulum was assessed to make sure there was no cement or bone fragments.  The hip was then reduced with the trial head again and taken through functional range of motion and found to have excellent stability. Leg lengths were restored. The real head was then impacted onto the stem and the hip was again reduced.  The capsule was then repaired with #2 Ethibond., and the piriformis was repaired to the abductor tendon.  His. Excellent posterior capsular repair was achieved.   I then irrigated the hip copiously again with pulse lavage. The wounds were injected with 20cc exparal diluted in sterile saline. The fascia and IT band was repaired with #1 stratafix, followed by 0 vicryl for the fat layer followed by 2-0 Vicryl and running 3-0 Monocryl for the skin, Dermabond was applied and an Aquacel dressing was placed.  The patient was then awakened and returned to PACU in stable and satisfactory condition. There were no complications.  Post op recs: WB: WBAT LLE, NWB LUE in sling Abx: ancef x23 hours post op Imaging: PACU xrays Dressing: Aquacel dressing to be kept intact until follow-up DVT prophylaxis: lovenox  starting POD1 x4 weeks Follow up: 2 weeks after surgery for a wound check with Dr. Zachery Dakins at [facility]  Address: [address] [place], [place], [place] [address]  Office Phone: [phone]   Charlies Constable, MD Orthopedic Surgeon  10/25/2021 6:10 PM

## 2021-10-13 NOTE — Progress Notes (Signed)
Left lower arm laceration not seen yesterday. Wound dressed, arm immobilizer replaced.

## 2021-10-13 NOTE — Progress Notes (Signed)
Inpatient Diabetes Program Recommendations  AACE/ADA: New Consensus Statement on Inpatient Glycemic Control (2015)  Target Ranges:  Prepandial:   less than 140 mg/dL      Peak postprandial:   less than 180 mg/dL (1-2 hours)      Critically ill patients:  140 - 180 mg/dL   Lab Results  Component Value Date   GLUCAP 142 (H) 10/28/2021    Review of Glycemic Control  Latest Reference Range & Units 11/01/2021 08:04 10/21/2021 11:26  Glucose-Capillary 70 - 99 mg/dL 180 (H) 142 (H)   Diabetes history: DM  Outpatient Diabetes medications:  Amaryl 1 mg daily, Januvia 50 mg daily Current orders for Inpatient glycemic control:  Novolog sensitive tid with meals and HS  Inpatient Diabetes Program Recommendations:    Note per History "He is no longer on insulin and just "sometimes" takes a pill.  This morning, his wife went to get him a "sugar pill" and then when that didn't help she went to get him another.   While she was gone, he got out of the bed and fell".  Unclear if patients bloods sugar was high or low.  Please order A1C to determine past 2-3 month glycemic control.   Thanks,  Adah Perl, RN, BC-ADM Inpatient Diabetes Coordinator Pager [phone]  (8a-5p)

## 2021-10-14 ENCOUNTER — Encounter (HOSPITAL_COMMUNITY): Payer: Self-pay | Admitting: Orthopedic Surgery

## 2021-10-14 DIAGNOSIS — Z794 Long term (current) use of insulin: Secondary | ICD-10-CM

## 2021-10-14 DIAGNOSIS — I129 Hypertensive chronic kidney disease with stage 1 through stage 4 chronic kidney disease, or unspecified chronic kidney disease: Secondary | ICD-10-CM | POA: Diagnosis not present

## 2021-10-14 DIAGNOSIS — D649 Anemia, unspecified: Secondary | ICD-10-CM

## 2021-10-14 DIAGNOSIS — R52 Pain, unspecified: Secondary | ICD-10-CM

## 2021-10-14 DIAGNOSIS — S42222A 2-part displaced fracture of surgical neck of left humerus, initial encounter for closed fracture: Secondary | ICD-10-CM | POA: Diagnosis not present

## 2021-10-14 DIAGNOSIS — M1990 Unspecified osteoarthritis, unspecified site: Secondary | ICD-10-CM | POA: Diagnosis not present

## 2021-10-14 DIAGNOSIS — F03B18 Unspecified dementia, moderate, with other behavioral disturbance: Secondary | ICD-10-CM | POA: Diagnosis not present

## 2021-10-14 DIAGNOSIS — I619 Nontraumatic intracerebral hemorrhage, unspecified: Secondary | ICD-10-CM | POA: Diagnosis not present

## 2021-10-14 DIAGNOSIS — S72002A Fracture of unspecified part of neck of left femur, initial encounter for closed fracture: Secondary | ICD-10-CM

## 2021-10-14 DIAGNOSIS — Z711 Person with feared health complaint in whom no diagnosis is made: Secondary | ICD-10-CM

## 2021-10-14 DIAGNOSIS — E119 Type 2 diabetes mellitus without complications: Secondary | ICD-10-CM

## 2021-10-14 DIAGNOSIS — R638 Other symptoms and signs concerning food and fluid intake: Secondary | ICD-10-CM

## 2021-10-14 DIAGNOSIS — Z9889 Other specified postprocedural states: Secondary | ICD-10-CM

## 2021-10-14 DIAGNOSIS — N184 Chronic kidney disease, stage 4 (severe): Secondary | ICD-10-CM | POA: Diagnosis not present

## 2021-10-14 DIAGNOSIS — E7800 Pure hypercholesterolemia, unspecified: Secondary | ICD-10-CM | POA: Diagnosis not present

## 2021-10-14 DIAGNOSIS — T1490XA Injury, unspecified, initial encounter: Secondary | ICD-10-CM | POA: Diagnosis not present

## 2021-10-14 DIAGNOSIS — Z66 Do not resuscitate: Secondary | ICD-10-CM | POA: Diagnosis not present

## 2021-10-14 LAB — CBC
HCT: 20 % — ABNORMAL LOW (ref 39.0–52.0)
HCT: 24.4 % — ABNORMAL LOW (ref 39.0–52.0)
Hemoglobin: 6.7 g/dL — CL (ref 13.0–17.0)
Hemoglobin: 8.1 g/dL — ABNORMAL LOW (ref 13.0–17.0)
MCH: 33.5 pg (ref 26.0–34.0)
MCH: 32.4 pg (ref 26.0–34.0)
MCHC: 33.5 g/dL (ref 30.0–36.0)
MCHC: 33.2 g/dL (ref 30.0–36.0)
MCV: 100 fL (ref 80.0–100.0)
MCV: 97.6 fL (ref 80.0–100.0)
Platelets: 109 10*3/uL — ABNORMAL LOW (ref 150–400)
Platelets: 106 10*3/uL — ABNORMAL LOW (ref 150–400)
RBC: 2 MIL/uL — ABNORMAL LOW (ref 4.22–5.81)
RBC: 2.5 MIL/uL — ABNORMAL LOW (ref 4.22–5.81)
RDW: 15.1 % (ref 11.5–15.5)
RDW: 15.7 % — ABNORMAL HIGH (ref 11.5–15.5)
WBC: 13.7 10*3/uL — ABNORMAL HIGH (ref 4.0–10.5)
WBC: 14.8 10*3/uL — ABNORMAL HIGH (ref 4.0–10.5)
nRBC: 0 % (ref 0.0–0.2)
nRBC: 0 % (ref 0.0–0.2)

## 2021-10-14 LAB — GLUCOSE, CAPILLARY
Glucose-Capillary: 143 mg/dL — ABNORMAL HIGH (ref 70–99)
Glucose-Capillary: 117 mg/dL — ABNORMAL HIGH (ref 70–99)
Glucose-Capillary: 182 mg/dL — ABNORMAL HIGH (ref 70–99)
Glucose-Capillary: 166 mg/dL — ABNORMAL HIGH (ref 70–99)

## 2021-10-14 LAB — BASIC METABOLIC PANEL
Anion gap: 11 (ref 5–15)
BUN: 39 mg/dL — ABNORMAL HIGH (ref 8–23)
CO2: 17 mmol/L — ABNORMAL LOW (ref 22–32)
Calcium: 7.7 mg/dL — ABNORMAL LOW (ref 8.9–10.3)
Chloride: 114 mmol/L — ABNORMAL HIGH (ref 98–111)
Creatinine, Ser: 2.84 mg/dL — ABNORMAL HIGH (ref 0.61–1.24)
GFR, Estimated: 21 mL/min — ABNORMAL LOW (ref >60)
Glucose, Bld: 147 mg/dL — ABNORMAL HIGH (ref 70–99)
Potassium: 4.4 mmol/L (ref 3.5–5.1)
Sodium: 142 mmol/L (ref 135–145)

## 2021-10-14 LAB — HEMOGLOBIN A1C
Hgb A1c MFr Bld: 6.6 % — ABNORMAL HIGH (ref 4.8–5.6)
Mean Plasma Glucose: 142.72 mg/dL

## 2021-10-14 MED ORDER — ENOXAPARIN SODIUM 40 MG/0.4ML IJ SOSY: 40.0000 mg | PREFILLED_SYRINGE | Freq: Every day | INTRAMUSCULAR | Status: DC

## 2021-10-14 MED ORDER — ENOXAPARIN SODIUM 40 MG/0.4ML IJ SOSY
40.0000 mg | PREFILLED_SYRINGE | INTRAMUSCULAR | Status: DC
  Administered 2021-10-14: 40 mg via SUBCUTANEOUS
  Filled 2021-10-14: qty 0.4

## 2021-10-14 MED ORDER — CEFAZOLIN SODIUM-DEXTROSE 1-4 GM/50ML-% IV SOLN
1.0000 g | INTRAVENOUS | Status: AC
  Administered 2021-10-14: 1 g via INTRAVENOUS
  Filled 2021-10-14: qty 50

## 2021-10-14 NOTE — Progress Notes (Signed)
NEUROSURGERY PROGRESS NOTE  Doing well ok, denies any headaches. No acute events overnight. Ct head is stable with multifocal hemorrhages that are small and not causing any midline shift or mass effect. Will sign off for now. Ok to start DVT prophylaxis. Please call with any questions or concerns.  Temp:  [97.6 F (36.4 C)-98.6 F (37 C)] 98.2 F (36.8 C) (02/08 0930) Pulse Rate:  [71-101] 88 (02/08 0930) Resp:  [10-25] 20 (02/08 0930) BP: (115-154)/(48-110) 142/51 (02/08 0930) SpO2:  [81 %-100 %] 100 % (02/08 0930)   Eleonore Chiquito, NP 10/14/2021 9:45 AM

## 2021-10-14 NOTE — NC FL2 (Addendum)
[place] MEDICAID FL2 LEVEL OF CARE SCREENING TOOL     IDENTIFICATION  Patient Name: Samuel [name] Birthdate: 1930/03/17 Sex: male Admission Date (Current Location): 11/02/2021  [facility] and [place] Number:  Herbalist and Address:  [facility], [place] [address] [place], [place] [address]      Provider Number: [id number]  Attending Physician Name and Address:  Md, Trauma, MD  Relative Name and Phone Number:  Adan Baehr, spouse [phone]; Lexine Baton, granddaughter [phone]    Current Level of Care: Hospital Recommended Level of Care: [place] Prior Approval Number:    Date Approved/Denied:   PASRR Number: [id number]  Discharge Plan: SNF    Current Diagnoses: Patient Active Problem List   Diagnosis Date Noted   Anemia 10/14/2021   Left displaced femoral neck fracture ([place]) 10/17/2021   BPH (benign prostatic hyperplasia) 10/11/2021   Multifocal atrial tachycardia ([place]) 10/21/2021   ICH (intracerebral hemorrhage) ([place]) 11/01/2021   Fall at home, initial encounter 10/09/2021   Humerus fracture 10/21/2021   CKD (chronic kidney disease) stage 4, GFR 15-29 ml/min ([place]) 10/31/2021   DNR (do not resuscitate) 10/21/2021   Moderate dementia with behavioral disturbance 04/14/2021   Mild cognitive impairment 08/05/2016   Essential hypertension 08/05/2016   Other hyperlipidemia 08/05/2016   Type 2 diabetes mellitus without complication, with long-term current use of insulin ([place]) 08/05/2016    Orientation RESPIRATION BLADDER Height & Weight     Self  O2 (3L/nasal cannula) External catheter Weight: 66.7 kg Height:  5\' 4"  (162.6 cm)  BEHAVIORAL SYMPTOMS/MOOD NEUROLOGICAL BOWEL NUTRITION STATUS      Incontinent Diet (soft diet)  AMBULATORY STATUS COMMUNICATION OF NEEDS Skin   Extensive Assist Verbally Surgical wounds (Lt hip)                       Personal Care Assistance Level of Assistance  Bathing, Feeding,  Dressing Bathing Assistance: Maximum assistance Feeding assistance: Maximum assistance Dressing Assistance: Maximum assistance     Functional Limitations Info  Speech (dysarthria)     Speech Info: Adequate    SPECIAL CARE FACTORS FREQUENCY  PT (By licensed PT), OT (By licensed OT)     PT Frequency: 5x weekly OT Frequency: 5x weekly            Contractures Contractures Info: Not present    Additional Factors Info  Code Status, Allergies, Psychotropic Code Status Info: DNR Allergies Info: Codeine-itching; Penicillins-itching; Sulfa-not specified Psychotropic Info: Aricept, Namenda, Remeron         Current Medications (10/14/2021):  This is the current hospital active medication list Current Facility-Administered Medications  Medication Dose Route Frequency Provider Last Rate Last Admin   0.9 %  sodium chloride infusion   Intravenous Continuous Willaim Sheng, MD 75 mL/hr at 10/14/21 1400 Infusion Verify at 10/14/21 1400   acetaminophen (TYLENOL) tablet 1,000 mg  1,000 mg Oral Q6H Willaim Sheng, MD   1,000 mg at 11/01/2021 2328   allopurinol (ZYLOPRIM) tablet 100 mg  100 mg Oral QHS Willaim Sheng, MD   100 mg at 10/07/2021 2229   amLODipine (NORVASC) tablet 10 mg  10 mg Oral q morning Willaim Sheng, MD   10 mg at 10/14/21 0945   atorvastatin (LIPITOR) tablet 20 mg  20 mg Oral q morning Willaim Sheng, MD   20 mg at 10/14/21 0947   Chlorhexidine Gluconate Cloth 2 % PADS 6 each  6 each Topical  Daily Willaim Sheng, MD       diphenhydrAMINE (BENADRYL) 12.5 MG/5ML elixir 6.25-12.5 mg  6.25-12.5 mg Oral Q6H PRN Willaim Sheng, MD       divalproex (DEPAKOTE) DR tablet 500 mg  500 mg Oral QPM Willaim Sheng, MD   500 mg at 10/25/2021 1816   docusate sodium (COLACE) capsule 100 mg  100 mg Oral BID Willaim Sheng, MD   100 mg at 10/14/21 0947   donepezil (ARICEPT) tablet 10 mg  10 mg Oral QHS Willaim Sheng, MD   10 mg at  10/30/2021 2234   [START ON 10/15/2021] enoxaparin (LOVENOX) injection 40 mg  40 mg Subcutaneous Daily Jesusita Oka, MD       furosemide (LASIX) tablet 20 mg  20 mg Oral Daily Willaim Sheng, MD   20 mg at 10/14/21 0944   gabapentin (NEURONTIN) capsule 100 mg  100 mg Oral QHS Willaim Sheng, MD   100 mg at 10/25/2021 2231   insulin aspart (novoLOG) injection 0-5 Units  0-5 Units Subcutaneous QHS Willaim Sheng, MD   2 Units at 10/31/2021 2103   insulin aspart (novoLOG) injection 0-9 Units  0-9 Units Subcutaneous TID WC Willaim Sheng, MD   1 Units at 10/14/21 0806   ipratropium (ATROVENT) 0.06 % nasal spray 2 spray  2 spray Each Nare TID PRN Willaim Sheng, MD       levETIRAcetam (KEPPRA) IVPB 500 mg/100 mL premix  500 mg Intravenous Q12H Willaim Sheng, MD   Stopped at 10/14/21 0304   memantine (NAMENDA) tablet 10 mg  10 mg Oral BID Willaim Sheng, MD   10 mg at 10/14/21 0945   metoprolol tartrate (LOPRESSOR) tablet 12.5 mg  12.5 mg Oral BID Willaim Sheng, MD   12.5 mg at 10/14/21 0944   mirtazapine (REMERON) tablet 15 mg  15 mg Oral QHS Willaim Sheng, MD   15 mg at 10/21/2021 2233   morphine (PF) 2 MG/ML injection 1-2 mg  1-2 mg Intravenous Q4H PRN Willaim Sheng, MD   2 mg at 10/14/21 0500   ondansetron (ZOFRAN-ODT) disintegrating tablet 4 mg  4 mg Oral Q6H PRN Willaim Sheng, MD       Or   ondansetron [facility]) injection 4 mg  4 mg Intravenous Q6H PRN Willaim Sheng, MD       oxyCODONE (Oxy IR/ROXICODONE) immediate release tablet 2.5-5 mg  2.5-5 mg Oral Q4H PRN Willaim Sheng, MD   5 mg at 10/14/21 0160   tamsulosin (FLOMAX) capsule 0.4 mg  0.4 mg Oral QPM Willaim Sheng, MD   0.4 mg at 10/07/2021 1759   vitamin B-12 (CYANOCOBALAMIN) tablet 1,000 mcg  1,000 mcg Oral q morning Willaim Sheng, MD   1,000 mcg at 10/14/21 1093     Discharge Medications: Please see discharge summary for a list of discharge  medications.  Relevant Imaging Results:  Relevant Lab Results:   Additional Information SS# [id number]  Reinaldo Raddle, RN, BSN  Trauma/Neuro ICU Case Manager [phone]

## 2021-10-14 NOTE — Progress Notes (Signed)
Consultation Progress Note   Patient: Samuel [name] WER:[id number] DOB: 1930-07-28 DOA: 10/28/2021 DOS: the patient was seen and examined on 10/14/2021 Primary service: Md, Trauma, MD  Brief hospital course: 88 y.o. male with medical history significant of BPH, CKD, DM, dementia (still driving with supervising family), HTN, and HLD presenting with a fall. The morning of admission, the patient was thrashing about in bed and his wife was concerned that he was having a "diabetic seizure."  He has reportedly had one prior.  He is no longer on insulin and just "sometimes" takes a pill.  This morning, his wife went to get him a "sugar pill" and then when that didn't help she went to get him another.   While she was gone, he got out of the bed and fell. At baseline he ambulates without assistance, family has occasional caregiver.  Assessment and Plan: Anemia S/p 1 unit PRBC -await h/h recheck  Multifocal atrial tachycardia (HCC) -telemetry irregular, A fib vs MAT? Obtain 12 lead, continue to monitor. Low dose Metoprolol. Unable to anticoagulate due to [name]  BPH (benign prostatic hyperplasia) -continue flomax  Left displaced femoral neck fracture ([place]) -management per trauma team  DNR (do not resuscitate)- (present on admission) -confirmed with son by admitting -palliative care following  CKD (chronic kidney disease) stage 4, GFR 15-29 ml/min ([name])- (present on admission) -most recent Cr 2.77 in 2020.  -improved to 2.84 -avoid nephrotoxic agents  Humerus fracture- (present on admission) -Ortho has consulted, sling recommended, non-surgical  Fall at home, initial encounter -on trauma service due to multiple fractures, ICH  ICH (intracerebral hemorrhage) ([name])- (present on admission) -per neurosurgery, on Keppra, Depakote  Moderate dementia with behavioral disturbance- (present on admission) -Patient with moderate to advanced dementia, often does not recognize his son. MMSE 15/30 during  04/2021 neurology appointment. Continue home meds for behavorial issues - remeron, Namenda, Aricept, Depakote  Type 2 diabetes mellitus without complication, with long-term current use of insulin ([name]) -Patient was taken off insulin, currently on Amaryl and Januvia at home. Hold oral meds, cover with sensitive-scale SSI    Other hyperlipidemia- (present on admission) -Continue Lipitor  Essential hypertension- (present on admission) -Continue Norvasc, Lasix. Monitor renal function       TRH will continue to follow the patient.  Subjective: working with PT  Physical Exam: Vitals:   10/14/21 0930 10/14/21 1055 10/14/21 1100 10/14/21 1130  BP: (!) 142/51 (!) 121/58 (!) 121/58   Pulse: 88 71 74   Resp: 20 15 18    Temp: 98.2 F (36.8 C) 98.4 F (36.9 C)  97.9 F (36.6 C)  TempSrc: Axillary Axillary  Axillary  SpO2: 100% 97% 96%   Weight:      Height:        General: Appearance:     Overweight male in no acute distress     Lungs:     respirations unlabored  Heart:    Normal heart rate.       Neurologic:   Awake, alert, pleasantly confused     Data Reviewed: Hgb 6.7  Family Communication: none at bedside   Time spent: 35 minutes.  Author: Geradine Girt, DO 10/14/2021 12:16 PM  For on call review [email or web address].

## 2021-10-14 NOTE — Progress Notes (Signed)
Trauma/Critical Care Follow Up Note  Subjective:    Overnight Issues:   Objective:  Vital signs for last 24 hours: Temp:  [97.6 F (36.4 C)-98.6 F (37 C)] 98.2 F (36.8 C) (02/08 0930) Pulse Rate:  [71-101] 88 (02/08 0930) Resp:  [10-25] 20 (02/08 0930) BP: (115-154)/(48-110) 142/51 (02/08 0930) SpO2:  [81 %-100 %] 100 % (02/08 0930)  Hemodynamic parameters for last 24 hours:    Intake/Output from previous day: 02/07 0701 - 02/08 0700 In: 1597 [I.V.:897; IV Piggyback:700] Out: 650 [Urine:500; Blood:150]  Intake/Output this shift: Total I/O In: 150.1 [I.V.:150.1] Out: -   Vent settings for last 24 hours:    Physical Exam:  Gen: comfortable, no distress Neuro: non-focal exam HEENT: PERRL Neck: supple CV: RRR Pulm: unlabored breathing Abd: soft, NT GU: clear yellow urine Extr: wwp, no edema   Results for orders placed or performed during the hospital encounter of 10/15/2021 (from the past 24 hour(s))  Glucose, capillary     Status: Abnormal   Collection Time: 10/14/2021 11:26 AM  Result Value Ref Range   Glucose-Capillary 142 (H) 70 - 99 mg/dL  ABO/Rh     Status: None   Collection Time: 10/27/2021  4:10 PM  Result Value Ref Range   ABO/RH(D)      A POS Performed at [facility], [place] [address] [place], [place] [address]   Type and screen [place]     Status: None (Preliminary result)   Collection Time: 10/24/2021  4:51 PM  Result Value Ref Range   ABO/RH(D) A POS    Antibody Screen NEG    Sample Expiration Oct 23, 2021,2359    Unit Number [id number]    Blood Component Type RED CELLS,LR    Unit division 00    Status of Unit ISSUED    Transfusion Status OK TO TRANSFUSE    Crossmatch Result      Compatible Performed at [facility], [place] [address], [place], [place] [address]    Unit Number [id number]    Blood Component Type RED CELLS,LR    Unit division 00    Status of Unit ALLOCATED    Transfusion Status OK TO  TRANSFUSE    Crossmatch Result Compatible   Prepare RBC (crossmatch)     Status: None   Collection Time: 10/09/2021  5:27 PM  Result Value Ref Range   Order Confirmation      ORDER PROCESSED BY BLOOD BANK Performed at [facility], [place] [address] [place], [place] [address]   Glucose, capillary     Status: Abnormal   Collection Time: 10/18/2021  6:48 PM  Result Value Ref Range   Glucose-Capillary 191 (H) 70 - 99 mg/dL  Glucose, capillary     Status: Abnormal   Collection Time: 10/26/2021  8:30 PM  Result Value Ref Range   Glucose-Capillary 206 (H) 70 - 99 mg/dL  CBC     Status: Abnormal   Collection Time: 10/14/21  7:11 AM  Result Value Ref Range   WBC 13.7 (H) 4.0 - 10.5 K/uL   RBC 2.00 (L) 4.22 - 5.81 MIL/uL   Hemoglobin 6.7 (LL) 13.0 - 17.0 g/dL   HCT 20.0 (L) 39.0 - 52.0 %   MCV 100.0 80.0 - 100.0 fL   MCH 33.5 26.0 - 34.0 pg   MCHC 33.5 30.0 - 36.0 g/dL   RDW 15.1 11.5 - 15.5 %   Platelets 109 (L) 150 - 400 K/uL  nRBC 0.0 0.0 - 0.2 %  Basic metabolic panel     Status: Abnormal   Collection Time: 10/14/21  7:11 AM  Result Value Ref Range   Sodium 142 135 - 145 mmol/L   Potassium 4.4 3.5 - 5.1 mmol/L   Chloride 114 (H) 98 - 111 mmol/L   CO2 17 (L) 22 - 32 mmol/L   Glucose, Bld 147 (H) 70 - 99 mg/dL   BUN 39 (H) 8 - 23 mg/dL   Creatinine, Ser 2.84 (H) 0.61 - 1.24 mg/dL   Calcium 7.7 (L) 8.9 - 10.3 mg/dL   GFR, Estimated 21 (L) >60 mL/min   Anion gap 11 5 - 15  Hemoglobin A1c     Status: Abnormal   Collection Time: 10/14/21  7:11 AM  Result Value Ref Range   Hgb A1c MFr Bld 6.6 (H) 4.8 - 5.6 %   Mean Plasma Glucose 142.72 mg/dL  Glucose, capillary     Status: Abnormal   Collection Time: 10/14/21  7:42 AM  Result Value Ref Range   Glucose-Capillary 143 (H) 70 - 99 mg/dL    Assessment & Plan: The plan of care was discussed with the bedside nurse for the day, who is in agreement with this plan and no additional concerns were raised.   Present on Admission:   ICH (intracerebral hemorrhage) ([place])  Other hyperlipidemia  Moderate dementia with behavioral disturbance  Essential hypertension  Humerus fracture  CKD (chronic kidney disease) stage 4, GFR 15-29 ml/min (HCC)  DNR (do not resuscitate)    LOS: 2 days   Additional comments:I reviewed the patient's new clinical lab test results.   and I reviewed the patients new imaging test results.    Fall  Multifocal ICH - NSGY c/s, Dr. Ronnald Ramp. No acute intervention warranted.  No follow-up head CT unless exam declines. Left humeral fracture -Per Dr. Doreatha Martin, nonop in a sling. Left femoral neck fracture - Ortho c/s, s/p  L hemiarthroplasty by Dr. Zachery Dakins 2/7 Left facial ecchymosis Type 2 diabetes mellitus -appreciate TRH CKD stage 4 - appreciate TRH HTN - appreciate TRH Multifocal atrial tachycardia - appreciate TRH BPH - flomax Baseline dementia -resume home meds CKD -follow HTN -monitor ABLA - post-op, txf 1u pRBC today FEN - soft diet VTE - SCDs, LMWH okay with NSGY, reduced dose due to renal fxn Dispo - 4NP, Palliative Care to meet with family 2/9 at 1500.   Samuel Oka, MD Trauma & General Surgery Please use [email or web address] to contact on call provider  10/14/2021  *Care during the described time interval was provided by me. I have reviewed this patient's available data, including medical history, events of note, physical examination and test results as part of my evaluation.

## 2021-10-14 NOTE — Progress Notes (Signed)
Date and time results received: 10/14/21 0837 (use smartphrase ".now" to insert current time)  Test: Hgb Critical Value: 6.7  Name of Provider Notified: Dr Doylene Canning  Orders Received? Or Actions Taken?: Actions Taken: 1 unit PRBC

## 2021-10-14 NOTE — Evaluation (Signed)
Speech Language Pathology Evaluation Patient Details Name: Samuel [name] MRN: [id number] DOB: 1930/04/24 Today's Date: 10/14/2021 Time: 1001-1011 SLP Time Calculation (min) (ACUTE ONLY): 10 min  Problem List:  Patient Active Problem List   Diagnosis Date Noted   Left displaced femoral neck fracture ([place]) 10/17/2021   BPH (benign prostatic hyperplasia) 10/10/2021   Multifocal atrial tachycardia ([place]) 10/22/2021   ICH (intracerebral hemorrhage) ([place]) 10/30/2021   Fall at home, initial encounter 10/28/2021   Humerus fracture 11/01/2021   CKD (chronic kidney disease) stage 4, GFR 15-29 ml/min ([place]) 10/23/2021   DNR (do not resuscitate) 10/27/2021   Moderate dementia with behavioral disturbance 04/14/2021   Mild cognitive impairment 08/05/2016   Essential hypertension 08/05/2016   Other hyperlipidemia 08/05/2016   Type 2 diabetes mellitus without complication, with long-term current use of insulin ([place]) 08/05/2016   Past Medical History:  Past Medical History:  Diagnosis Date   BPH (benign prostatic hyperplasia)    Bruise    LEFT BACK/SHOULDER AREA   Chronic kidney disease (CKD), stage III (moderate) (HCC) BASE CRE  1.89  -- 2.2   NEPHROLOGIST-- [name]--  LOV DEC 2013  W/ CHART   Diabetes mellitus    PCP--  DR TISOVEC   History of kidney stones    Hypercholesteremia    Hyperparathyroidism, secondary ([place])    Hypertension    Iron deficiency anemia    Left shoulder strain    PT FELL ON ICE 11-06-2012   Past Surgical History:  Past Surgical History:  Procedure Laterality Date   BLEPHAROPLASTY Bilateral 04-02-2005   CYSTO/ LEFT URETERAL STENT PLACEMENT  02-07-2003   CYSTO/ RIGHT URETER BALLOON DILATION/ STONE EXTRACTION  02-20-2003   GREEN LIGHT LASER TURP (TRANSURETHRAL RESECTION OF PROSTATE N/A 11/24/2012   Procedure: GREEN LIGHT LASER TURP (TRANSURETHRAL RESECTION OF PROSTATE;  Surgeon: [name], MD;  Location: [facility];  Service:  Urology;  Laterality: N/A;   LEFT URETEROSCOPIC STONE EXTRACTION  02-14-2003   HPI:  Pt is 88 yo male who presents after syncopal vs seizure episode at home with a fall.Pt with L humeral neck fx (non-op mgmt with sling) and L displaced femoral neck fx now s/p HEMIARTHROPLASTY 11/03/2021.  CT showed multiple small ICH. PMH: dementia, DM, HTN, IDA, CKD, BPH   Assessment / Plan / Recommendation Clinical Impression  Speech-language-cognitive assessment was very limited due to pt's lethargy and severity of dysarthria and no family present. RN reported he has a history of dementia (suspect mild but unknown severity). He needed frequent verbal and tactile cues to remain awake and able to respond. He is oriented to self and birthday only. Stated his wife's name and "I don't know" for son's name. He followed simple commands for oral -motor and generalized labial and lingual weakness/ROM impairments. Suspect speech will continue to be dysarthric but improve once he is more awake. He is being followed by Palliative care and should have the amount of assist necessary at next venue of care. SLP is not recommneding ST follow up at this time.    SLP Assessment  SLP Recommendation/Assessment: Patient does not need any further Speech Lanaguage Pathology Services SLP Visit Diagnosis: Cognitive communication deficit (R41.841);Dysarthria and anarthria (R47.1)    Recommendations for follow up therapy are one component of a multi-disciplinary discharge planning process, led by the attending physician.  Recommendations may be updated based on patient status, additional functional criteria and insurance authorization.    Follow Up Recommendations  No SLP follow up  Assistance Recommended at Discharge     Functional Status Assessment    Frequency and Duration           SLP Evaluation Cognition  Overall Cognitive Status: No family/caregiver present to determine baseline cognitive functioning (history of  dementia) Arousal/Alertness: Lethargic Orientation Level: Oriented to person;Disoriented to situation;Disoriented to time Year: Other (Comment) (1944) Attention: Sustained Sustained Attention: Impaired Sustained Attention Impairment: Verbal basic Memory:  (unable to assess due to lethargy) Awareness: Impaired Awareness Impairment: Emergent impairment;Anticipatory impairment Problem Solving:  (will assess further) Safety/Judgment: Impaired       Comprehension  Auditory Comprehension Overall Auditory Comprehension: Appears within functional limits for tasks assessed Commands:  (followed one step for oral motor) Visual Recognition/Discrimination Discrimination: Not tested Reading Comprehension Reading Status: Not tested    Expression Expression Primary Mode of Expression: Verbal Verbal Expression Overall Verbal Expression:  (suspect functional, severe dysarthria) Initiation: No impairment Level of Generative/Spontaneous Verbalization: Phrase Naming: Not tested Pragmatics: Impairment Impairments: Eye contact Written Expression Dominant Hand: Right Written Expression: Not tested   Oral / Motor  Oral Motor/Sensory Function Overall Oral Motor/Sensory Function: Generalized oral weakness Motor Speech Overall Motor Speech: Impaired Respiration: Impaired Level of Impairment: Phrase Phonation: Low vocal intensity Resonance: Within functional limits Articulation: Impaired Level of Impairment: Word Intelligibility: Intelligibility reduced Word: 0-24% accurate Phrase: 0-24% accurate Motor Planning: Witnin functional limits            Houston Siren 10/14/2021, 10:30 AM  Orbie Pyo Colvin Caroli.Ed Risk analyst [phone] Office [phone]

## 2021-10-14 NOTE — Progress Notes (Signed)
Physical Therapy Treatment Patient Details Name: Samuel [name] MRN: [id number] DOB: September 14, 1929 Today's Date: 10/14/2021   History of Present Illness Pt is 88 yo male who presents after syncopal vs seizure episode at home with a fall.Pt with L humeral neck fx (non-op mgmt with sling) and L displaced femoral neck fx now s/p HEMIARTHROPLASTY 10/15/2021.  CT showed multiple small ICH. PMH: dementia, DM, HTN, IDA, CKD, BPH    PT Comments    Pt limited by pain and confusion. Pt requires re-orientation to situation multiple times, often forgetting his injuries during session. Pt requires significant assistance to mobilize in bed and is unable to stand at this time. Pt will benefit from continued attempts at mobilizing in an effort to reduce falls risk and caregiver burden.   Recommendations for follow up therapy are one component of a multi-disciplinary discharge planning process, led by the attending physician.  Recommendations may be updated based on patient status, additional functional criteria and insurance authorization.  Follow Up Recommendations  Skilled nursing-short term rehab (<3 hours/day)     Assistance Recommended at Discharge Frequent or constant Supervision/Assistance  Patient can return home with the following Two people to help with bathing/dressing/bathroom;Assistance with feeding;Assistance with cooking/housework;Direct supervision/assist for medications management;Direct supervision/assist for financial management;Assist for transportation;Help with stairs or ramp for entrance;Two people to help with walking and/or transfers   Equipment Recommendations  Wheelchair (measurements PT);Hospital bed;Other (comment) (hoyer lift)    Recommendations for Other Services       Precautions / Restrictions Precautions Precautions: Fall Required Braces or Orthoses: Sling Restrictions Weight Bearing Restrictions: Yes LUE Weight Bearing: Non weight bearing LLE Weight Bearing: Weight  bearing as tolerated     Mobility  Bed Mobility Overal bed mobility: Needs Assistance Bed Mobility: Rolling, Supine to Sit, Sit to Supine Rolling: Total assist   Supine to sit: Max assist, +2 for physical assistance, HOB elevated Sit to supine: Total assist, +2 for physical assistance        Transfers Overall transfer level: Needs assistance Equipment used: 2 person hand held assist Transfers: Sit to/from Stand Sit to Stand: Total assist (unable to clear hips from bed)                Ambulation/Gait                   Stairs             Wheelchair Mobility    Modified Rankin (Stroke Patients Only)       Balance Overall balance assessment: Needs assistance Sitting-balance support: No upper extremity supported, Feet supported Sitting balance-Leahy Scale: Poor Sitting balance - Comments: min-modA, posterior lean Postural control: Posterior lean                                  Cognition Arousal/Alertness: Awake/alert Behavior During Therapy: Restless Overall Cognitive Status: History of cognitive impairments - at baseline                                 General Comments: oriented to self and place, not to situation or time. Pt requires verbal and tactile cues, inconsistently following one-step commands. Pt with history of dementia        Exercises      General Comments General comments (skin integrity, edema, etc.): HR into low 100s with mobility, afib noted on  monitor. Pt hypertensive up to 179/90 with sitting, BP back to 145/55 in supine when resting. Pt removes 2L [place] during session, sats stable in low 90s on room air      Pertinent Vitals/Pain Pain Assessment Pain Assessment: Faces Faces Pain Scale: Hurts whole lot Pain Location: L hip, L arm, back Pain Descriptors / Indicators: Aching Pain Intervention(s): Monitored during session    Home Living                          Prior Function             PT Goals (current goals can now be found in the care plan section) Acute Rehab PT Goals Patient Stated Goal: none stated Progress towards PT goals: Progressing toward goals (very slowly)    Frequency    Min 3X/week      PT Plan Current plan remains appropriate    Co-evaluation PT/OT/SLP Co-Evaluation/Treatment: Yes Reason for Co-Treatment: Complexity of the patient's impairments (multi-system involvement);Necessary to address cognition/behavior during functional activity;For patient/therapist safety;To address functional/ADL transfers PT goals addressed during session: Mobility/safety with mobility;Balance;Strengthening/ROM        AM-PAC PT "6 Clicks" Mobility   Outcome Measure  Help needed turning from your back to your side while in a flat bed without using bedrails?: Total Help needed moving from lying on your back to sitting on the side of a flat bed without using bedrails?: Total Help needed moving to and from a bed to a chair (including a wheelchair)?: Total Help needed standing up from a chair using your arms (e.g., wheelchair or bedside chair)?: Total Help needed to walk in hospital room?: Total Help needed climbing 3-5 steps with a railing? : Total 6 Click Score: 6    End of Session   Activity Tolerance: Patient limited by pain Patient left: in bed;with call bell/phone within reach;with bed alarm set;with restraints reapplied;with nursing/sitter in room Nurse Communication: Mobility status PT Visit Diagnosis: Muscle weakness (generalized) (M62.81);Difficulty in walking, not elsewhere classified (R26.2);Pain Pain - Right/Left: Left Pain - part of body: Shoulder;Hip     Time: 1517-6160 PT Time Calculation (min) (ACUTE ONLY): 29 min  Charges:  $Therapeutic Activity: 8-22 mins                    Zenaida Niece, PT, DPT Acute Rehabilitation Pager: [phone] Office [phone]    Zenaida Niece 10/14/2021, 9:23 AM

## 2021-10-14 NOTE — Progress Notes (Signed)
Daily Progress Note   Patient Name: Samuel [name]       Date: 10/14/2021 DOB: [birth year]  Age: 88 y.o. MRN#: [id number] Attending Physician: Particia Jasper, MD Primary Care Physician: Haywood Pao, MD Admit Date: 10/28/2021  Reason for Consultation/Follow-up: Establishing goals of care  Subjective: Chart review performed. Received report from primary RN - no acute concerns. RN reports patient is refusing medications, does not like to be moved, is not eating/drinking, received 1 unit pRBCs today, and may require foley catheter for urinary retention.   Went to visit patient at bedside - no family/visitors present. Patient was lying in bed asleep - he does wake to voice/gentle touch. He remains confused - only oriented to self. He endorses pain "everywhere." Signs/non-verbal gestures of pain and discomfort noted. No respiratory distress, increased work of breathing, or secretions noted.   Notified RN to provided dose of pain medication.  Called granddaughter/Nikki per her request yesterday to check in. Emotional support was provided. Reviewed updates from my and RN assessment as outlined above. Gently voiced thoughts that patient may be approaching end of life - Nikki expressed understanding. Briefly discussed options for rehab vs home with hospice. Reviewed natural trajectory at EOL. PMT to discuss options further during scheduled family meeting tomorrow.  All questions and concerns addressed. Encouraged to call with questions and/or concerns. PMT number previously provided.   Length of Stay: 2  Current Medications: Scheduled Meds:   acetaminophen  1,000 mg Oral Q6H   allopurinol  100 mg Oral QHS   amLODipine  10 mg Oral q morning   atorvastatin  20 mg Oral q morning   Chlorhexidine  Gluconate Cloth  6 each Topical Daily   divalproex  500 mg Oral QPM   docusate sodium  100 mg Oral BID   donepezil  10 mg Oral QHS   [START ON 10/15/2021] enoxaparin  40 mg Subcutaneous Daily   furosemide  20 mg Oral Daily   gabapentin  100 mg Oral QHS   insulin aspart  0-5 Units Subcutaneous QHS   insulin aspart  0-9 Units Subcutaneous TID WC   memantine  10 mg Oral BID   metoprolol tartrate  12.5 mg Oral BID   mirtazapine  15 mg Oral QHS   tamsulosin  0.4 mg Oral QPM   vitamin B-12  1,000 mcg Oral q morning    Continuous Infusions:  sodium chloride 75 mL/hr at 10/14/21 1400   levETIRAcetam Stopped (10/14/21 0304)    PRN Meds: diphenhydrAMINE, ipratropium, morphine injection, ondansetron **OR** ondansetron (ZOFRAN) IV, oxyCODONE  Physical Exam Vitals and nursing note reviewed.  Constitutional:      General: He is not in acute distress.    Appearance: He is ill-appearing.  Pulmonary:     Effort: No respiratory distress.  Skin:    General: Skin is warm and dry.  Neurological:     Mental Status: He is lethargic and confused.     Motor: Weakness present.            Vital Signs: BP (!) 128/51    Pulse 81    Temp 97.9 F (36.6 C) (Axillary)    Resp 18    Ht 5\' 4"  (1.626 m)    Wt 66.7 kg    SpO2 94%    BMI 25.24 kg/m  SpO2: SpO2: 94 % O2 Device: O2 Device: Nasal Cannula O2 Flow Rate: O2 Flow Rate (L/min): 3 L/min  Intake/output summary:  Intake/Output Summary (Last 24 hours) at 10/14/2021 1454 Last data filed at 10/14/2021 1400 Gross per 24 hour  Intake 2439.6 ml  Output 1300 ml  Net 1139.6 ml   LBM: Last BM Date:  (PTA) Baseline Weight: Weight: 66.7 kg Most recent weight: Weight: 66.7 kg       Palliative Assessment/Data: PPS 10%      Patient Active Problem List   Diagnosis Date Noted   Anemia 10/14/2021   Left displaced femoral neck fracture (HCC) 10/26/2021   BPH (benign prostatic hyperplasia) 10/09/2021   Multifocal atrial tachycardia ([place]) 10/07/2021    ICH (intracerebral hemorrhage) ([place]) 10/22/2021   Fall at home, initial encounter 10/23/2021   Humerus fracture 10/14/2021   CKD (chronic kidney disease) stage 4, GFR 15-29 ml/min ([place]) 10/11/2021   DNR (do not resuscitate) 11/03/2021   Moderate dementia with behavioral disturbance 04/14/2021   Mild cognitive impairment 08/05/2016   Essential hypertension 08/05/2016   Other hyperlipidemia 08/05/2016   Type 2 diabetes mellitus without complication, with long-term current use of insulin ([place]) 08/05/2016    Palliative Care Assessment & Plan   Patient Profile: 88 y.o. male  with past medical history of BPH, stage 3b CKD, DM, Alzheimer's dementia, HTN, and HLD presented to ED on 10/11/2021 from home after wife witnessed syncopal event vs seizure with fall. He lost consciousness for several minutes. Neurosurgery was consulted who recommended patient be admitted for seizure work up. Orthopedics were consulted and recommended non-surgical treatment with sling, NWB, and outpatient follow up. Patient was admitted on 10/14/2021 with fall at home, CKD stage 4, humerus fracture, intracranial hemorrhage.   Assessment: Left femoral neck fracture Left proximal humerus fracture Fall at home Multifactorial tachycardia CKD stage 4 ICH Moderate dementia with behavioral disturbance  Recommendations/Plan: Continue current medical treatment Continue DNR/DNI as previously documented - durable DNR form completed and placed in shadow chart. Copy was made and will be scanned into Vyn[facility]/ACP tab [name] and would be appropriate for hospice if family agreeable. They are considering discharge with home hospice vs rehab Family meeting scheduled for tomorrow 2/9 at 3p PMT will continue to follow and support holistically  Goals of Care and Additional Recommendations: Limitations on Scope of Treatment: Full Scope Treatment, No Artificial Feeding, and No Tracheostomy  Code Status:     Code Status Orders  (From admission,  onward)           Start     Ordered   10/19/2021 1549  Do not attempt resuscitation (DNR)  Continuous       Question Answer Comment  In the event of cardiac or respiratory ARREST Do not call a code blue   In the event of cardiac or respiratory ARREST Do not perform Intubation, CPR, defibrillation or ACLS   In the event of cardiac or respiratory ARREST Use medication by any route, position, wound care, and other measures to relive pain and suffering. May use oxygen, suction and manual treatment of airway obstruction as needed for comfort.      10/11/2021 1549           Code Status History     Date Active Date Inactive Code Status Order ID Comments User Context   10/26/2021 1515 10/08/2021 1549 Full Code [id number]  Jesusita Oka, MD ED       Prognosis:  Unable to determine  Discharge Planning: To Be Determined  Care plan was discussed with primary RN, patient's granddaughter  Thank you for allowing the Palliative Medicine Team to assist in the care of this patient.   Total Time 37 minutes Prolonged Time Billed  no       Greater than 50%  of this time was spent counseling and coordinating care related to the above assessment and plan.  Lin Landsman, NP  Please contact Palliative Medicine Team phone at [phone] for questions and concerns.

## 2021-10-14 NOTE — TOC Initial Note (Signed)
Transition of Care [facility]) - Initial/Assessment Note    Patient Details  Name: Samuel [name] MRN: [id number] Date of Birth: Jan 19, 1930  [facility] of [facility]) CM/SW Contact:    Ella Bodo, RN Phone Number: 10/14/2021, 2:00 PM  Clinical Narrative:                 Pt is 88 yo male who presents after syncopal vs seizure episode at home with a fall.Pt with L humeral neck fx (non-op mgmt with sling) and L displaced femoral neck fx now s/p HEMIARTHROPLASTY 10/23/2021.  CT showed multiple small ICH. PMH: dementia, DM, HTN, IDA, CKD, BPH. PTA, pt requires assistance with ADLs; lives at home with spouse of almost 74 years and adult son.  PT/OT recommending SNF for rehab at discharge; Palliative Medicine Team assisting family with determining goals of care.  Left message for patient's granddaughter, Lexine Baton, who works as a Marine scientist at [employer].  I spoke with patient's son, Nolon Bussing, who is listed as one of patient's POAs.  We discussed PT/OT recommendations, and he is agreeable to faxing out patient information for SNF bed search.  Patient/family live in [place], and son states they would prefer something in the [place] area, if possible. Will initiate FL2 and provide bed offers when available.  Son states family prefers that information be communicated through granddaughter Lexine Baton; phone # [phone].   Expected Discharge Plan: [place] Barriers to Discharge: Continued Medical Work up          Expected Discharge Plan and Services Expected Discharge Plan: [place]   Discharge Planning Services: CM Consult Post Acute Care Choice: [place] Living arrangements for the past 2 months: Single Family Home                                      Prior Living Arrangements/Services Living arrangements for the past 2 months: Single Family Home Lives with:: Spouse, Adult Children Patient language and need for interpreter reviewed:: Yes         Need for Family Participation in Patient Care: Yes (Comment) Care giver support system in place?: Yes (comment)   Criminal Activity/Legal Involvement Pertinent to Current Situation/Hospitalization: No - Comment as needed        Orientation: : Oriented to Self      Admission diagnosis:  Trauma [T14.90XA] Brain bleed ([place]) [I61.9] Pain [R52] ICH (intracerebral hemorrhage) ([place]) [I61.9] Fall [W19.XXXA] New onset seizure ([place]) [R56.9] Closed 2-part displaced fracture of surgical neck of left humerus, initial encounter [S42.222A] Patient Active Problem List   Diagnosis Date Noted   Anemia 10/14/2021   Left displaced femoral neck fracture ([place]) 10/29/2021   BPH (benign prostatic hyperplasia) 10/07/2021   Multifocal atrial tachycardia ([place]) 10/09/2021   ICH (intracerebral hemorrhage) ([place]) 10/18/2021   Fall at home, initial encounter 10/30/2021   Humerus fracture 10/15/2021   CKD (chronic kidney disease) stage 4, GFR 15-29 ml/min ([place]) 10/24/2021   DNR (do not resuscitate) 10/24/2021   Moderate dementia with behavioral disturbance 04/14/2021   Mild cognitive impairment 08/05/2016   Essential hypertension 08/05/2016   Other hyperlipidemia 08/05/2016   Type 2 diabetes mellitus without complication, with long-term current use of insulin ([place]) 08/05/2016   PCP:  Haywood Pao, MD Pharmacy:   [id number] - [place], [place] - [address] [place] [address]  Phone: [phone] Fax: [phone]     Social Determinants of Health (SDOH) Interventions    Readmission Risk Interventions No flowsheet data found.  Reinaldo Raddle, RN, BSN  Trauma/Neuro ICU Case Manager [phone]

## 2021-10-14 NOTE — Progress Notes (Signed)
PHARMACY NOTE:  ANTIMICROBIAL RENAL DOSAGE ADJUSTMENT  Current antimicrobial regimen includes a mismatch between antimicrobial dosage and estimated renal function.  As per policy approved by the Pharmacy & Therapeutics and Medical Executive Committees, the antimicrobial dosage will be adjusted accordingly.  Current antimicrobial dosage:  Cefazolin 2gm IV q8h x 2  Indication: Surgical prophylaxis  Renal Function:  Estimated Creatinine Clearance: 13.2 mL/min (A) (by C-G formula based on SCr of 3.18 mg/dL (H)). []      On intermittent HD, scheduled: []      On CRRT    Antimicrobial dosage has been changed to:  Cefazolin 1gm IV q24h x 1  Additional comments:   Lura Falor A. Levada Dy, PharmD, BCPS, FNKF Clinical Pharmacist [place] Please utilize Amion for appropriate phone number to reach the unit pharmacist ([place])  10/14/2021 7:38 AM

## 2021-10-14 NOTE — Progress Notes (Signed)
Have attempted to give patient his evening meds including tylenol, flomax & depakote but patient refuses.  His granddaughter, who is a Marine scientist on [employer], was also attempting to coax him to take his PO meds but he repeatedly spits them out. I have explained to pt the importance of the medications but he is very irritated and I am not sure he is comprehending the importance at this time. Mykelti Goldenstein C 7:28 PM

## 2021-10-14 NOTE — Progress Notes (Signed)
Occupational Therapy Treatment Patient Details Name: Samuel [name] MRN: [id number] DOB: Dec 31, 1929 Today's Date: 10/14/2021   History of present illness Pt is 88 yo male who presents after syncopal vs seizure episode at home with a fall.Pt with L humeral neck fx (non-op mgmt with sling) and L displaced femoral neck fx now s/p HEMIARTHROPLASTY 10/11/2021.  CT showed multiple small ICH. PMH: dementia, DM, HTN, IDA, CKD, BPH   OT comments  Pt making progress towards OT goals with attempted sit<>stand and improvement sitting EOB for ADL and grooming tasks. Pt still in significant pain throughout session and requiring significant assist to come EOB. Max A to adjust sling to proper position, min A for grooming tasks while in supported sitting. Pt stating "lay me down now, don't hurt me" Pt knew who he was and that he was in the hospital but could not remember why his hip and arm hurt. POC remains appropiate at this time, acknowledge and appreciate Palliative team care and following to meet goals of patient/family/medical team. OT will continue to follow acutely.    Recommendations for follow up therapy are one component of a multi-disciplinary discharge planning process, led by the attending physician.  Recommendations may be updated based on patient status, additional functional criteria and insurance authorization.    Follow Up Recommendations  Skilled nursing-short term rehab (<3 hours/day) (following notes from Palliative care team, and will support whatever they and family decide - potetially home with hospice)    Assistance Recommended at Discharge Frequent or constant Supervision/Assistance  Patient can return home with the following  Two people to help with walking and/or transfers;A lot of help with bathing/dressing/bathroom;Assistance with feeding;Assistance with cooking/housework;Assist for transportation;Help with stairs or ramp for entrance;Other (comment) (lift equipment)   [facility] bed;Wheelchair cushion (measurements OT);Wheelchair (measurements OT);BSC/3in1;Other (comment) (hoyer lift)    Recommendations for Other Services PT consult    Precautions / Restrictions Precautions Precautions: Fall Required Braces or Orthoses: Sling Restrictions Weight Bearing Restrictions: Yes LUE Weight Bearing: Non weight bearing LLE Weight Bearing: Weight bearing as tolerated       Mobility Bed Mobility Overal bed mobility: Needs Assistance Bed Mobility: Rolling, Supine to Sit, Sit to Supine Rolling: Total assist   Supine to sit: Max assist, +2 for physical assistance, HOB elevated Sit to supine: Total assist, +2 for physical assistance   General bed mobility comments: pt painful with all mvmt, needed full support pivoting to EOB and returning to supine    Transfers Overall transfer level: Needs assistance Equipment used: 2 person hand held assist Transfers: Sit to/from Stand Sit to Stand: Total assist (unable to clear hips from bed)                 Balance Overall balance assessment: Needs assistance Sitting-balance support: No upper extremity supported, Feet supported Sitting balance-Leahy Scale: Poor Sitting balance - Comments: min-modA, posterior lean Postural control: Posterior lean                                 ADL either performed or assessed with clinical judgement   ADL Overall ADL's : Needs assistance/impaired     Grooming: Wash/dry face;Minimal assistance;Sitting Grooming Details (indicate cue type and reason): requires assist for all Bil hand activities             Lower Body Dressing: Total assistance Lower Body Dressing Details (indicate cue type and reason): to don socks  bedlevel Toilet Transfer: Total assistance   Toileting- Clothing Manipulation and Hygiene: Total assistance              Extremity/Trunk Assessment              Vision       Perception     Praxis       Cognition Arousal/Alertness: Awake/alert Behavior During Therapy: Restless Overall Cognitive Status: History of cognitive impairments - at baseline                                 General Comments: oriented to self and place, not to situation or time. Pt requires verbal and tactile cues, inconsistently following one-step commands. Pt with history of dementia        Exercises      Shoulder Instructions       General Comments HR into low 100s with mobility, afib noted on monitor. Pt hypertensive up to 179/90 with sitting, BP back to 145/55 in supine when resting. Pt removes 2L [place] during session, sats stable in low 90s on room air    Pertinent Vitals/ Pain       Pain Assessment Pain Assessment: Faces Faces Pain Scale: Hurts whole lot Pain Location: L hip, L arm, back Pain Descriptors / Indicators: Aching Pain Intervention(s): Monitored during session, Repositioned, Ice applied (encouraged Pt to take pills)  Home Living                                          Prior Functioning/Environment              Frequency  Min 2X/week        Progress Toward Goals  OT Goals(current goals can now be found in the care plan section)  Progress towards OT goals: Progressing toward goals  Acute Rehab OT Goals Patient Stated Goal: decrease pain OT Goal Formulation: With patient Time For Goal Achievement: 10/27/21 Potential to Achieve Goals: [place] Discharge plan remains appropriate    Co-evaluation    PT/OT/SLP Co-Evaluation/Treatment: Yes Reason for Co-Treatment: Complexity of the patient's impairments (multi-system involvement);Necessary to address cognition/behavior during functional activity;For patient/therapist safety;To address functional/ADL transfers PT goals addressed during session: Mobility/safety with mobility;Balance;Strengthening/ROM OT goals addressed during session: ADL's and self-care;Strengthening/ROM      AM-PAC OT  "6 Clicks" Daily Activity     Outcome Measure   Help from another person eating meals?: A Little Help from another person taking care of personal grooming?: A Little Help from another person toileting, which includes using toliet, bedpan, or urinal?: Total Help from another person bathing (including washing, rinsing, drying)?: A Lot Help from another person to put on and taking off regular upper body clothing?: A Lot Help from another person to put on and taking off regular lower body clothing?: Total 6 Click Score: 12    End of Session Equipment Utilized During Treatment: Gait belt;Other (comment) (sling)  OT Visit Diagnosis: Unsteadiness on feet (R26.81);Other abnormalities of gait and mobility (R26.89);History of falling (Z91.81);Muscle weakness (generalized) (M62.81);Feeding difficulties (R63.3);Other symptoms and signs involving the nervous system (R29.898);Other symptoms and signs involving cognitive function;Pain Pain - Right/Left: Left Pain - part of body: Hip;Shoulder   Activity Tolerance Patient limited by pain   Patient Left in bed;with call bell/phone within reach;with bed alarm set;with SCD's reapplied  Nurse Communication Mobility status;Precautions;Weight bearing status;Need for lift equipment        Time: [phone] OT Time Calculation (min): 30 min  Charges: OT General Charges $OT Visit: 1 Visit OT Treatments $Therapeutic Activity: 8-22 mins  Jesse Sans OTR/L Acute Rehabilitation Services Pager: [phone] Office: [place] 10/14/2021, 10:06 AM

## 2021-10-14 NOTE — Assessment & Plan Note (Addendum)
S/p 1 unit PRBC -repeat h/h improved

## 2021-10-15 ENCOUNTER — Ambulatory Visit: Payer: Medicare Other | Admitting: Physician Assistant

## 2021-10-15 DIAGNOSIS — T1490XA Injury, unspecified, initial encounter: Secondary | ICD-10-CM | POA: Diagnosis not present

## 2021-10-15 DIAGNOSIS — S42222A 2-part displaced fracture of surgical neck of left humerus, initial encounter for closed fracture: Secondary | ICD-10-CM | POA: Diagnosis not present

## 2021-10-15 DIAGNOSIS — E119 Type 2 diabetes mellitus without complications: Secondary | ICD-10-CM | POA: Diagnosis not present

## 2021-10-15 DIAGNOSIS — I619 Nontraumatic intracerebral hemorrhage, unspecified: Secondary | ICD-10-CM | POA: Diagnosis not present

## 2021-10-15 DIAGNOSIS — Z66 Do not resuscitate: Secondary | ICD-10-CM | POA: Diagnosis not present

## 2021-10-15 DIAGNOSIS — N184 Chronic kidney disease, stage 4 (severe): Secondary | ICD-10-CM | POA: Diagnosis not present

## 2021-10-15 LAB — CBC
HCT: 24.2 % — ABNORMAL LOW (ref 39.0–52.0)
Hemoglobin: 7.8 g/dL — ABNORMAL LOW (ref 13.0–17.0)
MCH: 31.8 pg (ref 26.0–34.0)
MCHC: 32.2 g/dL (ref 30.0–36.0)
MCV: 98.8 fL (ref 80.0–100.0)
Platelets: 94 10*3/uL — ABNORMAL LOW (ref 150–400)
RBC: 2.45 MIL/uL — ABNORMAL LOW (ref 4.22–5.81)
RDW: 16.3 % — ABNORMAL HIGH (ref 11.5–15.5)
WBC: 13 10*3/uL — ABNORMAL HIGH (ref 4.0–10.5)
nRBC: 0.2 % (ref 0.0–0.2)

## 2021-10-15 LAB — BASIC METABOLIC PANEL
Anion gap: 17 — ABNORMAL HIGH (ref 5–15)
BUN: 41 mg/dL — ABNORMAL HIGH (ref 8–23)
CO2: 14 mmol/L — ABNORMAL LOW (ref 22–32)
Calcium: 7.8 mg/dL — ABNORMAL LOW (ref 8.9–10.3)
Chloride: 114 mmol/L — ABNORMAL HIGH (ref 98–111)
Creatinine, Ser: 2.9 mg/dL — ABNORMAL HIGH (ref 0.61–1.24)
GFR, Estimated: 20 mL/min — ABNORMAL LOW (ref >60)
Glucose, Bld: 171 mg/dL — ABNORMAL HIGH (ref 70–99)
Potassium: 4.8 mmol/L (ref 3.5–5.1)
Sodium: 145 mmol/L (ref 135–145)

## 2021-10-15 LAB — GLUCOSE, CAPILLARY
Glucose-Capillary: 150 mg/dL — ABNORMAL HIGH (ref 70–99)
Glucose-Capillary: 152 mg/dL — ABNORMAL HIGH (ref 70–99)

## 2021-10-15 MED ORDER — ONDANSETRON HCL 4 MG/2ML IJ SOLN: 4.0000 mg | Freq: Four times a day (QID) | INTRAMUSCULAR | Status: DC | PRN

## 2021-10-15 MED ORDER — BIOTENE DRY MOUTH MT LIQD
15.0000 mL | Freq: Two times a day (BID) | OROMUCOSAL | Status: DC
  Administered 2021-10-15: 15 mL via TOPICAL

## 2021-10-15 MED ORDER — SODIUM CHLORIDE 0.9 % IV SOLN: INTRAVENOUS | Status: DC

## 2021-10-15 MED ORDER — LEVETIRACETAM 500 MG PO TABS: 500.0000 mg | ORAL_TABLET | Freq: Two times a day (BID) | ORAL | Status: DC

## 2021-10-15 MED ORDER — HYDROMORPHONE HCL 1 MG/ML PO LIQD
2.5000 mg | ORAL | Status: DC | PRN
  Administered 2021-10-15: 2.5 mg via ORAL
  Administered 2021-10-15: 3 mg via ORAL
  Filled 2021-10-15: qty 1
  Filled 2021-10-15: qty 3

## 2021-10-15 MED ORDER — HALOPERIDOL 1 MG PO TABS
2.0000 mg | ORAL_TABLET | Freq: Four times a day (QID) | ORAL | Status: DC | PRN
  Filled 2021-10-15: qty 2

## 2021-10-15 MED ORDER — GLYCOPYRROLATE 0.2 MG/ML IJ SOLN: 0.2000 mg | INTRAMUSCULAR | Status: DC | PRN

## 2021-10-15 MED ORDER — HALOPERIDOL LACTATE 2 MG/ML PO CONC
2.0000 mg | Freq: Four times a day (QID) | ORAL | Status: DC | PRN
  Filled 2021-10-15: qty 1

## 2021-10-15 MED ORDER — LORAZEPAM 2 MG/ML IJ SOLN
1.0000 mg | Freq: Once | INTRAMUSCULAR | Status: AC
Start: 2021-10-15 — End: 2021-10-15
  Administered 2021-10-15: 1 mg via INTRAVENOUS
  Filled 2021-10-15: qty 1

## 2021-10-15 MED ORDER — LORAZEPAM 2 MG/ML PO CONC
1.0000 mg | ORAL | Status: DC | PRN
  Filled 2021-10-15: qty 0.5

## 2021-10-15 MED ORDER — POLYVINYL ALCOHOL 1.4 % OP SOLN
1.0000 [drp] | Freq: Four times a day (QID) | OPHTHALMIC | Status: DC | PRN
  Filled 2021-10-15: qty 15

## 2021-10-15 MED ORDER — LORAZEPAM 2 MG/ML IJ SOLN: 1.0000 mg | INTRAMUSCULAR | Status: DC | PRN

## 2021-10-15 MED ORDER — ONDANSETRON 4 MG PO TBDP: 4.0000 mg | ORAL_TABLET | Freq: Four times a day (QID) | ORAL | Status: DC | PRN

## 2021-10-15 MED ORDER — ACETAMINOPHEN 325 MG PO TABS: 650.0000 mg | ORAL_TABLET | Freq: Four times a day (QID) | ORAL | Status: DC | PRN

## 2021-10-15 MED ORDER — LORAZEPAM 2 MG/ML PO CONC
1.0000 mg | Freq: Four times a day (QID) | ORAL | Status: DC
  Administered 2021-10-15 (×2): 1 mg via SUBLINGUAL
  Filled 2021-10-15: qty 0.5
  Filled 2021-10-15: qty 1

## 2021-10-15 MED ORDER — HALOPERIDOL LACTATE 5 MG/ML IJ SOLN: 2.0000 mg | Freq: Four times a day (QID) | INTRAMUSCULAR | Status: DC | PRN

## 2021-10-15 MED ORDER — LORAZEPAM 1 MG PO TABS: 1.0000 mg | ORAL_TABLET | ORAL | Status: DC | PRN

## 2021-10-15 MED ORDER — GLYCOPYRROLATE 1 MG PO TABS
1.0000 mg | ORAL_TABLET | ORAL | Status: DC | PRN
  Filled 2021-10-15: qty 1

## 2021-10-15 MED ORDER — ACETAMINOPHEN 650 MG RE SUPP: 650.0000 mg | Freq: Four times a day (QID) | RECTAL | Status: DC | PRN

## 2021-10-15 MED ORDER — LORAZEPAM 2 MG/ML IJ SOLN
1.0000 mg | INTRAMUSCULAR | Status: DC | PRN
  Administered 2021-10-15: 1 mg via INTRAVENOUS
  Filled 2021-10-15: qty 1

## 2021-10-15 NOTE — Progress Notes (Signed)
Attempting to give patient morning medications with son, [name], present in the room.  Patient becomes increasingly delirious, combative and agitated - refusing all medications at this time.  Patient begins to continuously yell and pull off his oxygen, leads, gown, and anything else he could grab. Patient swings at anyone who attempts to help him and refuses repositioning. Dr Grandville Silos was notified and a one time, 1mg  dose of Ativan was ordered and given. Patient is resting calmly and all monitoring equipment is in place. Arianna Delsanto C 12:43 PM

## 2021-10-15 NOTE — Discharge Instructions (Addendum)
Orthopedic discharge instructions  Diet: As you were doing prior to hospitalization   Shower:  May shower but keep the wounds dry, use an occlusive plastic wrap, NO SOAKING IN TUB.  Left hip Aquacel dressing is waterproof.  Dressing: Leave left hip Aquacel dressing intact for 2 weeks.  After that can be removed and incision left open to air.  Okay to shower and get incision wet.  There may be some surgical glue and dried blood.  Do not scrub the incision but let the glue flake off on its own.  Activity:  Increase activity slowly as tolerated, but follow the weight bearing instructions below.  The rules on driving is that you can not be taking narcotics while you drive, and you must feel in control of the vehicle.    Weight Bearing:   weight bearing as tolerated left leg. Non weight bearing left arm.     Colace (over the counter) 100 mg by mouth twice a day  Drink plenty of fluids (prune juice may be helpful) and high fiber foods Miralax (over the counter) for constipation as needed.    Itching:  If you experience itching with your medications, try taking only a single pain pill, or even half a pain pill at a time.  You may take up to 10 pain pills per day, and you can also use benadryl over the counter for itching or also to help with sleep.   Precautions:  If you experience chest pain or shortness of breath - call 911 immediately for transfer to the hospital emergency department!!   Call office [phone]) for the following: Temperature greater than 101F Persistent nausea and vomiting Severe uncontrolled pain Redness, tenderness, or signs of infection (pain, swelling, redness, odor or green/yellow discharge around the site) Difficulty breathing, headache or visual disturbances Hives Persistent dizziness or light-headedness Extreme fatigue Any other questions or concerns you may have after discharge  In an emergency, call 911 or go to an Emergency Department at a nearby  hospital  Follow- Up Appointment:  Please call for an appointment to be seen approximately 2-3 week after surgery in [facility] with your surgeon Dr. Charlies Constable - [phone] Address: [address] [place], [place], [place] [address]

## 2021-10-15 NOTE — TOC Progression Note (Signed)
Transition of Care [facility]) - Progression Note    Patient Details  Name: Samuel [name] MRN: [id number] Date of Birth: [birth year]  [facility]) CM/SW Contact  Ella Bodo, RN Phone Number: 10/15/2021, 10:26 AM  Clinical Narrative:    Spoke with patient's granddaughter, Samuel [name], by phone regarding current bed offers for SNF.  Patient has two bed offers in [place]; spoke with son yesterday, and he states  would be closer to family.  Nikki appreciative of information; will follow up with any additional offers.    Expected Discharge Plan: [place] Barriers to Discharge: Continued Medical Work up  Expected Discharge Plan and Services Expected Discharge Plan: [place]   Discharge Planning Services: CM Consult Post Acute Care Choice: [place] Living arrangements for the past 2 months: Single Family Home                                       Social Determinants of Health (SDOH) Interventions    Readmission Risk Interventions No flowsheet data found.  Reinaldo Raddle, RN, BSN  Trauma/Neuro ICU Case Manager [phone]

## 2021-10-15 NOTE — Progress Notes (Signed)
Trauma Event Note    TRN rounded on patient during Epic downtime. Confused, but redirectable. Pending palliative consult later today. Pt stable at this time, VS WDL. Checked in with primary nurse at bedside, no needs at this time.   Last imported Vital Signs BP (!) 112/56    Pulse 77    Temp 98.2 F (36.8 C) (Axillary)    Resp 12    Ht 5\' 4"  (1.626 m)    Wt 147 lb 0.8 oz (66.7 kg)    SpO2 96%    BMI 25.24 kg/m   Trending CBC Recent Labs    11/03/2021 0457 10/14/21 0711 10/14/21 1316  WBC 15.8* 13.7* 14.8*  HGB 8.8* 6.7* 8.1*  HCT 26.6* 20.0* 24.4*  PLT 131* 109* 106*    Trending Coag's No results for input(s): APTT, INR in the last 72 hours.  Trending BMET Recent Labs    10/15/2021 1223 10/15/2021 0457 10/14/21 0711  NA 142 144 142  K 4.8 4.2 4.4  CL 108 109 114*  CO2 19* 19* 17*  BUN 35* 41* 39*  CREATININE 3.19* 3.18* 2.84*  GLUCOSE 158* 184* 147*      Hayato Guaman O Mihailo Sage  Trauma Response RN  Please call TRN at [phone] for further assistance.

## 2021-10-15 NOTE — TOC Progression Note (Signed)
Transition of Care [facility]) - Progression Note    Patient Details  Name: ZAQUAN DUFFNER MRN: [id number] Date of Birth: 05-Jul-1930  Transition of Care [facility]) CM/SW Contact  Oren Section Cleta Alberts, RN Phone Number: 10/15/2021, 4:47 PM  Clinical Narrative:    Family meeting with Palliative Medicine Team held this afternoon.  Family has chosen to offer full comfort measures for patient, with plan to dc home with Hospice services.  Family prefers [place]; referral called in to [place] with Hospice agency.  Beatrix Fetters to coordinate needed DME and admission services.  She is aware that family would like him home as soon as possible, pending delivery of equipment; he will need oxygen and hospital bed, per PMT Nurse Practitioner, Amber.  TOC Case Manager available to assist as needed with arrangements.    Expected Discharge Plan: Home w Hospice Care Barriers to Discharge: Continued Medical Work up  Expected Discharge Plan and Services Expected Discharge Plan: [place]   Discharge Planning Services: CM Consult Post Acute Care Choice: Hospice Living arrangements for the past 2 months: Single Family Home                           HH Arranged: RN, Social Work [facility] Agency: [place] Date [place]: 10/15/21 Time [place]: 1630 Representative spoke with at [place]: [place] ([place]) Interventions    Readmission Risk Interventions No flowsheet data found.  Reinaldo Raddle, RN, BSN  Trauma/Neuro ICU Case Manager [phone]

## 2021-10-15 NOTE — Progress Notes (Signed)
Progress Note   Patient: Samuel [name] NIO:[id number] DOB: Jun 10, 1930 DOA: 10/18/2021     3 DOS: the patient was seen and examined on 10/15/2021   Brief hospital course: 88 y.o. male with medical history significant of BPH, CKD, DM, dementia (still driving with supervising family), HTN, and HLD presenting with a fall. The morning of admission, the patient was thrashing about in bed and his wife was concerned that he was having a "diabetic seizure."  He has reportedly had one prior.  He is no longer on insulin and just "sometimes" takes a pill.  This morning, his wife went to get him a "sugar pill" and then when that didn't help she went to get him another.   While she was gone, he got out of the bed and fell. At baseline he ambulates without assistance, family has occasional caregiver.  Assessment and Plan: Anemia S/p 1 unit PRBC -repeat h/h improved  Multifocal atrial tachycardia (HCC) -telemetry irregular, A fib vs MAT? Obtain 12 lead, continue to monitor. Low dose Metoprolol. Unable to anticoagulate due to [place]  BPH (benign prostatic hyperplasia) -continue flomax  Left displaced femoral neck fracture ([place]) -management per trauma team  DNR (do not resuscitate)- (present on admission) -confirmed with son by admitting -palliative care following-- meeting planned for today  CKD (chronic kidney disease) stage 4, GFR 15-29 ml/min ([place])- (present on admission) -most recent Cr 2.77 in 2020.  -improved to 2.84 -avoid nephrotoxic agents  Humerus fracture- (present on admission) -Ortho has consulted, sling recommended, non-surgical  Fall at home, initial encounter -on trauma service due to multiple fractures, ICH  ICH (intracerebral hemorrhage) ([place])- (present on admission) -per neurosurgery, on Keppra, Depakote  Moderate dementia with behavioral disturbance- (present on admission) -Patient with moderate to advanced dementia, often does not recognize his son. MMSE 15/30 during 04/2021  neurology appointment. Continue home meds for behavorial issues - remeron, Namenda, Aricept, Depakote -ativan may be worsening-- could do trial of haldol (Qtc ok)  Type 2 diabetes mellitus without complication, with long-term current use of insulin ([place]) -Patient was taken off insulin, currently on Amaryl and Januvia at home. Hold oral meds, cover with sensitive-scale SSI    Other hyperlipidemia- (present on admission) -Continue Lipitor  Essential hypertension- (present on admission) -d/c norvasc     Poor overall prognosis-- appears to be worsening today-- family to meet with palliative care today   Subjective: appears to have gotten ativan this AM (was very sleepy when I saw him-- minimally interactive)  Physical Exam: Vitals:   10/15/21 0900 10/15/21 1000 10/15/21 1147 10/15/21 1200  BP: (!) 125/52 (!) 139/49 (!) 115/47 (!) 106/47  Pulse: 81 90 71 77  Resp: (!) 24  17 18   Temp:    97.8 F (36.6 C)  TempSrc:    Axillary  SpO2: 96% (!) 84% 98% 98%  Weight:      Height:        General: Appearance:     Frail male who appear uncomfortable     Lungs:     respirations unlabored  Heart:    Normal heart rate.    MS:   All extremities are intact.    Neurologic:   Sleepy- got ativan this AM     Data Reviewed: Appears to be having a worsening acidosis  Family Communication: none at bedside  Disposition: Status is: Inpatient Remains inpatient appropriate because: ill          Time spent: 45 minutes  Author: Geradine Girt,  DO 10/15/2021 1:05 PM  For on call review [email or web address].

## 2021-10-15 NOTE — Progress Notes (Signed)
Daily Progress Note   Patient Name: Samuel [name]       Date: 10/15/2021 DOB: 10-22-1929  Age: 88 y.o. MRN#: [id number] Attending Physician: Particia Jasper, MD Primary Care Physician: Haywood Pao, MD Admit Date: 10/18/2021  Reason for Consultation/Follow-up: Establishing goals of care  Subjective: Chart review performed. Received report from primary RN - no acute concerns. Overall RN is concerned about patient's continued decline - he is not eating/drinking, refusing medications, combative, agitated, has acute urinary retention.   Went to visit patient at bedside - no family/visitors present. Patient was lying in bed asleep - he is minimally responsive - he did receive a dose of ativan earlier. No signs or non-verbal gestures of pain or discomfort noted. No respiratory distress, increased work of breathing, or secretions noted.   3:00 PM Called family for schedule meeting for continued GOC. Family members present via speakerphone were: Del/son, Lela/wife, Nikki/granddaughter, [name]/granddaughter, [name]/DIL, Theatre manager, [name]/son.  Emotional support provided. Allowed space and opportunity for family to express thoughts and feelings as well as what they've witnessed during their visits. Family report also seeing patient agitated/combative, delirious, not eating/drinking or taking meds despite family/medical staff efforts.   Prognostication was reviewed with family - they were understandably tearful. Encouraged family to consider how patient would want to spend his last days to weeks - at home with family vs rehab with high risk of rehospitalization. Family are clear they would want him to be home.  Provided education and counseling at length on the philosophy and benefits of hospice care.  Discussed that it offers a holistic approach to care in the setting of end-stage illness, and is about supporting the patient where they are allowing nature to take it's course. Discussed the hospice team includes RNs, physicians, social workers, and chaplains. They can provide personal care, support for the family, and help keep patient out of the hospital as well as assist with DME needs for home hospice. Education provided on the difference between home vs residential hospice. Family would prefer the patient return home with hospice - discussed private duty caregivers support. Reviewed discharge process - family would need DME in the home and request discharge after DME is delivered.   Symptom management at EOL was reviewed in detail. Natural trajectory at EOL was reviewed.   We talked about transition  to comfort measures in house and what that would entail inclusive of medications to control pain, dyspnea, agitation, nausea, and itching. We discussed stopping all unnecessary measures such as blood draws, needle sticks, oxygen, antibiotics, CBGs/insulin, cardiac monitoring, IVF, and frequent vital signs. Family are agreeable for transition to full comfort care in house today - will continue oxygen with goal to keep patient stable for discharge home, will not escalate.   Current [facility] E[facility] visitation policy reviewed.  Family expressed appreciation for call today.   All questions and concerns addressed. Encouraged to call with questions and/or concerns. PMT card previously provided.  Reviewed symptom management plan with primary RN.   Length of Stay: 3  Current Medications: Scheduled Meds:   antiseptic oral rinse  15 mL Topical BID   LORazepam  1 mg Sublingual Q6H    Continuous Infusions:   PRN Meds: acetaminophen **OR** acetaminophen, glycopyrrolate **OR** glycopyrrolate **OR** glycopyrrolate, haloperidol **OR** haloperidol **OR** haloperidol lactate, HYDROmorphone HCl, ipratropium,  LORazepam **OR** LORazepam **OR** LORazepam, ondansetron **OR** ondansetron (ZOFRAN) IV, polyvinyl alcohol  Physical Exam Vitals and nursing note reviewed.  Constitutional:      General: He is not in acute distress.    Appearance: He is ill-appearing.  Pulmonary:     Effort: No respiratory distress.  Skin:    General: Skin is warm and dry.  Neurological:     Mental Status: He is lethargic and confused.     Motor: Weakness present.            Vital Signs: BP 130/66    Pulse (!) 153    Temp 97.8 F (36.6 C) (Axillary)    Resp 14    Ht 5\' 4"  (1.626 m)    Wt 66.7 kg    SpO2 100%    BMI 25.24 kg/m  SpO2: SpO2: 100 % O2 Device: O2 Device: Nasal Cannula O2 Flow Rate: O2 Flow Rate (L/min): 3 L/min  Intake/output summary:  Intake/Output Summary (Last 24 hours) at 10/15/2021 1718 Last data filed at 10/15/2021 1600 Gross per 24 hour  Intake 1941.74 ml  Output 890 ml  Net 1051.74 ml   LBM: Last BM Date:  (PTA) Baseline Weight: Weight: 66.7 kg Most recent weight: Weight: 66.7 kg       Palliative Assessment/Data: PPS 10%      Patient Active Problem List   Diagnosis Date Noted   Anemia 10/14/2021   Left displaced femoral neck fracture ([place]) 10/15/2021   BPH (benign prostatic hyperplasia) 10/26/2021   Multifocal atrial tachycardia ([place]) 10/19/2021   ICH (intracerebral hemorrhage) ([place]) 10/08/2021   Fall at home, initial encounter 10/29/2021   Humerus fracture 10/24/2021   CKD (chronic kidney disease) stage 4, GFR 15-29 ml/min ([place]) 10/21/2021   DNR (do not resuscitate) 10/23/2021   Moderate dementia with behavioral disturbance 04/14/2021   Mild cognitive impairment 08/05/2016   Essential hypertension 08/05/2016   Other hyperlipidemia 08/05/2016   Type 2 diabetes mellitus without complication, with long-term current use of insulin ([place]) 08/05/2016    Palliative Care Assessment & Plan   Patient Profile: 88 y.o. male  with past medical history of BPH, stage 3b CKD, DM,  Alzheimer's dementia, HTN, and HLD presented to ED on 10/28/2021 from home after wife witnessed syncopal event vs seizure with fall. He lost consciousness for several minutes. Neurosurgery was consulted who recommended patient be admitted for seizure work up. Orthopedics were consulted and recommended non-surgical treatment with sling, NWB, and outpatient follow up. Patient was admitted on 10/08/2021  with fall at home, CKD stage 4, humerus fracture, intracranial hemorrhage.   Assessment: Left femoral neck fracture Left proximal humerus fracture Fall at home Multifactorial tachycardia CKD stage 4 ICH Moderate dementia with behavioral disturbance Terminal care  Recommendations/Plan: Initiated full comfort measures Continue DNR/DNI as previously documented Discharge patient home with hospice after DME is delivered [facility] notified and consult placed for: home hospice referral - family requesting [facility] Added orders for EOL symptom management and to reflect full comfort measures, as well as discontinued orders that were not focused on comfort Insert foley catheter for EOL comfort/acute urinary retention Continue oxygen at this time for goal to keep patient stable to return home - do not escalate Unrestricted visitation orders were placed per current [facility] EOL visitation policy  Nursing to provide frequent assessments and administer PRN medications as clinically necessary to ensure EOL comfort PMT will continue to follow and support holistically   Symptom Management: Scheduled ativan sublingual q6h for seizure prophylaxis as well as for management of anxiety Dilaudid liquid PRN pain Zofran PRN nausea Robinul PRN respiratory secretions Haldol PRN agitation Ativan PRN anxiety/seizure/sedation/distress Zofran PRN nausea Liquifilm tears PRN dry eyes Biotene BID mouth care Tylenol PRN pain/fever   Goals of Care and Additional Recommendations: Limitations on Scope of  Treatment: Full Comfort Care  Code Status:    Code Status Orders  (From admission, onward)           Start     Ordered   10/15/21 1555  Do not attempt resuscitation (DNR)  Continuous       Question Answer Comment  In the event of cardiac or respiratory ARREST Do not call a code blue   In the event of cardiac or respiratory ARREST Do not perform Intubation, CPR, defibrillation or ACLS   In the event of cardiac or respiratory ARREST Use medication by any route, position, wound care, and other measures to relive pain and suffering. May use oxygen, suction and manual treatment of airway obstruction as needed for comfort.      10/15/21 1634           Code Status History     Date Active Date Inactive Code Status Order ID Comments User Context   10/27/2021 [id number] 10/15/2021 1634 DNR [id number]  Karmen Bongo, MD ED   10/09/2021 1515 10/28/2021 1549 Full Code [id number]  Jesusita Oka, MD ED       Prognosis:  < 2 weeks  Discharge Planning: Home with Hospice  Care plan was discussed with primary RN, patient's family, TOC, Dr. Eliseo Squires  Thank you for allowing the Palliative Medicine Team to assist in the care of this patient.   Total Time 90 minutes Prolonged Time Billed  yes       Greater than 50%  of this time was spent counseling and coordinating care related to the above assessment and plan.  Lin Landsman, NP  Please contact Palliative Medicine Team phone at [phone] for questions and concerns.

## 2021-10-15 NOTE — Progress Notes (Addendum)
Patient ID: RAEFORD BRANDENBURG, male   DOB: 11/28/1929, 88 y.o.   MRN: [id number] 2 Days Post-Op    Subjective: Just ate with assistance, says don't hurt me when checking LUE ROS negative except as listed above. Objective: Vital signs in last 24 hours: Temp:  [96.1 F (35.6 C)-98.5 F (36.9 C)] 96.1 F (35.6 C) (02/09 0800) Pulse Rate:  [70-89] 77 (02/09 0600) Resp:  [9-24] 12 (02/09 0600) BP: (105-145)/(42-87) 112/56 (02/09 0600) SpO2:  [86 %-100 %] 96 % (02/09 0600) Last BM Date:  (PTA)  Intake/Output from previous day: 02/08 0701 - 02/09 0700 In: 2242.6 [P.O.:60; I.V.:1697.6; Blood:285; IV Piggyback:200] Out: 1300 [Urine:1300] Intake/Output this shift: No intake/output data recorded.  General appearance: no distress Head: L periorbital ecchymoses Resp: clear to auscultation bilaterally Cardio: irregularly irregular rhythm GI: soft, non-tender; bowel sounds normal; no masses,  no organomegaly Extremities: L hip dressing, LUE dressing changed, FA and arm large skin tears, no cellulitis Neurologic: Mental status: not oriented but does follow some commands  Lab Results: CBC  Recent Labs    10/14/21 1316 10/15/21 0230  WBC 14.8* 13.0*  HGB 8.1* 7.8*  HCT 24.4* 24.2*  PLT 106* 94*   BMET Recent Labs    10/14/21 0711 10/15/21 0230  NA 142 145  K 4.4 4.8  CL 114* 114*  CO2 17* 14*  GLUCOSE 147* 171*  BUN 39* 41*  CREATININE 2.84* 2.90*  CALCIUM 7.7* 7.8*   PT/INR No results for input(s): LABPROT, INR in the last 72 hours. ABG No results for input(s): PHART, HCO3 in the last 72 hours.  Invalid input(s): PCO2, PO2  Studies/Results: CT HEAD WO CONTRAST (5MM)  Result Date: 10/28/2021 CLINICAL DATA:  Mental status change, intracranial hemorrhage follow-up EXAM: CT HEAD WITHOUT CONTRAST TECHNIQUE: Contiguous axial images were obtained from the base of the skull through the vertex without intravenous contrast. RADIATION DOSE REDUCTION: This exam was performed  according to the departmental dose-optimization program which includes automated exposure control, adjustment of the mA and/or kV according to patient size and/or use of iterative reconstruction technique. COMPARISON:  10/15/2021 FINDINGS: Brain: Small well-defined focus of parenchymal hemorrhage within the superior right temporal lobe likely reflecting evolving contusion. Increased area of low density in the left frontal lobe (series 3, image 21) probably reflects an additional evolving contusion rather than acute infarction. Scattered sulcal subarachnoid hemorrhage again identified. Small volume hemorrhage layering within the occipital horns. Increased prominence of the left greater than right convexity extra-axial space may reflect thin subdural hygromas. Small left frontal convexity acute subdural blood products remain present. Stable findings of probable chronic microvascular ischemic changes in the cerebral white matter. Chronic small vessel infarcts of the corona radiata and basal ganglia bilaterally. Vascular: There is atherosclerotic calcification at the skull base. Skull: Calvarium is unremarkable. Sinuses/Orbits: No acute finding. Other: None. IMPRESSION: Evolving small right temporal magic contusion. Increased focal low-density in the left frontal lobe probably reflects additional evolving contusion rather than acute infarction. Small volume subarachnoid hemorrhage is again identified. Small volume left frontal convexity acute subdural hemorrhage as before. Increased prominence of left greater than right convexity extra-axial space may reflect thin subdural hygromas. Electronically Signed   By: Macy Mis M.D.   On: 11/02/2021 10:47   DG HIP OPERATIVE UNILAT W OR W/O PELVIS LEFT  Result Date: 10/17/2021 CLINICAL DATA:  Postop hip surgery EXAM: OPERATIVE left HIP (WITH PELVIS IF PERFORMED) 2 VIEWS TECHNIQUE: Fluoroscopic spot image(s) were submitted for interpretation post-operatively. COMPARISON:  10/28/2021 FINDINGS: Interval left hip replacement with intact hardware and normal alignment. Gas in the soft tissues consistent with recent surgery. IMPRESSION: Interval left hip replacement with expected postsurgical changes Electronically Signed   By: Donavan Foil M.D.   On: 10/24/2021 20:02    Anti-infectives: Anti-infectives (From admission, onward)    Start     Dose/Rate Route Frequency Ordered Stop   10/14/21 0745  ceFAZolin (ANCEF) IVPB 1 g/50 mL premix        1 g 100 mL/hr over 30 Minutes Intravenous Every 24 hours 10/14/21 0736 10/14/21 0839   10/14/21 0600  ceFAZolin (ANCEF) IVPB 2g/100 mL premix  Status:  Discontinued        2 g 200 mL/hr over 30 Minutes Intravenous On call to O.R. 10/27/2021 1500 10/26/2021 1957   10/17/2021 1502  ceFAZolin (ANCEF) 2-4 GM/100ML-% IVPB       Note to Pharmacy: Marga Melnick C: cabinet override      10/22/2021 1502 10/14/21 0314       Assessment/Plan: Fall  Multifocal ICH - NSGY c/s, Dr. Ronnald Ramp. F/U CT H done 2/7. They have S.O. Left humeral fracture -Per Dr. Doreatha Martin, nonop in a sling. Left femoral neck fracture - Ortho c/s, s/p  L hemiarthroplasty by Dr. Zachery Dakins 2/7 Left facial ecchymosis LUE skin tears - xeroform dressing change daily Type 2 diabetes mellitus -appreciate TRH CKD stage 4 - appreciate TRH HTN - appreciate TRH Multifocal atrial tachycardia - appreciate TRH BPH - flomax (patient refusing so has needed I&O) Baseline dementia -resume home meds CKD -follow HTN -monitor ABLA - post-op, txf 1u pRBC 2/8, Hb 7.8 FEN - soft diet VTE - SCDs, D/C LMWH as PLTs under 100k Dispo - to 4NP, Palliative Care to meet with family today at 1500. Advanced medical decision making  LOS: 3 days    Georganna Skeans, MD, MPH, FACS Trauma & General Surgery Use [email or web address] to contact on call provider  10/15/2021

## 2021-10-15 NOTE — Progress Notes (Signed)
° ° ° °  Subjective:  Patient lying comfortably in bed this morning.  Unable to give history or answer questions due to dementia.  Patient worked with physical therapy yesterday which was limited based on patient's cognitive status and multiple extremity injuries.  Worked on transfers and bed mobility with the therapists.  Per nursing, patient has been objecting to taking meds.  Also discussed with nursing possible wound care consult for the left arm skin tears.  Palliative care also involved.  Objective:   VITALS:   Vitals:   10/15/21 0300 10/15/21 0400 10/15/21 0500 10/15/21 0600  BP: (!) 113/55 109/69 124/84 (!) 112/56  Pulse: 73 77 72 77  Resp: 15 13 14 12   Temp:      TempSrc:      SpO2: 98% 98% 91% 96%  Weight:      Height:        Incision: dressing C/D/I Compartment soft No significant swelling about the left hip Moves foot and ankle spontaneously  Left arm with Xeroform and Kerlix dressing for the skin tears. Mild swelling about the left shoulder.  Lab Results  Component Value Date   WBC 13.0 (H) 10/15/2021   HGB 7.8 (L) 10/15/2021   HCT 24.2 (L) 10/15/2021   MCV 98.8 10/15/2021   PLT 94 (L) 10/15/2021   BMET    Component Value Date/Time   NA 145 10/15/2021 0230   K 4.8 10/15/2021 0230   CL 114 (H) 10/15/2021 0230   CO2 14 (L) 10/15/2021 0230   GLUCOSE 171 (H) 10/15/2021 0230   BUN 41 (H) 10/15/2021 0230   CREATININE 2.90 (H) 10/15/2021 0230   CALCIUM 7.8 (L) 10/15/2021 0230   GFRNONAA 20 (L) 10/15/2021 0230      Xray: X-rays left hip demonstrate cemented hip hemiarthroplasty in good position without adverse features.  Assessment/Plan: 2 Days Post-Op   Active Problems:   Essential hypertension   Other hyperlipidemia   Type 2 diabetes mellitus without complication, with long-term current use of insulin (HCC)   Moderate dementia with behavioral disturbance   ICH (intracerebral hemorrhage) ([place])   Fall at home, initial encounter   Humerus fracture    CKD (chronic kidney disease) stage 4, GFR 15-29 ml/min (HCC)   DNR (do not resuscitate)   Left displaced femoral neck fracture (HCC)   BPH (benign prostatic hyperplasia)   Multifocal atrial tachycardia (HCC)   Anemia  Left hip hemiarthroplasty for femoral neck fracture on 10/23/2021 Nonop treatment left proximal humerus fracture.  Sling for comfort to the left upper extremity.  Skin tears to the left forearm and arm.  Consider wound care consult for management.  Post op recs: WB: WBAT LLE, NWB LUE in sling for comfort Abx: ancef x23 hours post op Imaging: PACU xrays Dressing: Aquacel dressing to be kept intact for 2 weeks.  No sutures or staples to remove.  Okay to shower and get incision wet after dressing removed. DVT prophylaxis: lovenox starting POD1 x4 weeks Follow up: 2 weeks after surgery for a wound check with Dr. Zachery Dakins at [facility].  Address: [address] [place], [place], [place] [address]  Office Phone: [phone]    Willaim Sheng 10/15/2021, 7:25 AM   Charlies Constable, MD  Contact information:   [phone] 7am-5pm epic message Dr. Zachery Dakins, or call office for patient follow up: [phone] After hours and holidays please check [email or web address] for group call information for [facility]

## 2021-10-16 LAB — BPAM RBC
Blood Product Expiration Date: 202303012359
Blood Product Expiration Date: 202303012359
ISSUE DATE / TIME: 202302080857
Unit Type and Rh: 6200
Unit Type and Rh: 6200

## 2021-10-16 LAB — TYPE AND SCREEN
ABO/RH(D): A POS
Antibody Screen: NEGATIVE
Unit division: 0
Unit division: 0

## 2021-11-04 NOTE — Progress Notes (Signed)
[facility] contacted, pt is not a donor candidate per [employer]. Reference#[id number].

## 2021-11-04 NOTE — Progress Notes (Signed)
Pt noted to be without pulse, non breathing, without signs of life. Pronounced deceased by two RN.

## 2021-11-04 NOTE — Progress Notes (Signed)
Chaplain was paged to for family supports as patient had died.  Chaplain arrived and 1 son present, others arrived.  Family began to share stories [place] and giving thanks for the life lessons he taught them all.  Patient's spouse and other relatives arrived as well.  Patient and spouse have been married 25 years and she was heartbroken.  Chaplain provided ministry of presence, words of comfort and support as well as empathetic listening.  Chaplain prayed with the family and offered space for family to participate.  Chaplain gave eldest son, Zoe, Nordin a patient placement card and the family was going to discuss funeral home before they leave.  Chaplain available for support s needed. [place], Mdiv.    10-28-2021 0200  Clinical Encounter Type  Visited With Patient and family together;Health care provider  Visit Type Death  Referral From Nurse;Family  Consult/Referral To Chaplain  Spiritual Encounters  Spiritual Needs Prayer;Emotional;Grief support

## 2021-11-04 NOTE — Progress Notes (Signed)
Dilaudid 2 ml liquid wasted by this nurse, witnessed by [employer].

## 2021-11-04 DEATH — deceased

## 2021-12-05 NOTE — Death Summary Note (Signed)
DEATH SUMMARY   Patient Details  Name: Samuel [name] MRN: [id number] DOB: 1930-02-29  Admission/Discharge Information   Admit Date:  18-Oct-2021  Date of Death: Date of Death: 10-22-21  Time of Death: Time of Death: Dec 10, 2022  Length of Stay: 4  Referring Physician: Haywood Pao, MD   Reason(s) for Hospitalization  Ground level fall with TBI, L humerus FX, L femoral neck FX  Diagnoses  Preliminary cause of death:  Secondary Diagnoses (including complications and co-morbidities):  Active Problems:   Essential hypertension   Other hyperlipidemia   Type 2 diabetes mellitus without complication, with long-term current use of insulin (HCC)   Moderate dementia with behavioral disturbance   ICH (intracerebral hemorrhage) ([place])   Fall at home, initial encounter   Humerus fracture   CKD (chronic kidney disease) stage 4, GFR 15-29 ml/min (HCC)   DNR (do not resuscitate)   Left displaced femoral neck fracture (HCC)   BPH (benign prostatic hyperplasia)   Multifocal atrial tachycardia (HCC)   Anemia   Brief Hospital Course (including significant findings, care, treatment, and services provided and events leading to death)  Samuel [name] is a 88 y.o. year old male who was admitted after a ground level fall. Injuries included TBI, L humerus FX, L femoral neck FX. In light of his age and comorbidities, Palliative Care followed him after admission. He underwent L hip hemiarthroplasty by Dr. Zachery Dakins 10/23/2021. He was transfused 1u PRBC on 10/14/21. Palliative Care met with the family and he was DNR. Plan was for hospice placement. Patient was transitioned to comfort care level and he expired peacefully.    Pertinent Labs and Studies  Significant Diagnostic Studies No results found.  Microbiology No results found for this or any previous visit (from the past 240 hour(s)).  Lab Basic Metabolic Panel: No results for input(s): NA, K, CL, CO2, GLUCOSE, BUN, CREATININE, CALCIUM, MG,  PHOS in the last 168 hours. Liver Function Tests: No results for input(s): AST, ALT, ALKPHOS, BILITOT, PROT, ALBUMIN in the last 168 hours. No results for input(s): LIPASE, AMYLASE in the last 168 hours. No results for input(s): AMMONIA in the last 168 hours. CBC: No results for input(s): WBC, NEUTROABS, HGB, HCT, MCV, PLT in the last 168 hours. Cardiac Enzymes: No results for input(s): CKTOTAL, CKMB, CKMBINDEX, TROPONINI in the last 168 hours. Sepsis Labs: No results for input(s): PROCALCITON, WBC, LATICACIDVEN in the last 168 hours.  Procedures/Operations   L hip hemiarthroplasty by Dr. Zachery Dakins 10/15/2021   Zenovia Jarred 11/16/2021, 6:41 AM
